# Patient Record
Sex: Female | Born: 1935 | Race: Black or African American | Hispanic: No | Marital: Single | Smoking: Never smoker
Health system: Southern US, Community
[De-identification: ages and names within clinical notes are randomized; demographics above are authoritative.]

## PROBLEM LIST (undated history)

## (undated) DIAGNOSIS — M810 Age-related osteoporosis without current pathological fracture: Secondary | ICD-10-CM

## (undated) DIAGNOSIS — C801 Malignant (primary) neoplasm, unspecified: Secondary | ICD-10-CM

## (undated) DIAGNOSIS — I10 Essential (primary) hypertension: Secondary | ICD-10-CM

## (undated) DIAGNOSIS — M1990 Unspecified osteoarthritis, unspecified site: Secondary | ICD-10-CM

## (undated) DIAGNOSIS — M25569 Pain in unspecified knee: Secondary | ICD-10-CM

## (undated) DIAGNOSIS — E785 Hyperlipidemia, unspecified: Secondary | ICD-10-CM

## (undated) DIAGNOSIS — D509 Iron deficiency anemia, unspecified: Secondary | ICD-10-CM

## (undated) DIAGNOSIS — K219 Gastro-esophageal reflux disease without esophagitis: Secondary | ICD-10-CM

## (undated) DIAGNOSIS — D1721 Benign lipomatous neoplasm of skin and subcutaneous tissue of right arm: Secondary | ICD-10-CM

## (undated) DIAGNOSIS — C189 Malignant neoplasm of colon, unspecified: Principal | ICD-10-CM

## (undated) HISTORY — PX: COLON SURGERY: SHX602

## (undated) HISTORY — DX: Pain in unspecified knee: M25.569

## (undated) HISTORY — DX: Benign lipomatous neoplasm of skin and subcutaneous tissue of right arm: D17.21

## (undated) HISTORY — DX: Iron deficiency anemia, unspecified: D50.9

## (undated) HISTORY — PX: CATARACT EXTRACTION: SUR2

## (undated) HISTORY — PX: OTHER SURGICAL HISTORY: SHX169

## (undated) HISTORY — PX: APPENDECTOMY: SHX54

## (undated) HISTORY — DX: Malignant neoplasm of colon, unspecified: C18.9

## (undated) HISTORY — PX: ABDOMINAL HYSTERECTOMY: SHX81

---

## 2000-04-13 ENCOUNTER — Other Ambulatory Visit: Admission: RE | Admit: 2000-04-13 | Discharge: 2000-04-13 | Payer: Self-pay | Admitting: Family Medicine

## 2000-04-19 ENCOUNTER — Encounter: Payer: Self-pay | Admitting: Family Medicine

## 2000-04-19 ENCOUNTER — Ambulatory Visit (HOSPITAL_COMMUNITY): Admission: RE | Admit: 2000-04-19 | Discharge: 2000-04-19 | Payer: Self-pay | Admitting: Family Medicine

## 2001-03-28 ENCOUNTER — Ambulatory Visit (HOSPITAL_COMMUNITY): Admission: RE | Admit: 2001-03-28 | Discharge: 2001-03-28 | Payer: Self-pay | Admitting: Internal Medicine

## 2001-08-03 ENCOUNTER — Encounter: Payer: Self-pay | Admitting: Family Medicine

## 2001-08-03 ENCOUNTER — Ambulatory Visit (HOSPITAL_COMMUNITY): Admission: RE | Admit: 2001-08-03 | Discharge: 2001-08-03 | Payer: Self-pay | Admitting: Family Medicine

## 2001-12-16 ENCOUNTER — Emergency Department (HOSPITAL_COMMUNITY): Admission: EM | Admit: 2001-12-16 | Discharge: 2001-12-16 | Payer: Self-pay | Admitting: Internal Medicine

## 2001-12-16 ENCOUNTER — Encounter: Payer: Self-pay | Admitting: Internal Medicine

## 2002-01-16 ENCOUNTER — Ambulatory Visit (HOSPITAL_COMMUNITY): Admission: RE | Admit: 2002-01-16 | Discharge: 2002-01-16 | Payer: Self-pay | Admitting: Ophthalmology

## 2002-01-26 ENCOUNTER — Ambulatory Visit (HOSPITAL_COMMUNITY): Admission: RE | Admit: 2002-01-26 | Discharge: 2002-01-26 | Payer: Self-pay | Admitting: General Surgery

## 2002-02-13 ENCOUNTER — Ambulatory Visit (HOSPITAL_COMMUNITY): Admission: RE | Admit: 2002-02-13 | Discharge: 2002-02-13 | Payer: Self-pay | Admitting: Ophthalmology

## 2004-04-23 ENCOUNTER — Emergency Department (HOSPITAL_COMMUNITY): Admission: EM | Admit: 2004-04-23 | Discharge: 2004-04-23 | Payer: Self-pay | Admitting: Emergency Medicine

## 2004-06-08 ENCOUNTER — Emergency Department (HOSPITAL_COMMUNITY): Admission: EM | Admit: 2004-06-08 | Discharge: 2004-06-08 | Payer: Self-pay | Admitting: Emergency Medicine

## 2004-12-20 ENCOUNTER — Emergency Department (HOSPITAL_COMMUNITY): Admission: EM | Admit: 2004-12-20 | Discharge: 2004-12-20 | Payer: Self-pay | Admitting: Emergency Medicine

## 2006-03-27 ENCOUNTER — Emergency Department (HOSPITAL_COMMUNITY): Admission: EM | Admit: 2006-03-27 | Discharge: 2006-03-27 | Payer: Self-pay | Admitting: Emergency Medicine

## 2006-04-04 ENCOUNTER — Ambulatory Visit (HOSPITAL_COMMUNITY): Admission: RE | Admit: 2006-04-04 | Discharge: 2006-04-04 | Payer: Self-pay | Admitting: Orthopaedic Surgery

## 2006-10-07 ENCOUNTER — Ambulatory Visit: Payer: Self-pay | Admitting: Family Medicine

## 2006-10-07 DIAGNOSIS — E785 Hyperlipidemia, unspecified: Secondary | ICD-10-CM

## 2006-10-07 DIAGNOSIS — M129 Arthropathy, unspecified: Secondary | ICD-10-CM | POA: Insufficient documentation

## 2006-10-07 DIAGNOSIS — N318 Other neuromuscular dysfunction of bladder: Secondary | ICD-10-CM | POA: Insufficient documentation

## 2006-10-07 HISTORY — DX: Arthropathy, unspecified: M12.9

## 2006-10-11 ENCOUNTER — Ambulatory Visit (HOSPITAL_COMMUNITY): Admission: RE | Admit: 2006-10-11 | Discharge: 2006-10-11 | Payer: Self-pay | Admitting: Family Medicine

## 2006-10-12 ENCOUNTER — Encounter (INDEPENDENT_AMBULATORY_CARE_PROVIDER_SITE_OTHER): Payer: Self-pay | Admitting: Family Medicine

## 2006-10-13 ENCOUNTER — Telehealth (INDEPENDENT_AMBULATORY_CARE_PROVIDER_SITE_OTHER): Payer: Self-pay | Admitting: *Deleted

## 2006-10-13 LAB — CONVERTED CEMR LAB
ALT: 15 units/L (ref 0–35)
Albumin: 4.2 g/dL (ref 3.5–5.2)
Alkaline Phosphatase: 74 units/L (ref 39–117)
Calcium: 9.4 mg/dL (ref 8.4–10.5)
Chloride: 104 meq/L (ref 96–112)
Cholesterol: 305 mg/dL — ABNORMAL HIGH (ref 0–200)
Eosinophils Absolute: 0.2 10*3/uL (ref 0.0–0.7)
Eosinophils Relative: 4 % (ref 0–5)
Lymphocytes Relative: 40 % (ref 12–46)
MCHC: 31.1 g/dL (ref 30.0–36.0)
Monocytes Relative: 9 % (ref 3–11)
Neutrophils Relative %: 46 % (ref 43–77)
Potassium: 4.5 meq/L (ref 3.5–5.3)
RBC: 4.72 M/uL (ref 3.87–5.11)
RDW: 14.3 % — ABNORMAL HIGH (ref 11.5–14.0)
Total CHOL/HDL Ratio: 4.1
Triglycerides: 116 mg/dL (ref <150)
VLDL: 23 mg/dL (ref 0–40)
WBC: 4.3 10*3/uL (ref 4.0–10.5)

## 2006-10-31 ENCOUNTER — Ambulatory Visit: Payer: Self-pay | Admitting: Family Medicine

## 2006-10-31 LAB — CONVERTED CEMR LAB: HDL goal, serum: 40 mg/dL

## 2006-11-03 ENCOUNTER — Ambulatory Visit (HOSPITAL_COMMUNITY): Admission: RE | Admit: 2006-11-03 | Discharge: 2006-11-03 | Payer: Self-pay | Admitting: Family Medicine

## 2006-11-03 ENCOUNTER — Encounter (INDEPENDENT_AMBULATORY_CARE_PROVIDER_SITE_OTHER): Payer: Self-pay | Admitting: Family Medicine

## 2006-11-03 LAB — CONVERTED CEMR LAB

## 2006-11-04 ENCOUNTER — Telehealth (INDEPENDENT_AMBULATORY_CARE_PROVIDER_SITE_OTHER): Payer: Self-pay | Admitting: *Deleted

## 2006-11-07 ENCOUNTER — Telehealth (INDEPENDENT_AMBULATORY_CARE_PROVIDER_SITE_OTHER): Payer: Self-pay | Admitting: *Deleted

## 2006-12-12 ENCOUNTER — Ambulatory Visit: Payer: Self-pay | Admitting: Family Medicine

## 2006-12-12 DIAGNOSIS — M810 Age-related osteoporosis without current pathological fracture: Secondary | ICD-10-CM

## 2006-12-13 ENCOUNTER — Telehealth (INDEPENDENT_AMBULATORY_CARE_PROVIDER_SITE_OTHER): Payer: Self-pay | Admitting: *Deleted

## 2006-12-13 LAB — CONVERTED CEMR LAB
AST: 20 units/L (ref 0–37)
Albumin: 4.1 g/dL (ref 3.5–5.2)
Alkaline Phosphatase: 76 units/L (ref 39–117)
CO2: 24 meq/L (ref 19–32)
Calcium: 9.2 mg/dL (ref 8.4–10.5)
Creatinine, Ser: 0.68 mg/dL (ref 0.40–1.20)
Total Bilirubin: 0.4 mg/dL (ref 0.3–1.2)
Total Protein: 7.2 g/dL (ref 6.0–8.3)

## 2007-02-02 ENCOUNTER — Ambulatory Visit: Payer: Self-pay | Admitting: Family Medicine

## 2007-03-02 ENCOUNTER — Encounter (INDEPENDENT_AMBULATORY_CARE_PROVIDER_SITE_OTHER): Payer: Self-pay | Admitting: Family Medicine

## 2007-03-03 LAB — CONVERTED CEMR LAB
ALT: 16 units/L (ref 0–35)
Albumin: 4.1 g/dL (ref 3.5–5.2)
Bilirubin, Direct: 0.1 mg/dL (ref 0.0–0.3)
Cholesterol: 215 mg/dL — ABNORMAL HIGH (ref 0–200)
HDL: 77 mg/dL (ref >39)
Indirect Bilirubin: 0.2 mg/dL (ref 0.0–0.9)
VLDL: 19 mg/dL (ref 0–40)

## 2007-04-27 ENCOUNTER — Ambulatory Visit: Payer: Self-pay | Admitting: Family Medicine

## 2007-05-25 ENCOUNTER — Ambulatory Visit: Payer: Self-pay | Admitting: Family Medicine

## 2007-08-01 ENCOUNTER — Telehealth (INDEPENDENT_AMBULATORY_CARE_PROVIDER_SITE_OTHER): Payer: Self-pay | Admitting: *Deleted

## 2007-10-18 ENCOUNTER — Ambulatory Visit: Payer: Self-pay | Admitting: Internal Medicine

## 2007-11-23 ENCOUNTER — Ambulatory Visit: Payer: Self-pay | Admitting: Family Medicine

## 2007-12-04 ENCOUNTER — Ambulatory Visit (HOSPITAL_COMMUNITY): Admission: RE | Admit: 2007-12-04 | Discharge: 2007-12-04 | Payer: Self-pay | Admitting: Family Medicine

## 2007-12-07 ENCOUNTER — Encounter (HOSPITAL_COMMUNITY): Admission: RE | Admit: 2007-12-07 | Discharge: 2008-01-04 | Payer: Self-pay | Admitting: Family Medicine

## 2007-12-08 ENCOUNTER — Ambulatory Visit: Payer: Self-pay | Admitting: Family Medicine

## 2007-12-12 ENCOUNTER — Ambulatory Visit: Payer: Self-pay | Admitting: Family Medicine

## 2007-12-12 ENCOUNTER — Ambulatory Visit (HOSPITAL_COMMUNITY): Admission: RE | Admit: 2007-12-12 | Discharge: 2007-12-12 | Payer: Self-pay | Admitting: Family Medicine

## 2007-12-12 DIAGNOSIS — M25519 Pain in unspecified shoulder: Secondary | ICD-10-CM

## 2008-01-09 ENCOUNTER — Encounter (HOSPITAL_COMMUNITY): Admission: RE | Admit: 2008-01-09 | Discharge: 2008-01-17 | Payer: Self-pay | Admitting: Family Medicine

## 2008-01-19 ENCOUNTER — Ambulatory Visit: Payer: Self-pay | Admitting: Family Medicine

## 2008-01-19 DIAGNOSIS — I10 Essential (primary) hypertension: Secondary | ICD-10-CM | POA: Insufficient documentation

## 2008-01-19 LAB — CONVERTED CEMR LAB
Bilirubin Urine: NEGATIVE
pH: 5

## 2008-01-22 ENCOUNTER — Encounter (INDEPENDENT_AMBULATORY_CARE_PROVIDER_SITE_OTHER): Payer: Self-pay | Admitting: Family Medicine

## 2008-01-22 LAB — CONVERTED CEMR LAB
AST: 21 units/L (ref 0–37)
Albumin: 4 g/dL (ref 3.5–5.2)
Alkaline Phosphatase: 50 units/L (ref 39–117)
BUN: 20 mg/dL (ref 6–23)
Basophils Relative: 1 % (ref 0–1)
CO2: 22 meq/L (ref 19–32)
Chloride: 108 meq/L (ref 96–112)
Glucose, Bld: 82 mg/dL (ref 70–99)
HCT: 40.1 % (ref 36.0–46.0)
Hemoglobin: 12.6 g/dL (ref 12.0–15.0)
Lymphs Abs: 2.4 10*3/uL (ref 0.7–4.0)
Monocytes Absolute: 0.4 10*3/uL (ref 0.1–1.0)
Monocytes Relative: 7 % (ref 3–12)
Neutro Abs: 3.1 10*3/uL (ref 1.7–7.7)
Neutrophils Relative %: 51 % (ref 43–77)
Platelets: 318 10*3/uL (ref 150–400)
RDW: 14.3 % (ref 11.5–15.5)
Total Bilirubin: 0.3 mg/dL (ref 0.3–1.2)
Total CHOL/HDL Ratio: 3.6

## 2008-01-23 ENCOUNTER — Encounter (HOSPITAL_COMMUNITY): Admission: RE | Admit: 2008-01-23 | Discharge: 2008-02-22 | Payer: Self-pay | Admitting: Family Medicine

## 2008-03-01 ENCOUNTER — Ambulatory Visit: Payer: Self-pay | Admitting: Family Medicine

## 2008-03-01 DIAGNOSIS — M48061 Spinal stenosis, lumbar region without neurogenic claudication: Secondary | ICD-10-CM

## 2008-03-01 DIAGNOSIS — K5900 Constipation, unspecified: Secondary | ICD-10-CM | POA: Insufficient documentation

## 2008-03-01 LAB — CONVERTED CEMR LAB
Nitrite: NEGATIVE
Protein, U semiquant: 30
Urobilinogen, UA: 0.2
pH: 5.5

## 2008-03-05 ENCOUNTER — Encounter (INDEPENDENT_AMBULATORY_CARE_PROVIDER_SITE_OTHER): Payer: Self-pay | Admitting: Family Medicine

## 2008-03-14 ENCOUNTER — Encounter (INDEPENDENT_AMBULATORY_CARE_PROVIDER_SITE_OTHER): Payer: Self-pay | Admitting: Family Medicine

## 2008-04-03 ENCOUNTER — Ambulatory Visit: Payer: Self-pay | Admitting: Family Medicine

## 2008-05-15 ENCOUNTER — Ambulatory Visit: Payer: Self-pay | Admitting: Family Medicine

## 2008-05-16 ENCOUNTER — Encounter (INDEPENDENT_AMBULATORY_CARE_PROVIDER_SITE_OTHER): Payer: Self-pay | Admitting: *Deleted

## 2008-05-17 ENCOUNTER — Ambulatory Visit (HOSPITAL_COMMUNITY): Admission: RE | Admit: 2008-05-17 | Discharge: 2008-05-17 | Payer: Self-pay | Admitting: Family Medicine

## 2008-06-26 ENCOUNTER — Ambulatory Visit: Payer: Self-pay | Admitting: Family Medicine

## 2008-06-27 ENCOUNTER — Encounter (INDEPENDENT_AMBULATORY_CARE_PROVIDER_SITE_OTHER): Payer: Self-pay | Admitting: Family Medicine

## 2008-06-27 LAB — CONVERTED CEMR LAB
ALT: 19 units/L (ref 0–35)
Albumin: 3.9 g/dL (ref 3.5–5.2)
Total Bilirubin: 0.3 mg/dL (ref 0.3–1.2)

## 2008-08-08 ENCOUNTER — Emergency Department (HOSPITAL_COMMUNITY): Admission: EM | Admit: 2008-08-08 | Discharge: 2008-08-08 | Payer: Self-pay | Admitting: Emergency Medicine

## 2008-08-12 ENCOUNTER — Ambulatory Visit: Payer: Self-pay | Admitting: Family Medicine

## 2008-08-12 DIAGNOSIS — M25569 Pain in unspecified knee: Secondary | ICD-10-CM | POA: Insufficient documentation

## 2008-08-14 ENCOUNTER — Encounter (INDEPENDENT_AMBULATORY_CARE_PROVIDER_SITE_OTHER): Payer: Self-pay | Admitting: Family Medicine

## 2008-08-15 ENCOUNTER — Ambulatory Visit: Payer: Self-pay | Admitting: Family Medicine

## 2008-08-15 DIAGNOSIS — R413 Other amnesia: Secondary | ICD-10-CM

## 2008-08-15 LAB — CONVERTED CEMR LAB
Alkaline Phosphatase: 46 units/L (ref 39–117)
CO2: 22 meq/L (ref 19–32)
Calcium: 9 mg/dL (ref 8.4–10.5)
Chloride: 112 meq/L (ref 96–112)
Cholesterol: 239 mg/dL — ABNORMAL HIGH (ref 0–200)
Glucose, Bld: 88 mg/dL (ref 70–99)
Potassium: 3.8 meq/L (ref 3.5–5.3)
Total CHOL/HDL Ratio: 3.2
Total Protein: 6.9 g/dL (ref 6.0–8.3)

## 2008-08-28 ENCOUNTER — Encounter (INDEPENDENT_AMBULATORY_CARE_PROVIDER_SITE_OTHER): Payer: Self-pay | Admitting: Family Medicine

## 2008-09-03 ENCOUNTER — Encounter (INDEPENDENT_AMBULATORY_CARE_PROVIDER_SITE_OTHER): Payer: Self-pay | Admitting: Family Medicine

## 2008-09-04 ENCOUNTER — Ambulatory Visit: Payer: Self-pay | Admitting: Family Medicine

## 2008-09-18 ENCOUNTER — Encounter (HOSPITAL_COMMUNITY): Admission: RE | Admit: 2008-09-18 | Discharge: 2008-10-03 | Payer: Self-pay | Admitting: Family Medicine

## 2008-10-07 ENCOUNTER — Encounter (INDEPENDENT_AMBULATORY_CARE_PROVIDER_SITE_OTHER): Payer: Self-pay | Admitting: Family Medicine

## 2008-10-07 ENCOUNTER — Encounter (HOSPITAL_COMMUNITY): Admission: RE | Admit: 2008-10-07 | Discharge: 2008-11-06 | Payer: Self-pay | Admitting: Family Medicine

## 2009-03-18 ENCOUNTER — Ambulatory Visit (HOSPITAL_COMMUNITY): Admission: RE | Admit: 2009-03-18 | Discharge: 2009-03-18 | Payer: Self-pay | Admitting: Internal Medicine

## 2009-03-20 ENCOUNTER — Emergency Department (HOSPITAL_COMMUNITY): Admission: EM | Admit: 2009-03-20 | Discharge: 2009-03-20 | Payer: Self-pay | Admitting: Emergency Medicine

## 2009-03-25 ENCOUNTER — Ambulatory Visit (HOSPITAL_COMMUNITY): Admission: RE | Admit: 2009-03-25 | Discharge: 2009-03-25 | Payer: Self-pay | Admitting: Internal Medicine

## 2009-04-12 ENCOUNTER — Emergency Department (HOSPITAL_COMMUNITY): Admission: EM | Admit: 2009-04-12 | Discharge: 2009-04-12 | Payer: Self-pay | Admitting: Emergency Medicine

## 2010-01-25 ENCOUNTER — Encounter: Payer: Self-pay | Admitting: Emergency Medicine

## 2010-02-01 LAB — CONVERTED CEMR LAB
Bilirubin Urine: NEGATIVE
Blood in Urine, dipstick: NEGATIVE
Ketones, urine, test strip: NEGATIVE
Nitrite: NEGATIVE
Specific Gravity, Urine: >=1.03
pH: 5.5

## 2010-03-25 LAB — URINALYSIS, ROUTINE W REFLEX MICROSCOPIC
Bilirubin Urine: NEGATIVE
Glucose, UA: NEGATIVE mg/dL
Hgb urine dipstick: NEGATIVE
Nitrite: NEGATIVE
Specific Gravity, Urine: 1.025 (ref 1.005–1.030)

## 2010-03-25 LAB — URINE CULTURE: Colony Count: 15000

## 2010-03-27 LAB — DIFFERENTIAL
Basophils Absolute: 0 10*3/uL (ref 0.0–0.1)
Basophils Relative: 1 % (ref 0–1)
Eosinophils Absolute: 0.1 10*3/uL (ref 0.0–0.7)
Eosinophils Relative: 2 % (ref 0–5)
Monocytes Absolute: 0.4 10*3/uL (ref 0.1–1.0)
Neutro Abs: 4.3 10*3/uL (ref 1.7–7.7)

## 2010-03-27 LAB — URINALYSIS, ROUTINE W REFLEX MICROSCOPIC
Glucose, UA: NEGATIVE mg/dL
Hgb urine dipstick: NEGATIVE
Ketones, ur: NEGATIVE mg/dL
Nitrite: NEGATIVE
Protein, ur: 30 mg/dL — AB
Urobilinogen, UA: 0.2 mg/dL (ref 0.0–1.0)
pH: 7 (ref 5.0–8.0)

## 2010-03-27 LAB — CBC
Hemoglobin: 12.1 g/dL (ref 12.0–15.0)
MCHC: 33.4 g/dL (ref 30.0–36.0)
RBC: 4.21 MIL/uL (ref 3.87–5.11)

## 2010-03-27 LAB — URINE MICROSCOPIC-ADD ON

## 2010-03-27 LAB — COMPREHENSIVE METABOLIC PANEL
ALT: 19 U/L (ref 0–35)
Albumin: 3.8 g/dL (ref 3.5–5.2)
Alkaline Phosphatase: 65 U/L (ref 39–117)
BUN: 10 mg/dL (ref 6–23)
Chloride: 103 mEq/L (ref 96–112)
Potassium: 4 mEq/L (ref 3.5–5.1)
Total Bilirubin: 0.5 mg/dL (ref 0.3–1.2)

## 2010-04-12 ENCOUNTER — Emergency Department (HOSPITAL_COMMUNITY)
Admission: EM | Admit: 2010-04-12 | Discharge: 2010-04-12 | Disposition: A | Discharge status: Home / Self Care | Payer: Medicare Other | Attending: Emergency Medicine | Admitting: Emergency Medicine

## 2010-04-12 DIAGNOSIS — K029 Dental caries, unspecified: Secondary | ICD-10-CM | POA: Insufficient documentation

## 2010-04-12 DIAGNOSIS — I10 Essential (primary) hypertension: Secondary | ICD-10-CM | POA: Insufficient documentation

## 2010-04-12 DIAGNOSIS — M1710 Unilateral primary osteoarthritis, unspecified knee: Secondary | ICD-10-CM | POA: Insufficient documentation

## 2010-04-12 DIAGNOSIS — K089 Disorder of teeth and supporting structures, unspecified: Secondary | ICD-10-CM | POA: Insufficient documentation

## 2010-04-12 DIAGNOSIS — Z7982 Long term (current) use of aspirin: Secondary | ICD-10-CM | POA: Insufficient documentation

## 2010-05-22 NOTE — Op Note (Signed)
   [name], Sandra Williams                      ACCOUNT NO.:  [id number]   MEDICAL RECORD NO.:  [id number]                   PATIENT TYPE:  AMB   LOCATION:  DAY                                  FACILITY:  [facility]   PHYSICIAN:  Jerolyn Shin C. Katrinka Blazing, M.D.                DATE OF BIRTH:  [birth year]   DATE OF PROCEDURE:  DATE OF DISCHARGE:                                 OPERATIVE REPORT   PREOPERATIVE DIAGNOSIS:   POSTOPERATIVE DIAGNOSIS:   PROCEDURE:   SURGEON:  Leroy C. Katrinka Blazing, M.D.   DESCRIPTION OF PROCEDURE:  The patient was taken to the minor procedure  room.  Her left breast and chest were prepped and draped in a sterile field.  Local infiltration around the mass in the breast was carried out with 1%  Xylocaine.  The mass overlying the skin was excised down to normal appearing  tissue.  The tissues were closed with interrupted 3-0 Biosyn.  Skin was  closed with interrupted 3-0 Prolene.  A dressing was placed.  The patient  tolerated the procedure well.  She was transferred to the day surgery area  for discharge home.  She is advised to keep her dressing on for 3 days and  then cover up the wound with a large band-aid dressing.  She was given  Lortab 5 mg 4 times daily as needed for pain. I will see her in the office  in 10 days.                                               Dirk Dress. Katrinka Blazing, M.D.    LCS/MEDQ  D:  01/26/2002  T:  01/26/2002  Job:  [id number]

## 2010-05-22 NOTE — H&P (Signed)
   NAMEKARY, SUGRUE                      ACCOUNT NO.:  [id number]   MEDICAL RECORD NO.:  [id number]                   PATIENT TYPE:   LOCATION:                                       FACILITY:  [facility]   PHYSICIAN:  Dirk Dress. Katrinka Blazing, M.D.                DATE OF BIRTH:  September 22, 1925   DATE OF ADMISSION:  01/26/2002  DATE OF DISCHARGE:                                HISTORY & PHYSICAL   HISTORY OF PRESENT ILLNESS:  [age]-year-old female with history of  growing mass of the medial aspect of the left breast.  The mass has  gradually increased in size.  She has not had any drainage or erythema.  The  patient is scheduled to have the mass excised under local anesthesia.   PAST HISTORY:  She has hypertension and hyperlipidemia.   MEDICATIONS:  1. Hydrochlorothiazide 25 mg daily.  2. Kay Ciel 20 mEq daily.  3. Lipitor 20 mg q.h.s.  4. Aspirin 81 mg daily.   REVIEW OF SYSTEMS:  Review of systems is positive for headache, joint pain.   ALLERGIES:  She has no known drug allergies.   PHYSICAL EXAMINATION:  VITAL SIGNS:  On examination, blood pressure 160/80,  pulse 80, respirations 18.  Weight 149 pounds.  HEENT:  Unremarkable.  NECK:  Neck supple.  No JVD or bruit.  CHEST:  Chest clear to auscultation.  No rales, rubs, rhonchi or wheezes.  HEART:  Regular rate and rhythm without murmur, gallop or rub.  ABDOMEN:  Abdomen soft, nontender.  No masses.  BREASTS:  Nonfixed nodule, left breast, in the left inner-lower quadrant.  Axillae normal.  EXTREMITIES:  No cyanosis, clubbing or edema.  NEUROLOGIC:  No focal motor, sensory or cerebellar deficit.   IMPRESSION:  1. Left breast mass.  2. Hypertension.  3. Hyperlipidemia.   PLAN:  Excision of mass in day surgery under local anesthesia.                                              Dirk Dress. Katrinka Blazing, M.D.   LCS/MEDQ  D:  01/25/2002  T:  01/26/2002  Job:  [id number]

## 2011-03-03 DIAGNOSIS — J40 Bronchitis, not specified as acute or chronic: Secondary | ICD-10-CM | POA: Diagnosis not present

## 2011-06-15 DIAGNOSIS — I10 Essential (primary) hypertension: Secondary | ICD-10-CM | POA: Diagnosis not present

## 2011-06-15 DIAGNOSIS — E7800 Pure hypercholesterolemia, unspecified: Secondary | ICD-10-CM | POA: Diagnosis not present

## 2011-09-14 DIAGNOSIS — I10 Essential (primary) hypertension: Secondary | ICD-10-CM | POA: Diagnosis not present

## 2011-09-14 DIAGNOSIS — E7800 Pure hypercholesterolemia, unspecified: Secondary | ICD-10-CM | POA: Diagnosis not present

## 2011-09-14 DIAGNOSIS — R5381 Other malaise: Secondary | ICD-10-CM | POA: Diagnosis not present

## 2011-09-14 DIAGNOSIS — D649 Anemia, unspecified: Secondary | ICD-10-CM | POA: Diagnosis not present

## 2011-09-21 DIAGNOSIS — R5381 Other malaise: Secondary | ICD-10-CM | POA: Diagnosis not present

## 2011-09-21 DIAGNOSIS — Z23 Encounter for immunization: Secondary | ICD-10-CM | POA: Diagnosis not present

## 2011-09-21 DIAGNOSIS — D649 Anemia, unspecified: Secondary | ICD-10-CM | POA: Diagnosis not present

## 2011-09-21 DIAGNOSIS — E7800 Pure hypercholesterolemia, unspecified: Secondary | ICD-10-CM | POA: Diagnosis not present

## 2011-09-21 DIAGNOSIS — I10 Essential (primary) hypertension: Secondary | ICD-10-CM | POA: Diagnosis not present

## 2011-09-23 ENCOUNTER — Inpatient Hospital Stay (HOSPITAL_COMMUNITY)
Admission: EM | Admit: 2011-09-23 | Discharge: 2011-09-25 | DRG: 379 | Disposition: A | Discharge status: Home / Self Care | Payer: Medicare Other | Attending: Internal Medicine | Admitting: Internal Medicine

## 2011-09-23 ENCOUNTER — Encounter (HOSPITAL_COMMUNITY): Payer: Self-pay | Admitting: *Deleted

## 2011-09-23 DIAGNOSIS — Z90710 Acquired absence of both cervix and uterus: Secondary | ICD-10-CM

## 2011-09-23 DIAGNOSIS — Z7982 Long term (current) use of aspirin: Secondary | ICD-10-CM | POA: Diagnosis not present

## 2011-09-23 DIAGNOSIS — K922 Gastrointestinal hemorrhage, unspecified: Secondary | ICD-10-CM | POA: Diagnosis not present

## 2011-09-23 DIAGNOSIS — K639 Disease of intestine, unspecified: Secondary | ICD-10-CM | POA: Diagnosis present

## 2011-09-23 DIAGNOSIS — M810 Age-related osteoporosis without current pathological fracture: Secondary | ICD-10-CM | POA: Diagnosis present

## 2011-09-23 DIAGNOSIS — K5730 Diverticulosis of large intestine without perforation or abscess without bleeding: Secondary | ICD-10-CM | POA: Diagnosis present

## 2011-09-23 DIAGNOSIS — Z79899 Other long term (current) drug therapy: Secondary | ICD-10-CM | POA: Diagnosis not present

## 2011-09-23 DIAGNOSIS — D371 Neoplasm of uncertain behavior of stomach: Secondary | ICD-10-CM | POA: Diagnosis present

## 2011-09-23 DIAGNOSIS — D500 Iron deficiency anemia secondary to blood loss (chronic): Secondary | ICD-10-CM | POA: Diagnosis present

## 2011-09-23 DIAGNOSIS — M129 Arthropathy, unspecified: Secondary | ICD-10-CM | POA: Diagnosis present

## 2011-09-23 DIAGNOSIS — E785 Hyperlipidemia, unspecified: Secondary | ICD-10-CM | POA: Diagnosis present

## 2011-09-23 DIAGNOSIS — Z9089 Acquired absence of other organs: Secondary | ICD-10-CM | POA: Diagnosis not present

## 2011-09-23 DIAGNOSIS — D509 Iron deficiency anemia, unspecified: Secondary | ICD-10-CM | POA: Diagnosis not present

## 2011-09-23 DIAGNOSIS — D649 Anemia, unspecified: Secondary | ICD-10-CM | POA: Diagnosis not present

## 2011-09-23 DIAGNOSIS — R195 Other fecal abnormalities: Secondary | ICD-10-CM | POA: Diagnosis not present

## 2011-09-23 DIAGNOSIS — I10 Essential (primary) hypertension: Secondary | ICD-10-CM | POA: Diagnosis present

## 2011-09-23 DIAGNOSIS — R1900 Intra-abdominal and pelvic swelling, mass and lump, unspecified site: Secondary | ICD-10-CM | POA: Diagnosis not present

## 2011-09-23 DIAGNOSIS — K648 Other hemorrhoids: Secondary | ICD-10-CM | POA: Diagnosis present

## 2011-09-23 DIAGNOSIS — C189 Malignant neoplasm of colon, unspecified: Secondary | ICD-10-CM | POA: Diagnosis not present

## 2011-09-23 DIAGNOSIS — C180 Malignant neoplasm of cecum: Secondary | ICD-10-CM | POA: Diagnosis not present

## 2011-09-23 DIAGNOSIS — K5909 Other constipation: Secondary | ICD-10-CM | POA: Diagnosis present

## 2011-09-23 DIAGNOSIS — D126 Benign neoplasm of colon, unspecified: Secondary | ICD-10-CM | POA: Diagnosis not present

## 2011-09-23 HISTORY — DX: Hyperlipidemia, unspecified: E78.5

## 2011-09-23 HISTORY — DX: Essential (primary) hypertension: I10

## 2011-09-23 HISTORY — DX: Age-related osteoporosis without current pathological fracture: M81.0

## 2011-09-23 HISTORY — DX: Unspecified osteoarthritis, unspecified site: M19.90

## 2011-09-23 LAB — CBC WITH DIFFERENTIAL/PLATELET
Basophils Absolute: 0 10*3/uL (ref 0.0–0.1)
Basophils Relative: 1 % (ref 0–1)
Eosinophils Absolute: 0.2 10*3/uL (ref 0.0–0.7)
Hemoglobin: 6.9 g/dL — CL (ref 12.0–15.0)
MCHC: 29 g/dL — ABNORMAL LOW (ref 30.0–36.0)
Neutro Abs: 2.8 10*3/uL (ref 1.7–7.7)
Neutrophils Relative %: 53 % (ref 43–77)
Platelets: 381 10*3/uL (ref 150–400)
RDW: 17.3 % — ABNORMAL HIGH (ref 11.5–15.5)

## 2011-09-23 LAB — COMPREHENSIVE METABOLIC PANEL
AST: 25 U/L (ref 0–37)
Albumin: 3.3 g/dL — ABNORMAL LOW (ref 3.5–5.2)
Alkaline Phosphatase: 59 U/L (ref 39–117)
BUN: 15 mg/dL (ref 6–23)
CO2: 22 mEq/L (ref 19–32)
Chloride: 109 mEq/L (ref 96–112)
Creatinine, Ser: 0.67 mg/dL (ref 0.50–1.10)
GFR calc non Af Amer: 79 mL/min — ABNORMAL LOW (ref >90)
Potassium: 4 mEq/L (ref 3.5–5.1)
Total Bilirubin: 0.2 mg/dL — ABNORMAL LOW (ref 0.3–1.2)

## 2011-09-23 LAB — OCCULT BLOOD, POC DEVICE: Fecal Occult Bld: POSITIVE

## 2011-09-23 LAB — PREPARE RBC (CROSSMATCH)

## 2011-09-23 MED ORDER — SODIUM CHLORIDE 0.9 % IJ SOLN
INTRAMUSCULAR | Status: AC
  Administered 2011-09-23: 3 mL
  Filled 2011-09-23: qty 3

## 2011-09-23 MED ORDER — PEG 3350-KCL-NABCB-NACL-NASULF 236 G PO SOLR
4000.0000 mL | Freq: Once | ORAL | Status: AC
  Administered 2011-09-23: 4000 mL via ORAL
  Filled 2011-09-23: qty 4000

## 2011-09-23 MED ORDER — LOSARTAN POTASSIUM 50 MG PO TABS
100.0000 mg | ORAL_TABLET | Freq: Every day | ORAL | Status: DC
  Administered 2011-09-25: 100 mg via ORAL
  Filled 2011-09-23: qty 2

## 2011-09-23 MED ORDER — BISACODYL 5 MG PO TBEC
10.0000 mg | DELAYED_RELEASE_TABLET | Freq: Once | ORAL | Status: AC
  Administered 2011-09-23: 10 mg via ORAL
  Filled 2011-09-23: qty 2

## 2011-09-23 MED ORDER — SIMVASTATIN 20 MG PO TABS
20.0000 mg | ORAL_TABLET | Freq: Every day | ORAL | Status: DC
  Administered 2011-09-23: 20 mg via ORAL
  Filled 2011-09-23: qty 1

## 2011-09-23 MED ORDER — PANTOPRAZOLE SODIUM 40 MG PO TBEC
40.0000 mg | DELAYED_RELEASE_TABLET | Freq: Every day | ORAL | Status: DC
  Administered 2011-09-23 – 2011-09-25 (×3): 40 mg via ORAL
  Filled 2011-09-23 (×3): qty 1

## 2011-09-23 NOTE — ED Provider Notes (Signed)
History   This chart was scribed for Charles B. Bernette Mayers, MD by Sofie Rower. The patient was seen in room [facility] and the patient's care was started at 1:36PM.   CSN: [id number]  Arrival date & time 09/23/11  1316   First MD Initiated Contact with Patient 09/23/11 1336      Chief Complaint  Patient presents with  . Anemia    (Consider location/radiation/quality/duration/timing/severity/associated sxs/prior Treatment)  Patient is a 78 y.o. female presenting with anemia. The history is provided by the patient. No language interpreter was used.  Anemia  Sandra Williams is a 78 y.o. female  who presents to the Emergency Department for evaluation of anemia found on blood work done at PCP office yesterday. The pt reports she was referred to [facility] today by Dr. Felecia Shelling, due to her evaluation of blood work taken yesterday (09/22/11), while she was visiting for her regularly scheduled appointment. She denies any CP, SOB, weakness, nausea or vomiting. No rectal bleeding but did report a dark stool two days ago. The pt reports she takes one baby aspirin per day but denies any other NSAID use. The pt has a hx of hypertension and high cholesterol. The pt denies CP, SOB, dizziness, and weakness.   The pt does not smoke or drink alcohol.   PCP is Dr. Felecia Shelling.    Past Medical History  Diagnosis Date  . Osteoporosis     Past Surgical History  Procedure Date  . Abdominal hysterectomy   . Appendectomy   . Cataract extraction   . Cesarean section     History reviewed. No pertinent family history.  History  Substance Use Topics  . Smoking status: Never Smoker   . Smokeless tobacco: Not on file  . Alcohol Use: No    OB History    Grav Para Term Preterm Abortions TAB SAB Ect Mult Living                  Review of Systems  All other systems reviewed and are negative.    Allergies  Aspirin  Home Medications  No current outpatient prescriptions on file.  BP 147/63  Pulse 92  Temp  98.3 F (36.8 C) (Oral)  Resp 20  Ht 5\' 2"  (1.575 m)  Wt 130 lb (58.968 kg)  BMI 23.78 kg/m2  SpO2 99%  Physical Exam  Nursing note and vitals reviewed. Constitutional: She is oriented to person, place, and time. She appears well-developed and well-nourished.  HENT:  Head: Normocephalic and atraumatic.  Eyes: EOM are normal. Pupils are equal, round, and reactive to light.  Neck: Normal range of motion. Neck supple.  Cardiovascular: Normal rate, normal heart sounds and intact distal pulses.   Pulmonary/Chest: Effort normal and breath sounds normal.  Abdominal: Bowel sounds are normal. She exhibits no distension. There is no tenderness.  Genitourinary: Guaiac positive stool.       Rectal exam performed. Chaperone present. Red blood detected.  Musculoskeletal: Normal range of motion. She exhibits no edema and no tenderness.  Neurological: She is alert and oriented to person, place, and time. She has normal strength. No cranial nerve deficit or sensory deficit.  Skin: Skin is warm and dry. No rash noted.  Psychiatric: She has a normal mood and affect.    ED Course  Procedures (including critical care time)  DIAGNOSTIC STUDIES: Oxygen Saturation is 99% on room air, normal by my interpretation.    COORDINATION OF CARE:    1:47PM- Blood transfusion, stool sample, and treatment  plan discussed with patient. Pt agrees with treatment.   1:48PM- Rectal exam conducted. Chaperone present.  1:50PM- Transfusion discussed with patient. Pt agrees to treatment.     Labs Reviewed  OCCULT BLOOD, POC DEVICE  CBC WITH DIFFERENTIAL  TYPE AND SCREEN  PROTIME-INR  APTT  COMPREHENSIVE METABOLIC PANEL   No results found.   1. GI bleed   2. Anemia       MDM  Pt with anemia, Hgb 7.6 from Dr. Letitia Neri office. Discussed with Dr. Felecia Shelling who will admit, and Dr. Darrick Penna who will evaluate for possible endoscopy.      I personally performed the services described in the documentation, which  were scribed in my presence. The recorded information has been reviewed and considered.    Charles B. Bernette Mayers, MD 09/23/11 1423

## 2011-09-23 NOTE — Consult Note (Signed)
REVIEWED.  

## 2011-09-23 NOTE — ED Notes (Signed)
CRITICAL VALUE ALERT  Critical value received:  Hgb-6.9, Hct 23.8  Date of notification:  09/23/11  Time of notification:  1445  Critical value read back:yes  Nurse who received alert:  t abbott rn  MD notified (1st page):  sheldon  Time of first page:  1445  MD notified (2nd page):  Time of second page:  Responding MD:    Time MD responded:

## 2011-09-23 NOTE — ED Notes (Signed)
Told to come to ER due to blood "low".  Went to MD office yesterday for routine appt., Pt was called today and told her blood 'was low".  Alert, ambulatory into ER

## 2011-09-23 NOTE — Consult Note (Signed)
Referring Provider: Avon Gully, MD Primary Care Physician:  Avon Gully, MD Primary Gastroenterologist:  Jonette Eva, MD   Reason for Consultation:  Profound anemia, heme positive stool  HPI: Sandra Williams is a 78 y.o. female presented to ED for low hemoglobin detected on routine labs at Dr. Letitia Neri office yesterday. Her hemoglobin on arrival was 6.9. MCV 75.3. Denies prior colonoscopy or EGD. Takes ASA 81mg  daily but no other NSAIDs. Denies heartburn, dysphagia, anorexia, vomiting, weight loss, abdominal pain, melena, diarrhea. H/O chronic constipation. Occasional brbpr on toilet tissue. Only complaint is that of fatigue. No chest pain or DOE.   Prior to Admission medications   Medication Sig Start Date End Date Taking? Authorizing Provider  aspirin EC 81 MG tablet Take 81 mg by mouth daily.   Yes Historical Provider, MD  losartan (COZAAR) 100 MG tablet Take 100 mg by mouth daily.   Yes Historical Provider, MD  pravastatin (PRAVACHOL) 40 MG tablet Take 40 mg by mouth daily.   Yes Historical Provider, MD       Allergies as of 09/23/2011 - Review Complete 09/23/2011  Allergen Reaction Noted  . Aspirin  10/07/2006    Past Medical History  Diagnosis Date  . Osteoporosis   . Arthritis   . HTN (hypertension)   . Hyperlipidemia     Past Surgical History  Procedure Date  . Abdominal hysterectomy     partial  . Appendectomy   . Cataract extraction   . Cesarean section     Family History  Problem Relation Age of Onset  . Breast cancer Daughter   . Colon cancer Neg Hx   . Liver disease Neg Hx     History   Social History  . Marital Status: Single    Spouse Name: N/A    Number of Children: N/A  . Years of Education: N/A   Occupational History  . Not on file.   Social History Main Topics  . Smoking status: Never Smoker   . Smokeless tobacco: Not on file  . Alcohol Use: No  . Drug Use: No  . Sexually Active: No   Other Topics Concern  . Not on file    Social History Narrative  . No narrative on file     ROS:  General: Negative for anorexia, weight loss, fever, chills, weakness. See hpi. Eyes: Negative for vision changes.  ENT: Negative for hoarseness, difficulty swallowing , nasal congestion. CV: Negative for chest pain, angina, palpitations, dyspnea on exertion, peripheral edema.  Respiratory: Negative for dyspnea at rest, dyspnea on exertion, cough, sputum, wheezing.  GI: See history of present illness. GU:  Negative for dysuria, hematuria, urinary incontinence, urinary frequency, nocturnal urination.  MS: Negative for low back pain. H/o knee pain.  Derm: Negative for rash or itching.  Neuro: Negative for weakness, abnormal sensation, seizure, frequent headaches, memory loss, confusion.  Psych: Negative for anxiety, depression, suicidal ideation, hallucinations.  Endo: Negative for unusual weight change.  Heme: Negative for bruising or bleeding. Allergy: Negative for rash or hives.       Physical Examination: Vital signs in last 24 hours: Temp:  [98.3 F (36.8 C)-98.7 F (37.1 C)] 98.7 F (37.1 C) (09/19 1537) Pulse Rate:  [71-92] 71  (09/19 1522) Resp:  [18-20] 18  (09/19 1522) BP: (119-147)/(45-63) 119/51 mmHg (09/19 1537) SpO2:  [99 %] 99 % (09/19 1322) Weight:  [130 lb (58.968 kg)] 130 lb (58.968 kg) (09/19 1322)    General: Well-nourished, well-developed in no acute distress. Sister,  Niece, and daughter at bedside.  Head: Normocephalic, atraumatic.   Eyes: Conjunctiva pink, no icterus. Mouth: Oropharyngeal mucosa moist and pink , no lesions erythema or exudate. Neck: Supple without thyromegaly, masses, or lymphadenopathy.  Lungs: Clear to auscultation bilaterally.  Heart: Regular rate and rhythm, no murmurs rubs or gallops.  Abdomen: Bowel sounds are normal, nontender, nondistended, no hepatosplenomegaly or masses, no abdominal bruits or    hernia , no rebound or guarding.   Rectal: heme positive with red  blood in ed. Extremities: No lower extremity edema, clubbing, deformity.  Neuro: Alert and oriented x 4 , grossly normal neurologically.  Skin: Warm and dry, no rash or jaundice.   Psych: Alert and cooperative, normal mood and affect.        Intake/Output from previous day:   Intake/Output this shift: Total I/O In: 2362.5 [I.V.:2000; Blood:362.5] Out: -   Lab Results: CBC  Basename 09/23/11 1410  WBC 5.2  HGB 6.9*  HCT 23.8*  MCV 75.3*  PLT 381   H/H 12.1/36.3 03/2009  BMET  Basename 09/23/11 1410  NA 139  K 4.0  CL 109  CO2 22  GLUCOSE 108*  BUN 15  CREATININE 0.67  CALCIUM 9.0   LFT  Basename 09/23/11 1410  BILITOT 0.2*  BILIDIR --  IBILI --  ALKPHOS 59  AST 25  ALT 16  PROT 6.8  ALBUMIN 3.3*     PT/INR  Basename 09/23/11 1410  LABPROT 13.5  INR 1.04    Impression: 78 y/o female admitted with profound microcytic anemia, heme positive stool, intermittent brbpr in setting of chronic constipation. No prior colonoscopy or EGD. Takes daily ASA. No GI complaints otherwise. Normal H/H in 2011. Suspect occult GI bleeding to account for microcytic anemia (likely IDA). Ddx includes bleeding from AVMS, ulcer, malignancy.  Plan: 1. Colonoscopy +/- EGD tomorrow with Dr. Darrick Penna.  2. Transfuse as needed. 3. PPI.  I would like to thank Dr. Felecia Shelling for allowing [place] to take part in the care of this nice patient.    LOS: 0 days   Tana Coast  09/23/2011, 4:35 PM

## 2011-09-23 NOTE — H&P (Signed)
Sandra Williams MRN: [id number] DOB/AGE: May 27, 1925 78 y.o. Primary Care Physician:Azriel Dancy, MD Admit date: 09/23/2011 Chief Complaint:  Abnormal lab HPI: This is an 78 years old who seen in the office on 09/21/11 for routine follow up. Patient complained of generalized weakness and loss of energy. Routine labs were ordered including which showed very low H/H. Patient was sent to Er to day and she was further evaluated and was found to have positive stool occult blood. Blood transfusion was arranged and patient was admitted. GI consult also called. No headache, cough, chest pain, shortness of breath, nausea, vomiting, abdominal pain, hematemesis, malena, dysuria, urgency or frequency or urination.  Past Medical History  Diagnosis Date  . Osteoporosis   . Arthritis   . HTN (hypertension)   . Hyperlipidemia    Past Surgical History  Procedure Date  . Abdominal hysterectomy     partial  . Appendectomy   . Cataract extraction   . Cesarean section         Family History  Problem Relation Age of Onset  . Breast cancer Daughter   . Colon cancer Neg Hx   . Liver disease Neg Hx     Social History:  reports that she has never smoked. She does not have any smokeless tobacco history on file. She reports that she does not drink alcohol or use illicit drugs.   Allergies:  Allergies  Allergen Reactions  . Aspirin     REACTION: Upset stomach if takes regular and uncoated    Medications Prior to Admission  Medication Sig Dispense Refill  . aspirin EC 81 MG tablet Take 81 mg by mouth daily.      Marland Kitchen losartan (COZAAR) 100 MG tablet Take 100 mg by mouth daily.      . pravastatin (PRAVACHOL) 40 MG tablet Take 40 mg by mouth daily.           WUJ:WJXBJ from the symptoms mentioned above,there are no other symptoms referable to all systems reviewed.  Physical Exam: Blood pressure 145/73, pulse 69, temperature 97.9 F (36.6 C), temperature source Oral, resp. rate 20, height 5\' 2"   (1.575 m), weight 58 kg (127 lb 13.9 oz), SpO2 100.00%. HE ENT- pupils equal and reactive, neck supple Respiratory - good air entry, clear lung field CVS-S1 and S2 heard, regular Abdomen- soft and lax, bowel sound is positive EXT- no leg edema    Basename 09/23/11 1410  WBC 5.2  NEUTROABS 2.8  HGB 6.9*  HCT 23.8*  MCV 75.3*  PLT 381    Basename 09/23/11 1410  NA 139  K 4.0  CL 109  CO2 22  GLUCOSE 108*  BUN 15  CREATININE 0.67  CALCIUM 9.0  MG --  lablast2(ast:2,ALT:2,alkphos:2,bilitot:2,prot:2,albumin:2)@    No results found for this or any previous visit (from the past 240 hour(s)).   No results found. Impression: 1. GI bleed 2. Anaemia secondary to the above 3.Hypertension 4. Hyperlipedemia Active Problems:  * No active hospital problems. *      Plan: Type and crossmatch and transfuse 2 units of pack RBC Will monitor CBC GI consult appreciated.      Shantel Helwig   09/23/2011, 7:05 PM

## 2011-09-24 ENCOUNTER — Encounter (HOSPITAL_COMMUNITY)
Admission: EM | Disposition: A | Discharge status: Home / Self Care | Payer: Self-pay | Source: Home / Self Care | Attending: Internal Medicine

## 2011-09-24 ENCOUNTER — Inpatient Hospital Stay (HOSPITAL_COMMUNITY): Payer: Medicare Other

## 2011-09-24 ENCOUNTER — Encounter (HOSPITAL_COMMUNITY): Payer: Self-pay

## 2011-09-24 DIAGNOSIS — D649 Anemia, unspecified: Secondary | ICD-10-CM | POA: Diagnosis not present

## 2011-09-24 DIAGNOSIS — K922 Gastrointestinal hemorrhage, unspecified: Secondary | ICD-10-CM | POA: Diagnosis not present

## 2011-09-24 DIAGNOSIS — C189 Malignant neoplasm of colon, unspecified: Secondary | ICD-10-CM | POA: Diagnosis not present

## 2011-09-24 DIAGNOSIS — R1900 Intra-abdominal and pelvic swelling, mass and lump, unspecified site: Secondary | ICD-10-CM | POA: Diagnosis not present

## 2011-09-24 DIAGNOSIS — C180 Malignant neoplasm of cecum: Secondary | ICD-10-CM | POA: Diagnosis not present

## 2011-09-24 LAB — TYPE AND SCREEN
ABO/RH(D): O POS
Antibody Screen: NEGATIVE

## 2011-09-24 LAB — CBC
HCT: 30.6 % — ABNORMAL LOW (ref 36.0–46.0)
Platelets: 345 10*3/uL (ref 150–400)
RBC: 4 MIL/uL (ref 3.87–5.11)
RDW: 16.9 % — ABNORMAL HIGH (ref 11.5–15.5)
WBC: 6.1 10*3/uL (ref 4.0–10.5)

## 2011-09-24 SURGERY — COLONOSCOPY WITH ESOPHAGOGASTRODUODENOSCOPY (EGD)
Anesthesia: Moderate Sedation

## 2011-09-24 MED ORDER — SODIUM CHLORIDE 0.9 % IV SOLN
INTRAVENOUS | Status: DC
  Administered 2011-09-24: 14:00:00 via INTRAVENOUS

## 2011-09-24 MED ORDER — STERILE WATER FOR IRRIGATION IR SOLN
Status: DC | PRN
  Administered 2011-09-24: 11:00:00

## 2011-09-24 MED ORDER — MIDAZOLAM HCL 5 MG/5ML IJ SOLN
INTRAMUSCULAR | Status: AC
  Administered 2011-09-24: 11:00:00
  Filled 2011-09-24: qty 10

## 2011-09-24 MED ORDER — SODIUM CHLORIDE 0.45 % IV SOLN
INTRAVENOUS | Status: DC
  Administered 2011-09-24: 10:00:00 via INTRAVENOUS

## 2011-09-24 MED ORDER — MEPERIDINE HCL 100 MG/ML IJ SOLN
INTRAMUSCULAR | Status: DC | PRN
  Administered 2011-09-24 (×2): 25 mg via INTRAVENOUS

## 2011-09-24 MED ORDER — MIDAZOLAM HCL 5 MG/5ML IJ SOLN
INTRAMUSCULAR | Status: DC | PRN
  Administered 2011-09-24: 2 mg via INTRAVENOUS
  Administered 2011-09-24 (×2): 1 mg via INTRAVENOUS

## 2011-09-24 MED ORDER — IOHEXOL 300 MG/ML  SOLN
100.0000 mL | Freq: Once | INTRAMUSCULAR | Status: AC | PRN
  Administered 2011-09-24: 100 mL via INTRAVENOUS

## 2011-09-24 MED ORDER — MEPERIDINE HCL 100 MG/ML IJ SOLN
INTRAMUSCULAR | Status: AC
  Filled 2011-09-24: qty 2

## 2011-09-24 MED ORDER — SODIUM CHLORIDE 0.9 % IV SOLN: INTRAVENOUS | Status: DC

## 2011-09-24 NOTE — H&P (Signed)
  Primary Care Physician:  Avon Gully, MD Primary Gastroenterologist:  Dr. Darrick Penna  Pre-Procedure History & Physical: HPI:  Sandra Williams is a 78 y.o. female here for Anemia/heme pos stools.  Past Medical History  Diagnosis Date  . Osteoporosis   . Arthritis   . HTN (hypertension)   . Hyperlipidemia     Past Surgical History  Procedure Date  . Abdominal hysterectomy     partial  . Appendectomy   . Cataract extraction   . Cesarean section     Prior to Admission medications   Medication Sig Start Date End Date Taking? Authorizing Provider  aspirin EC 81 MG tablet Take 81 mg by mouth daily.   Yes Historical Provider, MD  losartan (COZAAR) 100 MG tablet Take 100 mg by mouth daily.   Yes Historical Provider, MD  pravastatin (PRAVACHOL) 40 MG tablet Take 40 mg by mouth daily.   Yes Historical Provider, MD    Allergies as of 09/23/2011 - Review Complete 09/23/2011  Allergen Reaction Noted  . Aspirin  10/07/2006    Family History  Problem Relation Age of Onset  . Breast cancer Daughter   . Colon cancer Neg Hx   . Liver disease Neg Hx     History   Social History  . Marital Status: Single    Spouse Name: N/A    Number of Children: N/A  . Years of Education: N/A   Occupational History  . Not on file.   Social History Main Topics  . Smoking status: Never Smoker   . Smokeless tobacco: Not on file  . Alcohol Use: No  . Drug Use: No  . Sexually Active: No   Other Topics Concern  . Not on file   Social History Narrative  . No narrative on file    Review of Systems: See HPI, otherwise negative ROS   Physical Exam: BP 145/56  Pulse 67  Temp 98.5 F (36.9 C) (Oral)  Resp 13  Ht 5\' 2"  (1.575 m)  Wt 127 lb 13.9 oz (58 kg)  BMI 23.39 kg/m2  SpO2 98% General:   Alert,  pleasant and cooperative in NAD Head:  Normocephalic and atraumatic. Neck:  Supple; Lungs:  Clear throughout to auscultation.    Heart:  Regular rate and rhythm. Abdomen:  Soft,  nontender and nondistended. Normal bowel sounds, without guarding, and without rebound.   Neurologic:  Alert and  oriented x4;  grossly normal neurologically.  Impression/Plan:     HEME POS STOOLS/ANEMIA  PLAN:  1.TCS?EGD

## 2011-09-24 NOTE — Progress Notes (Signed)
UR Chart Review Completed  

## 2011-09-24 NOTE — Progress Notes (Signed)
Subjective: Patient feels better. She was transfused 2 units of PRBC. She is scheduled for colonoscopy today. No nausea, vomiting , abdominal pain or malena  Objective: Vital signs in last 24 hours: Temp:  [97.7 F (36.5 C)-98.7 F (37.1 C)] 98.2 F (36.8 C) (09/20 1610) Pulse Rate:  [63-92] 63  (09/20 0611) Resp:  [18-24] 18  (09/20 0611) BP: (119-152)/(45-83) 130/66 mmHg (09/20 0611) SpO2:  [96 %-100 %] 96 % (09/20 0611) Weight:  [58 kg (127 lb 13.9 oz)-58.968 kg (130 lb)] 58 kg (127 lb 13.9 oz) (09/19 1652) Weight change:  Last BM Date: 09/24/11  Intake/Output from previous day: 09/19 0701 - 09/20 0700 In: 3315 [P.O.:240; I.V.:2000; Blood:1075] Out: -   PHYSICAL EXAM General appearance: alert and no distress Resp: clear to auscultation bilaterally Cardio: S1, S2 normal GI: soft, non-tender; bowel sounds normal; no masses,  no organomegaly Extremities: extremities normal, atraumatic, no cyanosis or edema and edema   Lab Results:    @labtest @ [facility] No results found for this basename: PHART,PCO2,PO2ART,TCO2,HCO3 in the last 72 hours CULTURES No results found for this or any previous visit (from the past 240 hour(s)). Studies/Results: No results found.  Medications: I have reviewed the patient's current medications.  Assesment: GI bleed  2. Anaemia secondary to the above  3.Hypertension  4. Hyperlipedemia  Active Problems:  * No active hospital problems. *     Plan: Colonoscopy as planned Continue PPI We will monitor CBC.    LOS: 1 day   Malikah Lakey 09/24/2011, 8:23 AM

## 2011-09-24 NOTE — Op Note (Addendum)
[facility] [address] [place], [address]   COLONOSCOPY PROCEDURE REPORT  PATIENT: Sandra Williams, Sandra Williams  MR#: [id number] BIRTHDATE: 10-29-1925 , 86  yrs. old GENDER: Female ENDOSCOPIST: Jonette Eva, MD REFERRED XB:[id number] Fanta, M.D. PROCEDURE DATE:  09/24/2011 PROCEDURE:   Colonoscopy with biopsy and Colonoscopy with snare polypectomy INDICATIONS:iron deficiency anemia and heme-positive stool. MEDICATIONS: Demerol 50 mg IV and Versed 5 mg IV  DESCRIPTION OF PROCEDURE:    Physical exam was performed.  Informed consent was obtained from the patient after explaining the benefits, risks, and alternatives to procedure.  The patient was connected to monitor and placed in left lateral position. Continuous oxygen was provided by nasal cannula and IV medicine administered through an indwelling cannula.  After administration of sedation and rectal exam, the patients rectum was intubated and the EC-[id number] ([id number])  colonoscope was advanced under direct visualization to the cecum.  The scope was removed slowly by carefully examining the color, texture, anatomy, and integrity mucosa on the way out.  The patient was recovered in endoscopy and discharged home in satisfactory condition.       COLON FINDINGS: A near circumferential mass was found at the cecum. Multiple biopsies were performed using cold forceps.  , A sessile polyp measuring 6 mm in size was found in the transverse colon.  A polypectomy was performed using snare cautery.  , Moderate diverticulosis was noted throughout the entire examined colon.  , and Large internal hemorrhoids were found.  PREP QUALITY: good. CECAL W/D TIME: 15.5 minutes  COMPLICATIONS: None  ENDOSCOPIC IMPRESSION: 1.   Near circumferential mass were found at the cecum; multiple biopsies were performed using cold forceps 2.   Sessile polyp measuring 6 mm in size was found in the transverse colon; polypectomy was performed using  snare cautery 3.   Moderate diverticulosis was noted throughout the entire examined colon 4.   Large internal hemorrhoids   RECOMMENDATIONS: 1.  await biopsy results 2.  SOFT MECHANICAL DIET OK TO D/C TO HOME 9/20 AWAIT BIOPSIES CEA/CT ABD/PELVIS/CXR PA/LAT  discussed with daughter [phone](WUXLKGM Marena Chancy)  3.  SOFT MECHANICAL DIET OK TO D/C TO HOME 9/20 AWAIT BIOPSIES CEA      _______________________________ eSignedJonette Eva, MD 09/24/2011 1:50 PM Revised: 09/24/2011 1:50 PM    PATIENT NAME:  Sandra Williams, Sandra Williams MR#: [id number]

## 2011-09-25 DIAGNOSIS — K922 Gastrointestinal hemorrhage, unspecified: Secondary | ICD-10-CM | POA: Diagnosis not present

## 2011-09-25 DIAGNOSIS — R1900 Intra-abdominal and pelvic swelling, mass and lump, unspecified site: Secondary | ICD-10-CM | POA: Diagnosis not present

## 2011-09-25 DIAGNOSIS — D649 Anemia, unspecified: Secondary | ICD-10-CM | POA: Diagnosis not present

## 2011-09-25 LAB — CEA: CEA: 0.9 ng/mL (ref 0.0–5.0)

## 2011-09-25 MED ORDER — PANTOPRAZOLE SODIUM 40 MG PO TBEC: 40.0000 mg | DELAYED_RELEASE_TABLET | Freq: Every day | ORAL | Status: DC

## 2011-09-25 NOTE — Discharge Summary (Signed)
Physician Discharge Summary  Patient ID: Sandra Williams MRN: [id number] DOB/AGE: 07-01-1925 78 y.o. Primary Care Physician:Willa Brocks, MD Admit date: 09/23/2011 Discharge date: 09/25/2011    Discharge Diagnoses:  Colon mass GI bleed   Anaemia secondary to the above  .Hypertension  Hyperlipedemia  Active Problems:  * No active hospital problems. *      Medication List     As of 09/25/2011  5:56 PM    TAKE these medications         aspirin EC 81 MG tablet   Take 81 mg by mouth daily.      losartan 100 MG tablet   Commonly known as: COZAAR   Take 100 mg by mouth daily.      pantoprazole 40 MG tablet   Commonly known as: PROTONIX   Take 1 tablet (40 mg total) by mouth daily at 12 noon.      pravastatin 40 MG tablet   Commonly known as: PRAVACHOL   Take 40 mg by mouth daily.        Discharged Condition: stable   Consults: GI Significant Diagnostic Studies: Dg Chest 2 View  09/24/2011  *RADIOLOGY REPORT*  Clinical Data: Cecal carcinoma.  CHEST - 2 VIEW  Comparison: 03/20/2009.  Findings: Mildly enlarged cardiac silhouette with a mild interval increase in size.  Interval linear density in the lingula.  The hemidiaphragms remain mildly flattened on the lateral view with the exception of small anterior eventrations.  Mild diffuse peribronchial thickening and accentuation of the interstitial markings is unchanged.  Thoracic spine degenerative changes.  IMPRESSION:  1.  Interval mild cardiomegaly. 2.  Stable mild changes of COPD and chronic bronchitis.   Original Report Authenticated By: Darrol Angel, M.D.    Ct Abdomen Pelvis W Contrast  09/24/2011  *RADIOLOGY REPORT*  Clinical Data: Newly diagnosed colon carcinoma.  CT ABDOMEN AND PELVIS WITH CONTRAST  Technique:  Multidetector CT imaging of the abdomen and pelvis was performed following the standard protocol during bolus administration of intravenous contrast.  Contrast: OMNIPAQUE IOHEXOL 300 MG/ML  SOLN   Comparison: 03/25/2009  Findings: Mild hepatic steatosis again noted.  No liver masses are identified.  Gallbladder is unremarkable.  No evidence of biliary or pancreatic ductal dilatation.  The pancreas is normal appearance.  Small bilateral renal cysts are again noted.  No evidence of renal mass or hydronephrosis.  The adrenal glands and spleen are normal in appearance.  Colonic diverticulosis is noted, however there is no evidence of diverticulitis. Focal wall thickening is seen involving the posterior medial wall of the cecum, consistent with known cecal carcinoma.  No evidence of bowel obstruction.  No evidence of lymphadenopathy.  No other soft tissue masses are identified.  No evidence of inflammatory process or abnormal fluid collections within the abdomen or pelvis.  Prior hysterectomy noted.  No suspicious bone lesions are identified.  IMPRESSION:  1.  Focal wall thickening in the cecum, consistent with recently diagnosed cecal carcinoma. 2.  No evidence of metastatic disease. 3.   Diverticulosis.  No radiographic evidence of diverticulitis. 4.  Mild hepatic steatosis.   Original Report Authenticated By: Danae Orleans, M.D.     Lab Results: Basic Metabolic Panel:  Basename 09/23/11 1410  NA 139  K 4.0  CL 109  CO2 22  GLUCOSE 108*  BUN 15  CREATININE 0.67  CALCIUM 9.0  MG --  PHOS --   Liver Function Tests:  Basename 09/23/11 1410  AST 25  ALT 16  ALKPHOS 59  BILITOT 0.2*  PROT 6.8  ALBUMIN 3.3*     CBC:  Basename 09/24/11 0440 09/23/11 1410  WBC 6.1 5.2  NEUTROABS -- 2.8  HGB 9.6* 6.9*  HCT 30.6* 23.8*  MCV 76.5* 75.3*  PLT 345 381    No results found for this or any previous visit (from the past 240 hour(s)).   Hospital Course:  This is an 78 years old female patient with history of hypertension and hyperlipidemia was admitted due to anemia secondary to GI bleeding. Patient was transfused and GI consult was. Patient colonoscopy which showed large cecal mass.  Biopsy was done patient is being discharged home in stable condition to be followed in out patient and will be referred to surgery for possible surgery. Discharge Exam: Blood pressure 117/57, pulse 68, temperature 97.9 F (36.6 C), temperature source Tympanic, resp. rate 18, height 5\' 2"  (1.575 m), weight 58 kg (127 lb 13.9 oz), SpO2 96.00%. * Disposition: *home     Signed: Jaydence Arnesen   09/25/2011, 5:56 PM

## 2011-09-25 NOTE — Progress Notes (Signed)
Patient received discharge instructions along with follow up appointments and prescriptions. Patient verbalized understanding of  All instructions. Patient was escorted by staff via wheelchair to vehicle. Patient discharged to home in stable condition.

## 2011-09-26 DIAGNOSIS — R1900 Intra-abdominal and pelvic swelling, mass and lump, unspecified site: Secondary | ICD-10-CM | POA: Diagnosis not present

## 2011-09-26 DIAGNOSIS — D649 Anemia, unspecified: Secondary | ICD-10-CM | POA: Diagnosis not present

## 2011-09-26 DIAGNOSIS — K922 Gastrointestinal hemorrhage, unspecified: Secondary | ICD-10-CM | POA: Diagnosis not present

## 2011-09-28 ENCOUNTER — Telehealth: Payer: Self-pay | Admitting: Gastroenterology

## 2011-09-28 NOTE — Telephone Encounter (Signed)
Path faxed to PCP, recalls made 

## 2011-09-28 NOTE — Telephone Encounter (Signed)
Patient is scheduled to see Dr. Malvin Johns on Tuesday Oct 1st at 2:00 and the patient and her daughter is aware

## 2011-09-28 NOTE — Telephone Encounter (Signed)
SPOKE WITH PT'S DAUGHTER. STOP ASPIRIN. DAUGHTER REQUESTED DR. BRADFORD. WOULD LIKE SURGERY AROUND OCT 21. ALL FIRST DEGREE RELATIVES NEED TCS AT AGE 78. OPV IN ONE YEAR WITH [facility]. REPEAT TCS IN OCT 2013.

## 2011-09-29 NOTE — Telephone Encounter (Signed)
REVIEWED.  

## 2011-10-04 ENCOUNTER — Telehealth: Payer: Self-pay

## 2011-10-04 DIAGNOSIS — D649 Anemia, unspecified: Secondary | ICD-10-CM | POA: Diagnosis not present

## 2011-10-04 NOTE — Telephone Encounter (Signed)
PLEASE CALL PT'S DAUGHTER. SHE SHUDL CALL TO CANCEL APPT WITH DR. Malvin Johns. WE WILL MAKE A NEW REFERRAL TO DR. Lovell Sheehan only-per pt request.

## 2011-10-04 NOTE — Telephone Encounter (Signed)
T/C from daughter, Maggie Font. [phone]). She said Dr. Darrick Penna scheduled her mom an appt with Dr. Malvin Johns for tomorrow and she would like Dr. Darrick Penna to cancel that appt and send a referral to Dr. Lovell Sheehan instead. She can be reached at the above phone number . Please advise!

## 2011-10-04 NOTE — Telephone Encounter (Signed)
Referral has been made to Dr. Lovell Sheehan on October 3rd at 9:00 am and patients family is aware

## 2011-10-04 NOTE — Telephone Encounter (Signed)
Per Soledad Gerlach, She called and told them to cancel the appt with Dr. Malvin Johns.

## 2011-10-07 DIAGNOSIS — C182 Malignant neoplasm of ascending colon: Secondary | ICD-10-CM | POA: Diagnosis not present

## 2011-10-07 NOTE — H&P (Signed)
  NTS SOAP Note  Vital Signs:  Vitals as of: 10/07/2011: Systolic 152: Diastolic 72: Heart Rate 83: Temp 98.71F: Height 53ft 2in: Weight 141Lbs 0 Ounces: BMI 26  BMI : 25.79 kg/m2  Subjective: This 16 Years 63 Months old Female presents for colon cancer.  Found on routine TCS by Dr. Jena Gauss.  Denies any gi complaints except for heartburn.   Review of Symptoms:  Constitutional:  fatigue,chills Head:unremarkable    Eyes:unremarkable   Nose/Mouth/Throat:unremarkable Cardiovascular:  unremarkable   Respiratory:unremarkable   Gastrointestinal:  unremarkable   Genitourinary:unremarkable       arthritis Skin:unremarkable Hematolgic/Lymphatic:unremarkable     Allergic/Immunologic:unremarkable     Past Medical History:    Reviewed   Past Medical History  Surgical History: appendectomy, TAH Medical Problems:  High Blood pressure, High cholesterol Allergies: asa Medications: pravastatin, losartin, prevacid   Social History:Reviewed  Social History  Preferred Language: English ([place]) Ethnicity: Not Hispanic / Latino Age: 34 Years 10 Months Alcohol:  No Recreational drug(s):  No   Smoking Status: Never smoker reviewed on 10/07/2011  Family History:  Reviewed   Family History  Is there a family history AV:[id number]    Objective Information: General:  Well appearing, well nourished in no distress. Head:Atraumatic; no masses; no abnormalities Neck:  Supple without lymphadenopathy.  Heart:  RRR, no murmur or gallop.  Normal S1, S2.  No S3, S4.  Lungs:    CTA bilaterally, no wheezes, rhonchi, rales.  Breathing unlabored. Abdomen:Soft, NT/ND, normal bowel sounds, no HSM, no masses.  No peritoneal signs.  Assessment:Cecal colon carcinoma  Diagnosis &amp; Procedure: DiagnosisCode: 153.6, ProcedureCode: 95621,    Plan:Scheduled for partial colectomy on 10/22/11.   Patient  Education:Alternative treatments to surgery were discussed with patient (and family).  Risks and benefits  of procedure were fully explained to the patient (and family) who gave informed consent. Patient/family questions were addressed.  Follow-up:Pending Surgery

## 2011-10-11 ENCOUNTER — Encounter (HOSPITAL_COMMUNITY): Payer: Self-pay

## 2011-10-18 ENCOUNTER — Encounter (HOSPITAL_COMMUNITY)
Admission: RE | Admit: 2011-10-18 | Discharge: 2011-10-18 | Disposition: A | Discharge status: Home / Self Care | Payer: Medicare Other | Source: Ambulatory Visit | Attending: General Surgery | Admitting: General Surgery

## 2011-10-18 ENCOUNTER — Encounter (HOSPITAL_COMMUNITY): Payer: Self-pay

## 2011-10-18 ENCOUNTER — Ambulatory Visit (HOSPITAL_COMMUNITY)
Admission: RE | Admit: 2011-10-18 | Discharge: 2011-10-18 | Disposition: A | Discharge status: Home / Self Care | Payer: Medicare Other | Source: Ambulatory Visit | Attending: General Surgery | Admitting: General Surgery

## 2011-10-18 DIAGNOSIS — I10 Essential (primary) hypertension: Secondary | ICD-10-CM | POA: Diagnosis not present

## 2011-10-18 DIAGNOSIS — C180 Malignant neoplasm of cecum: Secondary | ICD-10-CM | POA: Diagnosis not present

## 2011-10-18 DIAGNOSIS — K219 Gastro-esophageal reflux disease without esophagitis: Secondary | ICD-10-CM | POA: Diagnosis not present

## 2011-10-18 DIAGNOSIS — C189 Malignant neoplasm of colon, unspecified: Secondary | ICD-10-CM | POA: Diagnosis not present

## 2011-10-18 DIAGNOSIS — Z01818 Encounter for other preprocedural examination: Secondary | ICD-10-CM | POA: Diagnosis not present

## 2011-10-18 DIAGNOSIS — D62 Acute posthemorrhagic anemia: Secondary | ICD-10-CM | POA: Diagnosis not present

## 2011-10-18 HISTORY — DX: Malignant (primary) neoplasm, unspecified: C80.1

## 2011-10-18 HISTORY — DX: Gastro-esophageal reflux disease without esophagitis: K21.9

## 2011-10-18 LAB — CBC WITH DIFFERENTIAL/PLATELET
Basophils Absolute: 0.1 10*3/uL (ref 0.0–0.1)
Basophils Relative: 1 % (ref 0–1)
Eosinophils Absolute: 0.3 10*3/uL (ref 0.0–0.7)
HCT: 34.3 % — ABNORMAL LOW (ref 36.0–46.0)
Hemoglobin: 10.3 g/dL — ABNORMAL LOW (ref 12.0–15.0)
MCH: 22.8 pg — ABNORMAL LOW (ref 26.0–34.0)
MCHC: 30 g/dL (ref 30.0–36.0)
Monocytes Absolute: 0.5 10*3/uL (ref 0.1–1.0)
Monocytes Relative: 8 % (ref 3–12)
RDW: 18.2 % — ABNORMAL HIGH (ref 11.5–15.5)

## 2011-10-18 LAB — COMPREHENSIVE METABOLIC PANEL
BUN: 17 mg/dL (ref 6–23)
Calcium: 9.6 mg/dL (ref 8.4–10.5)
Creatinine, Ser: 0.73 mg/dL (ref 0.50–1.10)
GFR calc Af Amer: 88 mL/min — ABNORMAL LOW (ref >90)
Glucose, Bld: 104 mg/dL — ABNORMAL HIGH (ref 70–99)
Total Protein: 7.5 g/dL (ref 6.0–8.3)

## 2011-10-18 LAB — SURGICAL PCR SCREEN: MRSA, PCR: NEGATIVE

## 2011-10-18 NOTE — Patient Instructions (Signed)
22 Sandra Williams  10/18/2011   Your procedure is scheduled on:  Friday, 10/22/11  [facility] to [facility] [place] [facility].  Call this number if you have problems the morning of surgery: [phone]   Remember:   Do not eat food:After Midnight.  May have clear liquids:until Midnight .  Clear liquids include soda, tea, black coffee, apple or grape juice, broth.  Take these medicines the morning of surgery with A SIP OF WATER: protonix and losartan   Do not wear jewelry, make-up or nail polish.  Do not wear lotions, powders, or perfumes. You may wear deodorant.  Do not shave 48 hours prior to surgery. Men may shave face and neck.  Do not bring valuables to the hospital.  Contacts, dentures or bridgework may not be worn into surgery.  Leave suitcase in the car. After surgery it may be brought to your room.  For patients admitted to the hospital, checkout time is 11:00 AM the day of discharge.   Patients discharged the day of surgery will not be allowed to drive home.  Name and phone number of your driver: [email or web address]  Special Instructions: Shower using CHG 2 nights before surgery and the night before surgery.  If you shower the day of surgery use CHG.  Use special wash - you have one bottle of CHG for all showers.  You should use approximately 1/3 of the bottle for each shower.   Please read over the following fact sheets that you were given: Pain Booklet, Coughing and Deep Breathing, Blood Transfusion Information, Lab Information, MRSA Information, Surgical Site Infection Prevention, Anesthesia Post-op Instructions and Care and Recovery After Surgery   Removal of Colon (Colectomy) Care Before and After Surgery A partial or total colectomy is the removal of part or all of your colon. This is most often done under a general anesthetic so that you are sleeping during the procedure. Following the procedure, you will be taken to a recovery room. Once you have recovered from the anesthetic you will be  returned to your room.  LET YOUR CAREGIVERS KNOW ABOUT:  Allergies  Medications taken including herbs, eye drops, over the counter medications, and creams.  Use of steroids (by mouth or creams).  Previous problems with anesthetics or Novocaine.  Possibility of pregnancy, if this applies.  History of blood clots (thrombophlebitis).  History of bleeding or blood problems.  Previous surgery.  Other health problems. AFTER THE PROCEDURE After surgery, you will be taken to the recovery area where a nurse will watch you and check your progress. After the recovery area you will go to your hospital room. Your surgeon will determine when it is alright for you to take fluids and foods. You will be given pain medicine to keep you comfortable. HOME CARE INSTRUCTIONS  Once home, an ice pack applied to the operative site may help with discomfort and keep swelling down.  Change dressings as directed.  Only take over-the-counter or prescription medicines for pain, discomfort, or fever as directed by your caregiver.  You may continue normal diet and activities as directed.  There should be no heavy lifting (more than 10 pounds), strenuous activities or contact sports for three weeks, or as directed.  Keep the wound dry and clean. The wound may be washed gently with soap and water. Gently blot or dab dry following cleansing without rubbing. Do not take baths, use swimming pools or use hot tubs for ten days, or as instructed by your caregivers.  If you have  a colostomy, care for it as you have been shown. SEEK MEDICAL CARE IF:   There is redness, swelling, or increasing pain in the wound area.  Pus is coming from the wound.  An unexplained oral temperature above 101 F (38.3 C) develops.  You notice a foul smell coming from the wound or dressing.  There is a breaking open of a wound (edges not staying together) after the sutures have been removed.  There is increasing abdominal  pain. SEEK IMMEDIATE MEDICAL CARE IF:   A rash develops.  There is difficulty breathing, or development of a reaction or side effects to medications given. Document Released: 09/25/2003 Document Revised: 03/15/2011 Document Reviewed: 01/24/2007 [facility] Patient Information 2013 [place], [place].

## 2011-10-22 ENCOUNTER — Ambulatory Visit (HOSPITAL_COMMUNITY): Payer: Medicare Other | Admitting: Anesthesiology

## 2011-10-22 ENCOUNTER — Encounter (HOSPITAL_COMMUNITY): Payer: Self-pay | Admitting: Anesthesiology

## 2011-10-22 ENCOUNTER — Encounter (HOSPITAL_COMMUNITY)
Admission: AD | Disposition: A | Discharge status: Home / Self Care | Payer: Self-pay | Source: Ambulatory Visit | Attending: General Surgery

## 2011-10-22 ENCOUNTER — Encounter (HOSPITAL_COMMUNITY): Payer: Self-pay | Admitting: *Deleted

## 2011-10-22 ENCOUNTER — Inpatient Hospital Stay (HOSPITAL_COMMUNITY)
Admission: AD | Admit: 2011-10-22 | Discharge: 2011-10-26 | DRG: 330 | Disposition: A | Discharge status: Home / Self Care | Payer: Medicare Other | Source: Ambulatory Visit | Attending: General Surgery | Admitting: General Surgery

## 2011-10-22 DIAGNOSIS — C189 Malignant neoplasm of colon, unspecified: Secondary | ICD-10-CM | POA: Diagnosis not present

## 2011-10-22 DIAGNOSIS — D010 Carcinoma in situ of colon: Secondary | ICD-10-CM | POA: Diagnosis not present

## 2011-10-22 DIAGNOSIS — M1990 Unspecified osteoarthritis, unspecified site: Secondary | ICD-10-CM | POA: Diagnosis present

## 2011-10-22 DIAGNOSIS — Z9089 Acquired absence of other organs: Secondary | ICD-10-CM | POA: Diagnosis not present

## 2011-10-22 DIAGNOSIS — E7800 Pure hypercholesterolemia, unspecified: Secondary | ICD-10-CM | POA: Diagnosis present

## 2011-10-22 DIAGNOSIS — I10 Essential (primary) hypertension: Secondary | ICD-10-CM | POA: Diagnosis present

## 2011-10-22 DIAGNOSIS — R197 Diarrhea, unspecified: Secondary | ICD-10-CM | POA: Diagnosis not present

## 2011-10-22 DIAGNOSIS — D62 Acute posthemorrhagic anemia: Secondary | ICD-10-CM | POA: Diagnosis not present

## 2011-10-22 DIAGNOSIS — Z886 Allergy status to analgesic agent status: Secondary | ICD-10-CM | POA: Diagnosis not present

## 2011-10-22 DIAGNOSIS — Z79899 Other long term (current) drug therapy: Secondary | ICD-10-CM

## 2011-10-22 DIAGNOSIS — Z90710 Acquired absence of both cervix and uterus: Secondary | ICD-10-CM | POA: Diagnosis not present

## 2011-10-22 DIAGNOSIS — C19 Malignant neoplasm of rectosigmoid junction: Secondary | ICD-10-CM | POA: Diagnosis not present

## 2011-10-22 DIAGNOSIS — C180 Malignant neoplasm of cecum: Principal | ICD-10-CM | POA: Diagnosis present

## 2011-10-22 DIAGNOSIS — C182 Malignant neoplasm of ascending colon: Secondary | ICD-10-CM | POA: Diagnosis not present

## 2011-10-22 DIAGNOSIS — K219 Gastro-esophageal reflux disease without esophagitis: Secondary | ICD-10-CM | POA: Diagnosis not present

## 2011-10-22 HISTORY — PX: PARTIAL COLECTOMY: SHX5273

## 2011-10-22 SURGERY — COLECTOMY, PARTIAL
Anesthesia: General | Site: Abdomen | Wound class: Clean Contaminated

## 2011-10-22 MED ORDER — LACTATED RINGERS IV SOLN: INTRAVENOUS | Status: DC

## 2011-10-22 MED ORDER — POVIDONE-IODINE 10 % EX OINT
TOPICAL_OINTMENT | CUTANEOUS | Status: DC | PRN
  Administered 2011-10-22: 2 via TOPICAL

## 2011-10-22 MED ORDER — ALVIMOPAN 12 MG PO CAPS
12.0000 mg | ORAL_CAPSULE | Freq: Once | ORAL | Status: AC
  Administered 2011-10-22: 12 mg via ORAL

## 2011-10-22 MED ORDER — LACTATED RINGERS IV SOLN
INTRAVENOUS | Status: DC
  Administered 2011-10-22 – 2011-10-25 (×4): via INTRAVENOUS

## 2011-10-22 MED ORDER — PANTOPRAZOLE SODIUM 40 MG PO TBEC
40.0000 mg | DELAYED_RELEASE_TABLET | Freq: Every day | ORAL | Status: DC
  Administered 2011-10-22 – 2011-10-25 (×4): 40 mg via ORAL
  Filled 2011-10-22 (×5): qty 1

## 2011-10-22 MED ORDER — LIDOCAINE HCL (PF) 1 % IJ SOLN
INTRAMUSCULAR | Status: AC
  Filled 2011-10-22: qty 2

## 2011-10-22 MED ORDER — MIDAZOLAM HCL 2 MG/2ML IJ SOLN
1.0000 mg | INTRAMUSCULAR | Status: DC | PRN
  Administered 2011-10-22: 2 mg via INTRAVENOUS

## 2011-10-22 MED ORDER — SUCCINYLCHOLINE CHLORIDE 20 MG/ML IJ SOLN
INTRAMUSCULAR | Status: AC
  Filled 2011-10-22: qty 1

## 2011-10-22 MED ORDER — LACTATED RINGERS IV SOLN
INTRAVENOUS | Status: DC | PRN
  Administered 2011-10-22 (×2): via INTRAVENOUS

## 2011-10-22 MED ORDER — ONDANSETRON HCL 4 MG/2ML IJ SOLN: 4.0000 mg | Freq: Once | INTRAMUSCULAR | Status: DC | PRN

## 2011-10-22 MED ORDER — ACETAMINOPHEN 10 MG/ML IV SOLN
INTRAVENOUS | Status: AC
  Filled 2011-10-22: qty 100

## 2011-10-22 MED ORDER — FENTANYL CITRATE 0.05 MG/ML IJ SOLN: INTRAMUSCULAR | Status: DC | PRN

## 2011-10-22 MED ORDER — HEMOSTATIC AGENTS (NO CHARGE) OPTIME
TOPICAL | Status: DC | PRN
  Administered 2011-10-22 (×2): 1 via TOPICAL

## 2011-10-22 MED ORDER — NEOSTIGMINE METHYLSULFATE 1 MG/ML IJ SOLN
INTRAMUSCULAR | Status: AC
  Filled 2011-10-22: qty 10

## 2011-10-22 MED ORDER — ONDANSETRON HCL 4 MG/2ML IJ SOLN
INTRAMUSCULAR | Status: AC
  Filled 2011-10-22: qty 2

## 2011-10-22 MED ORDER — POVIDONE-IODINE 10 % EX OINT
TOPICAL_OINTMENT | CUTANEOUS | Status: AC
  Filled 2011-10-22: qty 2

## 2011-10-22 MED ORDER — MORPHINE SULFATE 2 MG/ML IJ SOLN
2.0000 mg | INTRAMUSCULAR | Status: DC | PRN
  Administered 2011-10-22 – 2011-10-25 (×8): 2 mg via INTRAVENOUS
  Filled 2011-10-22 (×8): qty 1

## 2011-10-22 MED ORDER — ONDANSETRON HCL 4 MG PO TABS: 4.0000 mg | ORAL_TABLET | Freq: Four times a day (QID) | ORAL | Status: DC | PRN

## 2011-10-22 MED ORDER — ENOXAPARIN SODIUM 40 MG/0.4ML ~~LOC~~ SOLN
SUBCUTANEOUS | Status: AC
  Filled 2011-10-22: qty 0.4

## 2011-10-22 MED ORDER — FENTANYL CITRATE 0.05 MG/ML IJ SOLN
25.0000 ug | INTRAMUSCULAR | Status: DC | PRN
  Administered 2011-10-22 (×2): 50 ug via INTRAVENOUS

## 2011-10-22 MED ORDER — ONDANSETRON HCL 4 MG/2ML IJ SOLN: 4.0000 mg | Freq: Four times a day (QID) | INTRAMUSCULAR | Status: DC | PRN

## 2011-10-22 MED ORDER — SODIUM CHLORIDE 0.9 % IR SOLN
Status: DC | PRN
  Administered 2011-10-22: 2000 mL

## 2011-10-22 MED ORDER — ERTAPENEM SODIUM 1 G IJ SOLR
INTRAMUSCULAR | Status: AC
  Filled 2011-10-22: qty 1

## 2011-10-22 MED ORDER — ALVIMOPAN 12 MG PO CAPS
ORAL_CAPSULE | ORAL | Status: AC
  Filled 2011-10-22: qty 1

## 2011-10-22 MED ORDER — ENOXAPARIN SODIUM 40 MG/0.4ML ~~LOC~~ SOLN
40.0000 mg | Freq: Once | SUBCUTANEOUS | Status: AC
  Administered 2011-10-22: 40 mg via SUBCUTANEOUS

## 2011-10-22 MED ORDER — ALVIMOPAN 12 MG PO CAPS
12.0000 mg | ORAL_CAPSULE | Freq: Two times a day (BID) | ORAL | Status: DC
  Administered 2011-10-23 – 2011-10-24 (×3): 12 mg via ORAL
  Filled 2011-10-22 (×4): qty 1

## 2011-10-22 MED ORDER — LOSARTAN POTASSIUM 50 MG PO TABS
100.0000 mg | ORAL_TABLET | Freq: Every day | ORAL | Status: DC
  Administered 2011-10-22 – 2011-10-26 (×5): 100 mg via ORAL
  Filled 2011-10-22: qty 2
  Filled 2011-10-22 (×2): qty 1
  Filled 2011-10-22 (×2): qty 2
  Filled 2011-10-22: qty 1
  Filled 2011-10-22: qty 2

## 2011-10-22 MED ORDER — GLYCOPYRROLATE 0.2 MG/ML IJ SOLN
INTRAMUSCULAR | Status: DC | PRN
  Administered 2011-10-22: .4 mg via INTRAVENOUS
  Administered 2011-10-22: 0.2 mg via INTRAVENOUS

## 2011-10-22 MED ORDER — SUCCINYLCHOLINE CHLORIDE 20 MG/ML IJ SOLN
INTRAMUSCULAR | Status: DC | PRN
  Administered 2011-10-22: 100 mg via INTRAVENOUS

## 2011-10-22 MED ORDER — ONDANSETRON HCL 4 MG/2ML IJ SOLN
4.0000 mg | Freq: Once | INTRAMUSCULAR | Status: AC
  Administered 2011-10-22: 4 mg via INTRAVENOUS

## 2011-10-22 MED ORDER — ACETAMINOPHEN 10 MG/ML IV SOLN
1000.0000 mg | Freq: Four times a day (QID) | INTRAVENOUS | Status: AC
  Administered 2011-10-22 – 2011-10-23 (×3): 1000 mg via INTRAVENOUS
  Filled 2011-10-22 (×3): qty 100

## 2011-10-22 MED ORDER — ROCURONIUM BROMIDE 100 MG/10ML IV SOLN
INTRAVENOUS | Status: DC | PRN
  Administered 2011-10-22: 20 mg via INTRAVENOUS

## 2011-10-22 MED ORDER — ETOMIDATE 2 MG/ML IV SOLN
INTRAVENOUS | Status: AC
  Filled 2011-10-22: qty 10

## 2011-10-22 MED ORDER — SODIUM CHLORIDE 0.9 % IJ SOLN
INTRAMUSCULAR | Status: AC
  Administered 2011-10-23: 16:00:00
  Filled 2011-10-22: qty 3

## 2011-10-22 MED ORDER — BUPIVACAINE HCL (PF) 0.5 % IJ SOLN
INTRAMUSCULAR | Status: AC
  Filled 2011-10-22: qty 30

## 2011-10-22 MED ORDER — MIDAZOLAM HCL 2 MG/2ML IJ SOLN
INTRAMUSCULAR | Status: AC
  Filled 2011-10-22: qty 2

## 2011-10-22 MED ORDER — CHLORHEXIDINE GLUCONATE 4 % EX LIQD: 1.0000 "application " | Freq: Once | CUTANEOUS | Status: DC

## 2011-10-22 MED ORDER — FENTANYL CITRATE 0.05 MG/ML IJ SOLN
INTRAMUSCULAR | Status: AC
  Filled 2011-10-22: qty 5

## 2011-10-22 MED ORDER — SODIUM CHLORIDE 0.9 % IV SOLN
1.0000 g | INTRAVENOUS | Status: AC
  Administered 2011-10-22: 1 g via INTRAVENOUS

## 2011-10-22 MED ORDER — GLYCOPYRROLATE 0.2 MG/ML IJ SOLN
INTRAMUSCULAR | Status: AC
  Filled 2011-10-22: qty 3

## 2011-10-22 MED ORDER — FENTANYL CITRATE 0.05 MG/ML IJ SOLN
INTRAMUSCULAR | Status: DC | PRN
  Administered 2011-10-22: 100 ug via INTRAVENOUS
  Administered 2011-10-22 (×3): 50 ug via INTRAVENOUS

## 2011-10-22 MED ORDER — LIDOCAINE HCL (PF) 1 % IJ SOLN
INTRAMUSCULAR | Status: AC
  Filled 2011-10-22: qty 5

## 2011-10-22 MED ORDER — MORPHINE SULFATE 2 MG/ML IJ SOLN: 1.0000 mg | INTRAMUSCULAR | Status: DC | PRN

## 2011-10-22 MED ORDER — ENOXAPARIN SODIUM 40 MG/0.4ML ~~LOC~~ SOLN
40.0000 mg | SUBCUTANEOUS | Status: DC
  Administered 2011-10-23 – 2011-10-26 (×4): 40 mg via SUBCUTANEOUS
  Filled 2011-10-22 (×4): qty 0.4

## 2011-10-22 MED ORDER — NEOSTIGMINE METHYLSULFATE 1 MG/ML IJ SOLN
INTRAMUSCULAR | Status: DC | PRN
  Administered 2011-10-22: 3 mg via INTRAVENOUS

## 2011-10-22 MED ORDER — ROCURONIUM BROMIDE 50 MG/5ML IV SOLN
INTRAVENOUS | Status: AC
  Filled 2011-10-22: qty 1

## 2011-10-22 MED ORDER — FENTANYL CITRATE 0.05 MG/ML IJ SOLN
INTRAMUSCULAR | Status: AC
Start: 2011-10-22 — End: 2011-10-22
  Filled 2011-10-22: qty 2

## 2011-10-22 MED ORDER — SODIUM CHLORIDE 0.9 % IJ SOLN
INTRAMUSCULAR | Status: AC
  Administered 2011-10-22: 3 mL
  Filled 2011-10-22: qty 3

## 2011-10-22 MED ORDER — ETOMIDATE 2 MG/ML IV SOLN
INTRAVENOUS | Status: DC | PRN
  Administered 2011-10-22: 10 mg via INTRAVENOUS

## 2011-10-22 MED ORDER — ARTIFICIAL TEARS OP OINT
TOPICAL_OINTMENT | OPHTHALMIC | Status: DC | PRN
  Administered 2011-10-22: 1 via OPHTHALMIC

## 2011-10-22 SURGICAL SUPPLY — 56 items
APPLIER CLIP 11 MED OPEN (CLIP)
APPLIER CLIP 13 LRG OPEN (CLIP)
BAG HAMPER (MISCELLANEOUS) ×2 IMPLANT
BARRIER SKIN 2 3/4 (OSTOMY) IMPLANT
CELLS DAT CNTRL 66122 CELL SVR (MISCELLANEOUS) ×1 IMPLANT
CLAMP POUCH DRAINAGE QUIET (OSTOMY) IMPLANT
CLIP APPLIE 11 MED OPEN (CLIP) IMPLANT
CLIP APPLIE 13 LRG OPEN (CLIP) IMPLANT
CLOTH BEACON ORANGE TIMEOUT ST (SAFETY) ×2 IMPLANT
COVER LIGHT HANDLE STERIS (MISCELLANEOUS) ×4 IMPLANT
DRAPE WARM FLUID 44X44 (DRAPE) ×2 IMPLANT
DURAPREP 26ML APPLICATOR (WOUND CARE) ×2 IMPLANT
ELECT BLADE 6 FLAT ULTRCLN (ELECTRODE) ×2 IMPLANT
ELECT REM PT RETURN 9FT ADLT (ELECTROSURGICAL) ×2
ELECTRODE REM PT RTRN 9FT ADLT (ELECTROSURGICAL) ×1 IMPLANT
FORMALIN 10 PREFIL 480ML (MISCELLANEOUS) IMPLANT
GLOVE BIO SURGEON STRL SZ7.5 (GLOVE) ×4 IMPLANT
GOWN STRL REIN XL XLG (GOWN DISPOSABLE) ×6 IMPLANT
HEMOSTAT SURGICEL 4X8 (HEMOSTASIS) ×2 IMPLANT
INST SET MAJOR GENERAL (KITS) ×2 IMPLANT
KIT ROOM TURNOVER APOR (KITS) ×2 IMPLANT
LIGASURE IMPACT 36 18CM CVD LR (INSTRUMENTS) ×2 IMPLANT
MANIFOLD NEPTUNE II (INSTRUMENTS) ×2 IMPLANT
NS IRRIG 1000ML POUR BTL (IV SOLUTION) ×4 IMPLANT
PACK ABDOMINAL MAJOR (CUSTOM PROCEDURE TRAY) ×2 IMPLANT
PAD ARMBOARD 7.5X6 YLW CONV (MISCELLANEOUS) ×2 IMPLANT
POUCH OSTOMY 2 3/4  H 3804 (WOUND CARE)
POUCH OSTOMY 2 PC DRNBL 2.25 (WOUND CARE) IMPLANT
POUCH OSTOMY 2 PC DRNBL 2.75 (WOUND CARE) IMPLANT
POUCH OSTOMY DRNBL 2 1/4 (WOUND CARE)
RELOAD LINEAR CUT PROX 55 BLUE (ENDOMECHANICALS) IMPLANT
RELOAD PROXIMATE 75MM BLUE (ENDOMECHANICALS) ×6 IMPLANT
RETRACTOR WND ALEXIS 25 LRG (MISCELLANEOUS) IMPLANT
RTRCTR WOUND ALEXIS 18CM MED (MISCELLANEOUS) ×2
RTRCTR WOUND ALEXIS 25CM LRG (MISCELLANEOUS)
SET BASIN LINEN APH (SET/KITS/TRAYS/PACK) ×2 IMPLANT
SPONGE GAUZE 4X4 12PLY (GAUZE/BANDAGES/DRESSINGS) ×2 IMPLANT
SPONGE LAP 18X18 X RAY DECT (DISPOSABLE) IMPLANT
SPONGE SURGIFOAM ABS GEL 100 (HEMOSTASIS) ×2 IMPLANT
STAPLER GUN LINEAR PROX 60 (STAPLE) ×2 IMPLANT
STAPLER PROXIMATE 55 BLUE (STAPLE) IMPLANT
STAPLER PROXIMATE 75MM BLUE (STAPLE) ×2 IMPLANT
STAPLER VISISTAT (STAPLE) IMPLANT
SUCTION POOLE TIP (SUCTIONS) ×2 IMPLANT
SUT CHROMIC 0 SH (SUTURE) ×2 IMPLANT
SUT CHROMIC 3 0 SH 27 (SUTURE) ×2 IMPLANT
SUT NOVA NAB GS-26 0 60 (SUTURE) IMPLANT
SUT PDS AB 0 CTX 60 (SUTURE) IMPLANT
SUT SILK 2 0 (SUTURE)
SUT SILK 2 0 REEL (SUTURE) IMPLANT
SUT SILK 2-0 18XBRD TIE 12 (SUTURE) IMPLANT
SUT SILK 3 0 SH CR/8 (SUTURE) ×4 IMPLANT
TAPE CLOTH SURG 4X10 WHT LF (GAUZE/BANDAGES/DRESSINGS) ×2 IMPLANT
TOWEL BLUE STERILE X RAY DET (MISCELLANEOUS) ×2 IMPLANT
TOWEL OR 17X26 4PK STRL BLUE (TOWEL DISPOSABLE) ×2 IMPLANT
TRAY FOLEY CATH 14FR (SET/KITS/TRAYS/PACK) ×2 IMPLANT

## 2011-10-22 NOTE — Preoperative (Signed)
Beta Blockers   Reason not to administer Beta Blockers:Not Applicable 

## 2011-10-22 NOTE — Interval H&P Note (Signed)
History and Physical Interval Note:  10/22/2011 7:29 AM  Sandra Williams  has presented today for surgery, with the diagnosis of colon cancer  The various methods of treatment have been discussed with the patient and family. After consideration of risks, benefits and other options for treatment, the patient has consented to  Procedure(s) (LRB) with comments: PARTIAL COLECTOMY (N/A) as a surgical intervention .  The patient's history has been reviewed, patient examined, no change in status, stable for surgery.  I have reviewed the patient's chart and labs.  Questions were answered to the patient's satisfaction.     Franky Macho A

## 2011-10-22 NOTE — Anesthesia Postprocedure Evaluation (Signed)
  Anesthesia Post-op Note  Patient: Sandra Williams  Procedure(s) Performed: Procedure(s) (LRB) with comments: PARTIAL COLECTOMY (N/A)  Patient Location: PACU  Anesthesia Type: General  Level of Consciousness: awake, alert  and oriented  Airway and Oxygen Therapy: Patient Spontanous Breathing and Patient connected to face mask oxygen  Post-op Pain: none  Post-op Assessment: Post-op Vital signs reviewed, Patient's Cardiovascular Status Stable, Respiratory Function Stable, Patent Airway, No signs of Nausea or vomiting and Pain level controlled  Post-op Vital Signs: Reviewed and stable  Complications: No apparent anesthesia complications

## 2011-10-22 NOTE — Op Note (Signed)
Patient:  Sandra Williams  DOB:  October 06, 1925  MRN:  [id number]   Preop Diagnosis:  Right colon carcinoma  Postop Diagnosis:  Same  Procedure:  Right hemicolectomy  Surgeon:  Franky Macho, M.D.  Anes:  General endotracheal  Indications:  Patient is an 78 year old black female who was found at colonoscopy to have a cecal mass. This is biopsy-proven to be malignant. The patient now comes to the operating room for a right hemicolectomy. The risks and benefits of the procedure including bleeding, infection, cardiopulmonary difficulties, and a possibly of a blood transfusion were fully explained to the patient, gave informed consent.  Procedure note:  The patient was placed in the supine position. After induction of general endotracheal anesthesia, the abdomen was prepped and draped using usual sterile technique with DuraPrep. Surgical site confirmation was performed.  A right paramedian incision was made down to the peritoneal cavity. The peritoneal cavity was entered into without difficulty. There was some adhesions of omentum to the abdominal wall. These were lysed using Bovie electrocautery. The liver was inspected and noted to be within normal limits. The right colon was mobilized along its peritoneal reflection. The dissection was taken around the hepatic flexure. A small capsular tear in the tip of the liver was found and controlled using Bovie electrocautery. A GIA stapler was placed across the mid  transverse colon and fired. This was likewise done across the terminal ileum. The mesentery was then divided using the LigaSure. The specimen was sent to pathology for further examination. A side to side ileocolic anastomosis was then performed using a GIA 75 stapler. The enterotomy was closed using a TA 60 stapler. The stapler was posterior using 3-0 silk sutures. Surrounding omentum was then placed over the anastomosis and secured using 3-0 silk sutures. The mesenteric defect was closed using a  2-0 chromic gut running suture. The abdominal cavity was then copiously irrigated normal saline. All operating room personnel changed their gloves.  Surgicel and Gelfoam were then packed into the right upper quadrant. The bowel was returned into the abdominal cavity and orderly fashion. The fascia was reapproximated using a looped 0 Novafil running suture. The subcutaneous layer was irrigated normal saline and skin was closed using staples. Betadine ointment dorsal dressings were applied.  All tape and needle counts were correct at the end of the procedure. The patient was extubated in the operating room and went back to recovery room awake in stable condition.  Complications:  None  EBL:  50 cc  Specimen:  Right colon with terminal ileum

## 2011-10-22 NOTE — Anesthesia Preprocedure Evaluation (Signed)
Anesthesia Evaluation  Patient identified by MRN, date of birth, ID band Patient awake    Reviewed: Allergy & Precautions, H&P , NPO status , Patient's Chart, lab work & pertinent test results  History of Anesthesia Complications Negative for: history of anesthetic complications  Airway Mallampati: II TM Distance: >3 FB     Dental  (+) Teeth Intact and Poor Dentition   Pulmonary neg pulmonary ROS,  breath sounds clear to auscultation        Cardiovascular hypertension, Pt. on medications Rhythm:Regular Rate:Normal     Neuro/Psych    GI/Hepatic GERD-  Medicated and Controlled,  Endo/Other    Renal/GU      Musculoskeletal  (+) Arthritis -, Osteoarthritis,    Abdominal   Peds  Hematology   Anesthesia Other Findings   Reproductive/Obstetrics                           Anesthesia Physical Anesthesia Plan  ASA: III  Anesthesia Plan: General   Post-op Pain Management:    Induction: Intravenous, Rapid sequence and Cricoid pressure planned  Airway Management Planned: Oral ETT  Additional Equipment:   Intra-op Plan:   Post-operative Plan: Extubation in OR  Informed Consent: I have reviewed the patients History and Physical, chart, labs and discussed the procedure including the risks, benefits and alternatives for the proposed anesthesia with the patient or authorized representative who has indicated his/her understanding and acceptance.     Plan Discussed with:   Anesthesia Plan Comments: (Amidate for induction )        Anesthesia Quick Evaluation

## 2011-10-22 NOTE — Transfer of Care (Signed)
Immediate Anesthesia Transfer of Care Note  Patient: Sandra Williams  Procedure(s) Performed: Procedure(s) (LRB) with comments: PARTIAL COLECTOMY (N/A)  Patient Location: PACU  Anesthesia Type: General  Level of Consciousness: awake and alert   Airway & Oxygen Therapy: Patient Spontanous Breathing and Patient connected to face mask oxygen  Post-op Assessment: Report given to PACU RN  Post vital signs: Reviewed and stable  Complications: No apparent anesthesia complications

## 2011-10-22 NOTE — Plan of Care (Signed)
Problem: Diagnosis - Type of Surgery Goal: General Surgical Patient Education (See Patient Education module for education specifics) Outcome: Progressing Partial colectomy  Problem: Phase I Progression Outcomes Goal: Incision/dressings dry and intact Outcome: Progressing Stain marked on drsg Goal: Voiding-avoid urinary catheter unless indicated Outcome: Not Met (add Reason) Foley

## 2011-10-23 DIAGNOSIS — D010 Carcinoma in situ of colon: Secondary | ICD-10-CM | POA: Diagnosis not present

## 2011-10-23 DIAGNOSIS — I10 Essential (primary) hypertension: Secondary | ICD-10-CM | POA: Diagnosis not present

## 2011-10-23 LAB — BASIC METABOLIC PANEL
CO2: 25 mEq/L (ref 19–32)
Glucose, Bld: 117 mg/dL — ABNORMAL HIGH (ref 70–99)
Potassium: 4 mEq/L (ref 3.5–5.1)
Sodium: 138 mEq/L (ref 135–145)

## 2011-10-23 LAB — CBC
Hemoglobin: 8.5 g/dL — ABNORMAL LOW (ref 12.0–15.0)
RBC: 3.77 MIL/uL — ABNORMAL LOW (ref 3.87–5.11)

## 2011-10-23 LAB — PHOSPHORUS: Phosphorus: 3.1 mg/dL (ref 2.3–4.6)

## 2011-10-23 MED ORDER — METHYLPREDNISOLONE SODIUM SUCC 40 MG IJ SOLR
INTRAMUSCULAR | Status: AC
  Administered 2011-10-23: 40 mg
  Filled 2011-10-23: qty 1

## 2011-10-23 MED ORDER — SODIUM CHLORIDE 0.9 % IJ SOLN
INTRAMUSCULAR | Status: AC
  Administered 2011-10-23: 13:00:00
  Filled 2011-10-23: qty 3

## 2011-10-23 MED ORDER — SODIUM CHLORIDE 0.9 % IJ SOLN
INTRAMUSCULAR | Status: AC
  Administered 2011-10-23: 10 mL
  Filled 2011-10-23: qty 3

## 2011-10-23 MED ORDER — LIDOCAINE HCL (PF) 1 % IJ SOLN
INTRAMUSCULAR | Status: AC
  Filled 2011-10-23: qty 10

## 2011-10-23 NOTE — Progress Notes (Signed)
1 Day Post-Op  Subjective: Comfortable. Pain is controlled pain medications. No chest pain. No shortness of breath. No nausea. Tolerating clear liquids. No flatus.  Objective: Vital signs in last 24 hours: Temp:  [97.6 F (36.4 C)-100.2 F (37.9 C)] 100.2 F (37.9 C) (10/19 0556) Pulse Rate:  [55-93] 93  (10/19 0556) Resp:  [12-16] 16  (10/19 0556) BP: (119-148)/(61-69) 119/68 mmHg (10/19 0556) SpO2:  [95 %-100 %] 95 % (10/19 0556) Weight:  [70.534 kg (155 lb 8 oz)] 70.534 kg (155 lb 8 oz) (10/19 0556) Last BM Date: 10/22/11  Intake/Output from previous day: 10/18 0701 - 10/19 0700 In: 1300 [I.V.:1300] Out: 1730 [Urine:1680; Blood:50] Intake/Output this shift:    General appearance: alert and no distress Resp: clear to auscultation bilaterally Cardio: regular rate and rhythm GI: Quiet, soft, moderate expected postoperative tenderness. Dressing is in place. There is some shadowing along the lower aspect but no evidence of any surrounding erythema. No peritoneal signs.  Lab Results:   [facility] 10/23/11 0640  WBC 10.7*  HGB 8.5*  HCT 27.9*  PLT 337   BMET  Basename 10/23/11 0640  NA 138  K 4.0  CL 105  CO2 25  GLUCOSE 117*  BUN 7  CREATININE 0.69  CALCIUM 8.5   PT/INR No results found for this basename: LABPROT:2,INR:2 in the last 72 hours ABG No results found for this basename: PHART:2,PCO2:2,PO2:2,HCO3:2 in the last 72 hours  Studies/Results: No results found.  Anti-infectives: Anti-infectives     Start     Dose/Rate Route Frequency Ordered Stop   10/22/11 0631   ertapenem (INVANZ) 1 g in sodium chloride 0.9 % 50 mL IVPB        1 g 100 mL/hr over 30 Minutes Intravenous On call to O.R. 10/22/11 0631 10/22/11 0755          Assessment/Plan: s/p Procedure(s) (LRB) with comments: PARTIAL COLECTOMY (N/A) Overall patient is doing well. Continue to await for increased bowel function. I did discuss with the patient her postoperative anemia he'll continue  to monitor this. Should her hemoglobin again dropped tomorrow we will plan to proceed with transfusion. Continued increase activity as tolerated.  LOS: 1 day    Sandra Williams C 10/23/2011

## 2011-10-23 NOTE — Progress Notes (Signed)
Subjective: Patient had hemicolectomy for ca of the colon. She is resting and doing better. No nausea or vomiting  Objective: Vital signs in last 24 hours: Temp:  [97.6 F (36.4 C)-100.2 F (37.9 C)] 100.2 F (37.9 C) (10/19 0556) Pulse Rate:  [50-93] 93  (10/19 0556) Resp:  [10-18] 16  (10/19 0556) BP: (119-154)/(42-70) 119/68 mmHg (10/19 0556) SpO2:  [95 %-100 %] 95 % (10/19 0556) Weight:  [66.7 kg (147 lb 0.8 oz)-70.534 kg (155 lb 8 oz)] 70.534 kg (155 lb 8 oz) (10/19 0556) Weight change:     Intake/Output from previous day: 10/18 0701 - 10/19 0700 In: 1300 [I.V.:1300] Out: 1730 [Urine:1680; Blood:50]  PHYSICAL EXAM General appearance: alert and no distress Resp: clear to auscultation bilaterally Cardio: S1, S2 normal GI: abnormal findings:  surgical site is dressed, mild tenderness on palpation, bowel sound is positive Extremities: extremities normal, atraumatic, no cyanosis or edema  Lab Results:    @labtest @ ABGS No results found for this basename: PHART,PCO2,PO2ART,TCO2,HCO3 in the last 72 hours CULTURES Recent Results (from the past 240 hour(s))  SURGICAL PCR SCREEN     Status: Normal   Collection Time   10/18/11 10:10 AM      Component Value Range Status Comment   MRSA, PCR NEGATIVE  NEGATIVE Final    Staphylococcus aureus NEGATIVE  NEGATIVE Final    Studies/Results: No results found.  Medications: I have reviewed the patient's current medications.  Assesment: 1. S/P Rt hemicolectomy 2. Ca of the colon 3. Hypertension Active Problems:  * No active hospital problems. *     Plan: As per surgery plan Continue regular treatment Cbc/bmp     LOS: 1 day   Sandra Williams 10/23/2011, 7:19 AM

## 2011-10-24 DIAGNOSIS — I10 Essential (primary) hypertension: Secondary | ICD-10-CM | POA: Diagnosis not present

## 2011-10-24 DIAGNOSIS — D010 Carcinoma in situ of colon: Secondary | ICD-10-CM | POA: Diagnosis not present

## 2011-10-24 LAB — CBC
HCT: 32.3 % — ABNORMAL LOW (ref 36.0–46.0)
Hemoglobin: 10 g/dL — ABNORMAL LOW (ref 12.0–15.0)
RDW: 18.3 % — ABNORMAL HIGH (ref 11.5–15.5)
WBC: 13.7 10*3/uL — ABNORMAL HIGH (ref 4.0–10.5)

## 2011-10-24 LAB — BASIC METABOLIC PANEL
BUN: 9 mg/dL (ref 6–23)
Chloride: 106 mEq/L (ref 96–112)
GFR calc Af Amer: >90 mL/min (ref >90)
Potassium: 3.7 mEq/L (ref 3.5–5.1)

## 2011-10-24 NOTE — Progress Notes (Signed)
Patient tolerated a soft diet for supper with no new symptoms. Patient just reports being sore at incision site when moving.

## 2011-10-24 NOTE — Progress Notes (Signed)
Pharmacy Brief Note - Alvimopan (Entereg)  The standing order set for alvimopan (Entereg) now includes an automatic order to discontinue the drug after the patient has had a bowel movement.  The change was approved by the Pharmacy & Therapeutics Committee and the Medical Executive Committee.    This patient has had a bowel movement documented by nursing.  Therefore, alvimopan has been discontinued.  If there are questions, please contact the pharmacy at [phone].  Thank you- Elson Clan, [facility] 10/24/2011 12:20 PM

## 2011-10-24 NOTE — Progress Notes (Signed)
Notified Dr. Leticia Penna of patients Sandra Williams (3 watery, dark red like old blood stools and one small brown watery stool) during AM rounds. No new orders. Will continue to monitor.

## 2011-10-24 NOTE — Progress Notes (Signed)
Subjective: Patient is tolerating liquid. Complains loose stool. No nausea or vomiting.   Objective: Vital signs in last 24 hours: Temp:  [98.3 F (36.8 C)-100.4 F (38 C)] 98.3 F (36.8 C) (10/20 0537) Pulse Rate:  [72-116] 103  (10/20 0537) Resp:  [18-20] 19  (10/20 0537) BP: (135-155)/(69-82) 151/82 mmHg (10/20 0537) SpO2:  [93 %-97 %] 97 % (10/20 0537) Weight change:  Last BM Date: 10/24/11  Intake/Output from previous day: 10/19 0701 - 10/20 0700 In: -  Out: 2550 [Urine:2550]  PHYSICAL EXAM General appearance: alert and no distress Resp: clear to auscultation bilaterally Cardio: S1, S2 normal GI: abnormal findings:  surgical site is dressed, mild tenderness on palpation, bowel sound is positive Extremities: extremities normal, atraumatic, no cyanosis or edema  Lab Results:    @labtest @ [facility] No results found for this basename: PHART,PCO2,PO2ART,TCO2,HCO3 in the last 72 hours CULTURES Recent Results (from the past 240 hour(s))  SURGICAL PCR SCREEN     Status: Normal   Collection Time   10/18/11 10:10 AM      Component Value Range Status Comment   MRSA, PCR NEGATIVE  NEGATIVE Final    Staphylococcus aureus NEGATIVE  NEGATIVE Final    Studies/Results: No results found.  Medications: I have reviewed the patient's current medications.  Assesment: 1. S/P Rt hemicolectomy 2. Ca of the colon 3. Hypertension Active Problems:  * No active hospital problems. *     Plan: As per surgery plan Continue regular treatment Cbc/bmp     LOS: 2 days   Sandra Williams 10/24/2011, 2:34 PM

## 2011-10-24 NOTE — Progress Notes (Signed)
2 Days Post-Op  Subjective: No nausea. Some colicky abdominal pain. Some loose stool. Positive flatus. Pain control. No fevers or chills.  Objective: Vital signs in last 24 hours: Temp:  [98.3 F (36.8 Williams)-100.4 F (38 Williams)] 98.3 F (36.8 Williams) (10/20 0537) Pulse Rate:  [72-116] 103  (10/20 0537) Resp:  [18-20] 19  (10/20 0537) BP: (135-155)/(69-82) 151/82 mmHg (10/20 0537) SpO2:  [93 %-97 %] 97 % (10/20 0537) Last BM Date: 10/24/11  Intake/Output from previous day: 10/19 0701 - 10/20 0700 In: -  Out: 2550 [Urine:2550] Intake/Output this shift: Total I/O In: 200 [P.O.:200] Out: -   General appearance: alert and no distress Resp: clear to auscultation bilaterally Cardio: regular rate and rhythm GI: Hyperactive bowel sounds, soft, flat, moderate expected postoperative tenderness. Incision is clean dry and intact. Staples are present. No peritoneal signs.  Lab Results:   Basename 10/24/11 0741 10/23/11 0640  WBC 13.7* 10.7*  HGB 10.0* 8.5*  HCT 32.3* 27.9*  PLT 417* 337   BMET  Basename 10/24/11 0741 10/23/11 0640  NA 141 138  K 3.7 4.0  CL 106 105  CO2 27 25  GLUCOSE 156* 117*  BUN 9 7  CREATININE 0.63 0.69  CALCIUM 9.1 8.5   PT/INR No results found for this basename: LABPROT:2,INR:2 in the last 72 hours [facility] No results found for this basename: PHART:2,PCO2:2,PO2:2,HCO3:2 in the last 72 hours  Studies/Results: No results found.  Anti-infectives: Anti-infectives     Start     Dose/Rate Route Frequency Ordered Stop   10/22/11 0631   ertapenem (INVANZ) 1 g in sodium chloride 0.9 % 50 mL IVPB        1 g 100 mL/hr over 30 Minutes Intravenous On call to O.R. 10/22/11 0631 10/22/11 0755          Assessment/Plan: s/p Procedure(s) (LRB) with comments: PARTIAL COLECTOMY (N/A) Improving bowel function. Her loose watery stools is likely related to some intraluminal blood. This appears to be resolving per patient's story and her nurses report. If she continues to  have loose stool I would recommend at that time checking for Williams. difficile this time my suspicion of a Williams. difficile infection is extremely low. Continue to advance diet. Continue increasing activity. Hopeful discharge in the next 40-72 hours depending on patient's continued progression  LOS: 2 days    Sandra Williams 10/24/2011

## 2011-10-24 NOTE — Progress Notes (Signed)
#  14 FR foley catheter removed without difficulty, patient voided on bed pan 100 ml with medium size soft ( dark brown) stool

## 2011-10-25 ENCOUNTER — Encounter (HOSPITAL_COMMUNITY): Payer: Self-pay | Admitting: General Surgery

## 2011-10-25 DIAGNOSIS — D010 Carcinoma in situ of colon: Secondary | ICD-10-CM | POA: Diagnosis not present

## 2011-10-25 DIAGNOSIS — I10 Essential (primary) hypertension: Secondary | ICD-10-CM | POA: Diagnosis not present

## 2011-10-25 LAB — BASIC METABOLIC PANEL
BUN: 12 mg/dL (ref 6–23)
CO2: 26 mEq/L (ref 19–32)
Chloride: 105 mEq/L (ref 96–112)
GFR calc non Af Amer: 77 mL/min — ABNORMAL LOW (ref >90)
Glucose, Bld: 152 mg/dL — ABNORMAL HIGH (ref 70–99)
Potassium: 4.1 mEq/L (ref 3.5–5.1)

## 2011-10-25 LAB — TYPE AND SCREEN
ABO/RH(D): O POS
Antibody Screen: NEGATIVE
Unit division: 0

## 2011-10-25 LAB — CBC
HCT: 31.4 % — ABNORMAL LOW (ref 36.0–46.0)
Hemoglobin: 9.7 g/dL — ABNORMAL LOW (ref 12.0–15.0)
MCHC: 30.9 g/dL (ref 30.0–36.0)
RBC: 4.23 MIL/uL (ref 3.87–5.11)

## 2011-10-25 MED ORDER — HYDROCODONE-ACETAMINOPHEN 5-325 MG PO TABS
1.0000 | ORAL_TABLET | ORAL | Status: DC | PRN
  Administered 2011-10-25 – 2011-10-26 (×2): 1 via ORAL
  Filled 2011-10-25 (×3): qty 1

## 2011-10-25 NOTE — Plan of Care (Signed)
Problem: Phase II Progression Outcomes Goal: Discharge plan established Outcome: Completed/Met Date Met:  10/25/11 Plans to return home at discharge.

## 2011-10-25 NOTE — Plan of Care (Signed)
Problem: Phase I Progression Outcomes Goal: Incision/dressings dry and intact Outcome: Completed/Met Date Met:  10/25/11 Dressing removed.  Open to air.  Staples intact.

## 2011-10-25 NOTE — Progress Notes (Signed)
3 Days Post-Op  Subjective: Tolerating regular diet well. No bowel movement yet.  Objective: Vital signs in last 24 hours: Temp:  [98.3 F (36.8 C)-98.5 F (36.9 C)] 98.3 F (36.8 C) (10/21 0411) Pulse Rate:  [88-115] 88  (10/21 0411) Resp:  [20] 20  (10/21 0411) BP: (132-136)/(66-73) 132/73 mmHg (10/21 0411) SpO2:  [93 %] 93 % (10/21 0411) Last BM Date: 10/24/11  Intake/Output from previous day: 10/20 0701 - 10/21 0700 In: 5883.3 [P.O.:640; I.V.:5243.3] Out: 200 [Urine:200] Intake/Output this shift: Total I/O In: -  Out: 350 [Urine:350]  General appearance: alert, cooperative and appears stated age Resp: clear to auscultation bilaterally Cardio: regular rate and rhythm, S1, S2 normal, no murmur, click, rub or gallop GI: Soft. Incision healing well. Occasional bowel sounds appreciated.  Lab Results:   Basename 10/24/11 0741 10/23/11 0640  WBC 13.7* 10.7*  HGB 10.0* 8.5*  HCT 32.3* 27.9*  PLT 417* 337   BMET  Basename 10/24/11 0741 10/23/11 0640  NA 141 138  K 3.7 4.0  CL 106 105  CO2 27 25  GLUCOSE 156* 117*  BUN 9 7  CREATININE 0.63 0.69  CALCIUM 9.1 8.5   PT/INR No results found for this basename: LABPROT:2,INR:2 in the last 72 hours  Studies/Results: No results found.  Anti-infectives: Anti-infectives     Start     Dose/Rate Route Frequency Ordered Stop   10/22/11 0631   ertapenem (INVANZ) 1 g in sodium chloride 0.9 % 50 mL IVPB        1 g 100 mL/hr over 30 Minutes Intravenous On call to O.R. 10/22/11 0631 10/22/11 0755          Assessment/Plan: s/p Procedure(s): PARTIAL COLECTOMY Impression: Stable, awaiting return of bowel function. Final pathology pending. Will ambulate today with assistance.  LOS: 3 days    Yariana Hoaglund A 10/25/2011

## 2011-10-25 NOTE — Plan of Care (Signed)
Problem: Phase I Progression Outcomes Goal: Voiding-avoid urinary catheter unless indicated Outcome: Completed/Met Date Met:  10/25/11 removed

## 2011-10-25 NOTE — Plan of Care (Signed)
Problem: Phase I Progression Outcomes Goal: OOB as tolerated unless otherwise ordered Outcome: Progressing Pt ambulating in room and to bathroom.

## 2011-10-25 NOTE — Progress Notes (Signed)
Pt states that she had a bowel movement this morning after breakfast.

## 2011-10-25 NOTE — Progress Notes (Signed)
Subjective: Patient is tolerating regular food. She is doing well. No complaint.  Objective: Vital signs in last 24 hours: Temp:  [98.3 F (36.8 C)-98.5 F (36.9 C)] 98.3 F (36.8 C) (10/21 0411) Pulse Rate:  [88-115] 88  (10/21 0411) Resp:  [20] 20  (10/21 0411) BP: (132-136)/(66-73) 132/73 mmHg (10/21 0411) SpO2:  [93 %] 93 % (10/21 0411) Weight change:  Last BM Date: 10/24/11  Intake/Output from previous day: 10/20 0701 - 10/21 0700 In: 5883.3 [P.O.:640; I.V.:5243.3] Out: 200 [Urine:200]  PHYSICAL EXAM General appearance: alert and no distress Resp: clear to auscultation bilaterally Cardio: S1, S2 normal GI: abnormal findings:  surgical site is dressed, mild tenderness on palpation, bowel sound is positive Extremities: extremities normal, atraumatic, no cyanosis or edema  Lab Results:    @labtest @ [facility] No results found for this basename: PHART,PCO2,PO2ART,TCO2,HCO3 in the last 72 hours CULTURES Recent Results (from the past 240 hour(s))  SURGICAL PCR SCREEN     Status: Normal   Collection Time   10/18/11 10:10 AM      Component Value Range Status Comment   MRSA, PCR NEGATIVE  NEGATIVE Final    Staphylococcus aureus NEGATIVE  NEGATIVE Final    Studies/Results: No results found.  Medications: I have reviewed the patient's current medications.  Assesment: 1. S/P Rt hemicolectomy 2. Ca of the colon 3. Hypertension Active Problems:  * No active hospital problems. *     Plan: As per surgery plan Continue regular treatment Supportive care.     LOS: 3 days   Sandra Williams 10/25/2011, 8:08 AM

## 2011-10-25 NOTE — Progress Notes (Signed)
Pt c/o moderate abdominal pain at incision site.  Pt only has Morphine available for severe pain.  Dr. Lovell Sheehan paged.  Dr. Lovell Sheehan returned page and gave order for Norco 5-325 mg 1 po every 4 hours as needed for pain.  Orders followed.

## 2011-10-26 DIAGNOSIS — I10 Essential (primary) hypertension: Secondary | ICD-10-CM | POA: Diagnosis not present

## 2011-10-26 DIAGNOSIS — D010 Carcinoma in situ of colon: Secondary | ICD-10-CM | POA: Diagnosis not present

## 2011-10-26 MED ORDER — HYDROCODONE-ACETAMINOPHEN 5-325 MG PO TABS: 1.0000 | ORAL_TABLET | Freq: Four times a day (QID) | ORAL | Status: DC | PRN

## 2011-10-26 NOTE — Progress Notes (Signed)
Pt's IV removed.  Pt tolerated well.  Pt and pt's daughter provided with d/c instructions and prescription.  Both verbalized understanding of d/c instructions with teach back.  Pt stable at time of d/c.  Pt transported via w/c by NT to main entrance for discharge.

## 2011-10-26 NOTE — Discharge Summary (Signed)
Physician Discharge Summary  Patient ID: Sandra Williams MRN: [id number] DOB/AGE: Sep 01, 1925 78 y.o.  Admit date: 10/22/2011 Discharge date: 10/26/2011  Admission Diagnoses: Colon carcinoma  Discharge Diagnoses: Colon carcinoma, T2, N0, M0   Active Problems:  * No active hospital problems. 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 She presented to the operating room on 10/22/2011 and underwent a right hemicolectomy. She tolerated the procedure well. Her postoperative course was notable for anemia secondary to acute surgical blood loss. She did not require blood transfusion. Her diet was advanced without difficulty once her bowel function returned. Final pathology revealed a T2, N0, M0 adenocarcinoma. The patient is being discharged home on 10/26/2011 and improving condition.  Treatments: surgery: Right hemicolectomy on 10/22/2011  Discharge Exam: Blood pressure 145/71, pulse 111, temperature 98 F (36.7 C), temperature source Oral, resp. rate 18, height 5\' 1"  (1.549 m), weight 70.534 kg (155 lb 8 oz), SpO2 94.00%. General appearance: alert, cooperative, appears stated age and no distress Resp: clear to auscultation bilaterally Cardio: regular rate and rhythm, S1, S2 normal, no murmur, click, rub or gallop GI: Soft, flat. Incision healing well.  Disposition: 01-Home or Self Care     Medication List     As of 10/26/2011 10:04 AM    TAKE these medications         CALCIUM 600 + D PO   Take 1 tablet by mouth daily.      HYDROcodone-acetaminophen 5-325 MG per tablet   Commonly known as: NORCO/VICODIN   Take 1 tablet by mouth every 6 (six) hours as needed for pain.      losartan 100 MG tablet   Commonly known as: COZAAR   Take 100 mg by mouth daily.      multivitamin with minerals Tabs   Take 1 tablet by mouth daily.      pantoprazole 40 MG tablet   Commonly known  as: PROTONIX   Take 1 tablet (40 mg total) by mouth daily at 12 noon.      pravastatin 40 MG tablet   Commonly known as: PRAVACHOL   Take 40 mg by mouth daily.           Follow-up Information    Follow up with Dalia Heading, MD. Schedule an appointment as soon as possible for a visit on 11/02/2011.   Contact information:   [address] [place] [address] [phone]          Signed: Franky Macho A 10/26/2011, 10:04 AM

## 2011-10-26 NOTE — Progress Notes (Signed)
Subjective: Patient feels much better. She has tolerated regular diet No new complaint. Objective: Vital signs in last 24 hours: Temp:  [98 F (36.7 C)] 98 F (36.7 C) (10/22 0519) Pulse Rate:  [84-102] 84  (10/22 0519) Resp:  [18] 18  (10/22 0519) BP: (125-127)/(70-71) 127/70 mmHg (10/22 0519) SpO2:  [93 %-94 %] 93 % (10/22 0519) Weight change:  Last BM Date: 10/25/11  Intake/Output from previous day: 10/21 0701 - 10/22 0700 In: 1590 [P.O.:440; I.V.:1150] Out: 1100 [Urine:1100]  PHYSICAL EXAM General appearance: alert and no distress Resp: clear to auscultation bilaterally Cardio: S1, S2 normal GI: abnormal findings:  surgical site is dressed, mild tenderness on palpation, bowel sound is positive Extremities: extremities normal, atraumatic, no cyanosis or edema  Lab Results:    @labtest @ [facility] No results found for this basename: PHART,PCO2,PO2ART,TCO2,HCO3 in the last 72 hours CULTURES Recent Results (from the past 240 hour(s))  SURGICAL PCR SCREEN     Status: Normal   Collection Time   10/18/11 10:10 AM      Component Value Range Status Comment   MRSA, PCR NEGATIVE  NEGATIVE Final    Staphylococcus aureus NEGATIVE  NEGATIVE Final    Studies/Results: No results found.  Medications: I have reviewed the patient's current medications.  Assesment: 1. S/P Rt hemicolectomy 2. Ca of the colon 3. Hypertension Active Problems:  * No active hospital problems. *     Plan: As per surgery plan Continue regular treatment Supportive care.     LOS: 4 days   Sandra Williams 10/26/2011, 8:10 AM

## 2011-12-21 DIAGNOSIS — I10 Essential (primary) hypertension: Secondary | ICD-10-CM | POA: Diagnosis not present

## 2011-12-21 DIAGNOSIS — D010 Carcinoma in situ of colon: Secondary | ICD-10-CM | POA: Diagnosis not present

## 2012-01-05 DIAGNOSIS — M25569 Pain in unspecified knee: Secondary | ICD-10-CM

## 2012-01-05 HISTORY — DX: Pain in unspecified knee: M25.569

## 2012-01-05 NOTE — Progress Notes (Signed)
UR Chart Review Completed  

## 2012-01-11 ENCOUNTER — Encounter (HOSPITAL_COMMUNITY): Payer: Medicare Other | Attending: Oncology | Admitting: Oncology

## 2012-01-11 ENCOUNTER — Encounter (HOSPITAL_COMMUNITY): Payer: Self-pay | Admitting: Oncology

## 2012-01-11 VITALS — BP 153/56 | HR 69 | Temp 98.2°F | Resp 20 | Ht 59.25 in | Wt 136.0 lb

## 2012-01-11 DIAGNOSIS — C189 Malignant neoplasm of colon, unspecified: Secondary | ICD-10-CM

## 2012-01-11 DIAGNOSIS — E785 Hyperlipidemia, unspecified: Secondary | ICD-10-CM | POA: Insufficient documentation

## 2012-01-11 DIAGNOSIS — R413 Other amnesia: Secondary | ICD-10-CM

## 2012-01-11 DIAGNOSIS — D509 Iron deficiency anemia, unspecified: Secondary | ICD-10-CM | POA: Insufficient documentation

## 2012-01-11 DIAGNOSIS — I10 Essential (primary) hypertension: Secondary | ICD-10-CM | POA: Insufficient documentation

## 2012-01-11 DIAGNOSIS — Z85038 Personal history of other malignant neoplasm of large intestine: Secondary | ICD-10-CM | POA: Diagnosis not present

## 2012-01-11 DIAGNOSIS — Z09 Encounter for follow-up examination after completed treatment for conditions other than malignant neoplasm: Secondary | ICD-10-CM | POA: Insufficient documentation

## 2012-01-11 NOTE — Progress Notes (Signed)
Problem #1 stage I (T2, N0, M0) cancer the cecum status post resection by Dr. Lovell Sheehan with 14 lymph nodes found all of which were negative. She had no evidence for metastatic disease on CT scan of the abdomen and pelvis either. Her date of surgery was 10/22/2011 Problem #2 iron deficiency anemia at the time of presentation of number 1 Problem #3 memory loss over the last one to 3 years Problem #4 history of hysterectomy for benign disease Problem #5 history of appendectomy Problem #6 history of hypertension Problem #7 history of hyperlipidemia Problem #8 history of osteoporosis Problem #9 history of cataract extractions  Very pleasant 79 year old African American lady accompanied by her daughter from [place] who is here today for consultation about her stage 1 cancer of the cecum status post resection. She is recuperating from this very well. She still has some ice craving interestingly. At ice craving before her surgery when she was found to be significantly anemic. Her ferritin at that time was very low. She was transfused with 4 units of packed cells, 2 before and 2 during surgery. She lives here in [place] and resides close to her sister. Her sister has a history of lymphoma and has been my patient times many years. She herself remains free of disease. There is no family history of colon cancer however. She has 3 children alive in good health to the best of her knowledge. Her husband is deceased from complications of diabetes and renal failure. He died 6 years ago. She herself lives in [place] worked there for many years until returning to the [place] area. Oncology review of systems is negative. Memory loss review of systems reveals that she has not had a complete workup of her daughter requests 1. She is not a smoker nor drinker. Parents are both deceased. Her one sister still alive and in good health as mentioned above. Vital signs are recorded. BP 153/56  Pulse 69  Temp  98.2 F (36.8 C) (Oral)  Resp 20  Ht 4' 11.25" (1.505 m)  Wt 136 lb (61.689 kg)  BMI 27.24 kg/m2  Very pleasant woman in no acute distress. Lymph nodes are negative throughout. She has no thyromegaly. She is good repair of her teeth. Tongue is normal the midline. Pupils are hard to evaluate I believe because of prior surgery. They are small. EOMs appear intact. Facial symmetry is intact. Neck is supple. Lungs are clear. Heart shows a regular rhythm and rate without murmur rub or gallop. Abdomen shows scars which are well-healed. There is no organomegaly. Bowel sounds are normal to slightly decreased. Pulses are 1+ in her feet and symmetrical. She is right handed. Nails are unremarkable. She is alert and oriented but has difficulty with quite a number of memory questions. Her daughter answers many of her questions for her. She does not need interventional chemotherapy for this stage I cancer. She does need a workup for her memory loss. We will also do surveillance CEA levels and I suspect she will see Dr. Darrick Penna in followup for colonoscopy. We will see her in 6 months sooner if need be.

## 2012-01-11 NOTE — Patient Instructions (Addendum)
[facility] Discharge Instructions  RECOMMENDATIONS MADE BY THE CONSULTANT AND ANY TEST RESULTS WILL BE SENT TO YOUR REFERRING PHYSICIAN.  EXAM FINDINGS BY THE PHYSICIAN TODAY AND SIGNS OR SYMPTOMS TO REPORT TO CLINIC OR PRIMARY PHYSICIAN: exam and discussion by MD.  Bonita Quin have a low risk of recurrence.  Will check some blood work and if anything is abnormal we will call you.  MEDICATIONS PRESCRIBED:  none  INSTRUCTIONS GIVEN AND DISCUSSED: Report changes in bowel habits, blood in your stool, etc.  SPECIAL INSTRUCTIONS/FOLLOW-UP: Blood work tomorrow and MRI of your brain on 01/18/12.  Follow-up here to be arranged.  Thank you for choosing [facility] to provide your oncology and hematology care.  To afford each patient quality time with our providers, please arrive at least 15 minutes before your scheduled appointment time.  With your help, our goal is to use those 15 minutes to complete the necessary work-up to ensure our physicians have the information they need to help with your evaluation and healthcare recommendations.    Effective January 1st, 2014, we ask that you re-schedule your appointment with our physicians should you arrive 10 or more minutes late for your appointment.  We strive to give you quality time with our providers, and arriving late affects you and other patients whose appointments are after yours.    Again, thank you for choosing [facility].  Our hope is that these requests will decrease the amount of time that you wait before being seen by our physicians.       _____________________________________________________________  Should you have questions after your visit to [facility], please contact our office at [phone] between the hours of 8:30 a.m. and 5:00 p.m.  Voicemails left after 4:30 p.m. will not be returned until the following business day.  For prescription refill requests, have your pharmacy  contact our office with your prescription refill request.

## 2012-01-12 ENCOUNTER — Encounter (HOSPITAL_COMMUNITY): Payer: Medicare Other

## 2012-01-12 DIAGNOSIS — Z09 Encounter for follow-up examination after completed treatment for conditions other than malignant neoplasm: Secondary | ICD-10-CM | POA: Diagnosis not present

## 2012-01-12 DIAGNOSIS — D509 Iron deficiency anemia, unspecified: Secondary | ICD-10-CM | POA: Diagnosis not present

## 2012-01-12 DIAGNOSIS — C189 Malignant neoplasm of colon, unspecified: Secondary | ICD-10-CM

## 2012-01-12 DIAGNOSIS — E785 Hyperlipidemia, unspecified: Secondary | ICD-10-CM | POA: Diagnosis not present

## 2012-01-12 DIAGNOSIS — R413 Other amnesia: Secondary | ICD-10-CM

## 2012-01-12 DIAGNOSIS — Z85038 Personal history of other malignant neoplasm of large intestine: Secondary | ICD-10-CM | POA: Diagnosis not present

## 2012-01-12 DIAGNOSIS — I10 Essential (primary) hypertension: Secondary | ICD-10-CM | POA: Diagnosis not present

## 2012-01-12 LAB — CBC WITH DIFFERENTIAL/PLATELET
Basophils Relative: 1 % (ref 0–1)
Eosinophils Absolute: 0.2 10*3/uL (ref 0.0–0.7)
HCT: 33.1 % — ABNORMAL LOW (ref 36.0–46.0)
Hemoglobin: 10 g/dL — ABNORMAL LOW (ref 12.0–15.0)
Lymphs Abs: 1.6 10*3/uL (ref 0.7–4.0)
MCH: 22.6 pg — ABNORMAL LOW (ref 26.0–34.0)
MCHC: 30.2 g/dL (ref 30.0–36.0)
MCV: 74.9 fL — ABNORMAL LOW (ref 78.0–100.0)
Monocytes Absolute: 0.3 10*3/uL (ref 0.1–1.0)
Monocytes Relative: 7 % (ref 3–12)
RBC: 4.42 MIL/uL (ref 3.87–5.11)

## 2012-01-12 LAB — IRON AND TIBC
Iron: 21 ug/dL — ABNORMAL LOW (ref 42–135)
TIBC: 474 ug/dL — ABNORMAL HIGH (ref 250–470)
UIBC: 453 ug/dL — ABNORMAL HIGH (ref 125–400)

## 2012-01-12 LAB — FERRITIN: Ferritin: 11 ng/mL (ref 10–291)

## 2012-01-12 LAB — HOMOCYSTEINE: Homocysteine: 10.5 umol/L (ref 4.0–15.4)

## 2012-01-12 LAB — VITAMIN B12: Vitamin B-12: 694 pg/mL (ref 211–911)

## 2012-01-12 NOTE — Progress Notes (Signed)
Labs drawn today for tsh,homocysteine,methylmalonuc acid,folate,ferr,B12,Iron and IBC,cbc/diff,cea

## 2012-01-14 ENCOUNTER — Telehealth (HOSPITAL_COMMUNITY): Payer: Self-pay

## 2012-01-14 NOTE — Telephone Encounter (Signed)
Spoke with patient and daughter regarding iron deficiency.  At this time they prefer to take ferrous sulfate 325mg  daily and daughter will get the medication today for her to start.

## 2012-01-17 LAB — METHYLMALONIC ACID, SERUM: Methylmalonic Acid, Quantitative: 0.13 umol/L (ref <0.40)

## 2012-01-18 ENCOUNTER — Ambulatory Visit (HOSPITAL_COMMUNITY): Payer: Medicare Other

## 2012-01-21 ENCOUNTER — Ambulatory Visit (HOSPITAL_COMMUNITY)
Admission: RE | Admit: 2012-01-21 | Discharge: 2012-01-21 | Disposition: A | Discharge status: Home / Self Care | Payer: Medicare Other | Source: Ambulatory Visit | Attending: Oncology | Admitting: Oncology

## 2012-01-21 DIAGNOSIS — M5030 Other cervical disc degeneration, unspecified cervical region: Secondary | ICD-10-CM | POA: Insufficient documentation

## 2012-01-21 DIAGNOSIS — Z85038 Personal history of other malignant neoplasm of large intestine: Secondary | ICD-10-CM | POA: Diagnosis not present

## 2012-01-21 DIAGNOSIS — C189 Malignant neoplasm of colon, unspecified: Secondary | ICD-10-CM | POA: Diagnosis not present

## 2012-01-21 DIAGNOSIS — R413 Other amnesia: Secondary | ICD-10-CM | POA: Diagnosis not present

## 2012-02-01 ENCOUNTER — Encounter (HOSPITAL_BASED_OUTPATIENT_CLINIC_OR_DEPARTMENT_OTHER): Payer: Medicare Other | Admitting: Oncology

## 2012-02-01 ENCOUNTER — Encounter (HOSPITAL_COMMUNITY): Payer: Self-pay | Admitting: Oncology

## 2012-02-01 VITALS — BP 158/80 | HR 74 | Temp 97.6°F | Resp 18 | Wt 139.0 lb

## 2012-02-01 DIAGNOSIS — R413 Other amnesia: Secondary | ICD-10-CM

## 2012-02-01 DIAGNOSIS — D509 Iron deficiency anemia, unspecified: Secondary | ICD-10-CM | POA: Insufficient documentation

## 2012-02-01 DIAGNOSIS — C189 Malignant neoplasm of colon, unspecified: Secondary | ICD-10-CM

## 2012-02-01 DIAGNOSIS — Z85038 Personal history of other malignant neoplasm of large intestine: Secondary | ICD-10-CM

## 2012-02-01 HISTORY — DX: Malignant neoplasm of colon, unspecified: C18.9

## 2012-02-01 HISTORY — DX: Iron deficiency anemia, unspecified: D50.9

## 2012-02-01 NOTE — Progress Notes (Signed)
Sandra Gully, MD [address] [place] [address]  1. Colon cancer   2. Iron deficiency anemia     CURRENT THERAPY: Lab surveillance with CEAs  INTERVAL HISTORY: Sandra Williams 79 y.o. female returns for  regular  visit for followup of  Stage I (T2, N0, M0) cancer the cecum status post resection by Dr. Lovell Sheehan with 14 lymph nodes found all of which were negative. She had no evidence for metastatic disease on CT scan of the abdomen and pelvis either. Her date of surgery was 10/22/2011.  Not in need of chemotherapy for her stage I cancer.  AND  Iron deficiency anemia at the time of presentation.  I personally reviewed and went over laboratory results with the patient.  Labs were reviewed and she is noted to be iron deficient.  She was started on ferous sulfate 325 mg daily.   She is tolerating this very well.  She does admit to some darkening of her stools, but otherwise, she denies any nausea, vomiting, constipation, and abdominal pain.  She reports a cough that is non-productive and clear nasal discharge.  She denies any green/yellow sputum.  She denies nay headaches.  She denies any fevers or chills.  She will contact her PCP if she has sputum production, fevers, chills, etc.  I personally reviewed and went over radiographic studies with the patient.  MRI of brain is negative for any acute abnormalities that may explain the patient's recent memory loss.  Therefore, we will refer her to neurology for a consultation.   She denies any complaints.  Complete ROS questioning is otherwise negative.   Past Medical History  Diagnosis Date  . Osteoporosis   . Arthritis   . HTN (hypertension)   . Hyperlipidemia   . Cancer     cecal carcinoma  . GERD (gastroesophageal reflux disease)   . Colon cancer 02/01/2012    Stage I (T2, N0, M0) cancer the cecum status post resection by Dr. Lovell Sheehan with 14 lymph nodes found all of which were negative. She had no evidence for metastatic  disease on CT scan of the abdomen and pelvis either. Her date of surgery was 10/22/2011.  Not in need of chemotherapy for her stage I cancer.   . Iron deficiency anemia 02/01/2012    At time of colon cancer presentation    has HYPERLIPIDEMIA; HYPERTENSION, BENIGN ESSENTIAL; CONSTIPATION; OVERACTIVE BLADDER; ARTHRITIS; SHOULDER PAIN, RIGHT; KNEE PAIN, RIGHT; BACK PAIN; POSTMENOPAUSAL OSTEOPOROSIS; MEMORY LOSS; Colon cancer; and Iron deficiency anemia on her problem list.     is allergic to aspirin.  Sandra Williams does not currently have medications on file.  Past Surgical History  Procedure Date  . Abdominal hysterectomy     partial  . Cataract extraction   . Cesarean section   . Dental implant   . Appendectomy     [facility]  . Partial colectomy 10/22/2011    Procedure: PARTIAL COLECTOMY;  Surgeon: Sandra Heading, MD;  Location: [facility];  Service: General;  Laterality: N/A;    Denies any headaches, dizziness, double vision, fevers, chills, night sweats, nausea, vomiting, diarrhea, constipation, chest pain, heart palpitations, shortness of breath, blood in stool, black tarry stool, urinary pain, urinary burning, urinary frequency, hematuria.   PHYSICAL EXAMINATION  ECOG PERFORMANCE STATUS: 1 - Symptomatic but completely ambulatory  Filed Vitals:   02/01/12 1100  BP: 158/80  Pulse: 74  Temp: 97.6 F (36.4 C)  Resp: 18    GENERAL:alert, no distress, well nourished, well developed,  comfortable, cooperative and smiling SKIN: skin color, texture, turgor are normal, no rashes or significant lesions HEAD: Normocephalic, No masses, lesions, tenderness or abnormalities EYES: normal, Conjunctiva are pink and non-injected EARS: External ears normal OROPHARYNX:mucous membranes are moist  NECK: supple, no adenopathy, thyroid normal size, non-tender, without nodularity, no stridor, non-tender, trachea midline LYMPH:  no palpable lymphadenopathy, no hepatosplenomegaly BREAST:not examined LUNGS:  clear to auscultation and percussion HEART: regular rate & rhythm, no murmurs, no gallops, S1 normal and S2 normal ABDOMEN:abdomen soft, non-tender, obese, normal bowel sounds, no masses or organomegaly and no hepatosplenomegaly.  Abdominal surgical scars are well healed with abdominal disfiguring. BACK: Back symmetric, no curvature., No CVA tenderness EXTREMITIES:less then 2 second capillary refill, no joint deformities, effusion, or inflammation, no edema, no skin discoloration, no clubbing, no cyanosis  NEURO: alert & oriented x 3 with fluent speech, gait normal   LABORATORY DATA: CBC    Component Value Date/Time   WBC 5.0 01/12/2012 1215   RBC 4.42 01/12/2012 1215   HGB 10.0* 01/12/2012 1215   HCT 33.1* 01/12/2012 1215   PLT 390 01/12/2012 1215   MCV 74.9* 01/12/2012 1215   MCH 22.6* 01/12/2012 1215   MCHC 30.2 01/12/2012 1215   RDW 20.7* 01/12/2012 1215   LYMPHSABS 1.6 01/12/2012 1215   MONOABS 0.3 01/12/2012 1215   EOSABS 0.2 01/12/2012 1215   BASOSABS 0.0 01/12/2012 1215    Lab Results  Component Value Date   IRON 21* 01/12/2012   TIBC 474* 01/12/2012   FERRITIN 11 01/12/2012   Lab Results  Component Value Date   CEA 1.8 01/12/2012      RADIOGRAPHIC STUDIES: 01/21/2012  *RADIOLOGY REPORT*  Clinical Data: 79 year old female with increasing memory loss.  History of colon cancer in 2010. No known metastatic disease.  MRI HEAD WITHOUT CONTRAST  Technique: Multiplanar, multiecho pulse sequences of the brain and  surrounding structures were obtained according to standard protocol  without intravenous contrast.  Comparison: None.  Findings: Cerebral volume is within normal limits for age. No  restricted diffusion to suggest acute infarction. No midline  shift, mass effect, evidence of mass lesion, ventriculomegaly,  extra-axial collection or acute intracranial hemorrhage.  Cervicomedullary junction and pituitary are within normal limits.  Major intracranial vascular flow voids are preserved.  There is a  degree of intracranial artery dolichoectasia. Evidence of cervical  ICA tortuosity.  Scattered cerebral white matter T2 and FLAIR hyperintensity, mostly  periventricular, and mild for age. Deep gray matter nuclei,  brainstem, and cerebellum are within normal limits for age.  Advanced degenerative changes in the cervical spine at C3-C4 with  associated spinal stenosis, up to moderate with spinal cord mass  effect based on these images.  Postoperative changes to the globes. Minimal left ethmoid sinus  fluid or mucosal thickening. Other Visualized paranasal sinuses  and mastoids are clear. Negative scalp soft tissues.  IMPRESSION:  1. No acute intracranial abnormality. Mild nonspecific white  matter signal changes, most commonly due to chronic small vessel  disease.  2. Advanced disc and endplate degeneration in the cervical spine  C3-C4 with up to moderate spinal stenosis. Dedicated cervical  spine MRI would characterize further.  Original Report Authenticated By: Erskine Speed, M.D.      ASSESSMENT:  1. 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 Lovell Sheehan with 14 lymph nodes found all of which were negative. She had no evidence for metastatic disease on CT scan of the abdomen and pelvis  either. Her date of surgery was 10/22/2011  2. Iron deficiency anemia at the time of presentation of number 1.  Noted to be iron deficient with labs on 01/12/2012. Patient started on Ferrous Sulfate 325 mg daily. 3. Memory loss over the last one to 3 years.  MRI performed and no acute abnormalities noted other than age-related changes.  4. History of hysterectomy for benign disease  5. History of appendectomy  6. History of hypertension  7. History of hyperlipidemia  8. History of osteoporosis  9. History of cataract extractions   PLAN:  1. I personally reviewed and went over laboratory results with the patient. 2. I personally reviewed and went over radiographic  studies with the patient. 3. Labs added to scheduled lab appointments: CBC diff, Iron/TIBC, Ferritin 4. CEA every 3 months 5. Referral to Neurology for memory loss 6. Labs as scheduled 7. Patient may take Detrol as directed and refills will need to be from her PCP. 8. No indication for antibiotics at this time. 9. Continue ferrous sulfate 325 mg daily 10. Return as schedule.     All questions were answered. The patient knows to call the clinic with any problems, questions or concerns. We can certainly see the patient much sooner if necessary.  The patient and plan discussed with Glenford Peers, MD and he is in agreement with the aforementioned.  KEFALAS,THOMAS

## 2012-02-01 NOTE — Patient Instructions (Addendum)
[facility] Discharge Instructions  RECOMMENDATIONS MADE BY THE CONSULTANT AND ANY TEST RESULTS WILL BE SENT TO YOUR REFERRING PHYSICIAN.  Return to clinic as scheduled in April and June for lab work. Return to clinic as scheduled in July to see MD. Report any issues/concerns to clinic as needed prior to appointments.  Thank you for choosing [facility] to provide your oncology and hematology care.  To afford each patient quality time with our providers, please arrive at least 15 minutes before your scheduled appointment time.  With your help, our goal is to use those 15 minutes to complete the necessary work-up to ensure our physicians have the information they need to help with your evaluation and healthcare recommendations.    Effective January 1st, 2014, we ask that you re-schedule your appointment with our physicians should you arrive 10 or more minutes late for your appointment.  We strive to give you quality time with our providers, and arriving late affects you and other patients whose appointments are after yours.    Again, thank you for choosing [facility].  Our hope is that these requests will decrease the amount of time that you wait before being seen by our physicians.       _____________________________________________________________  Should you have questions after your visit to [facility], please contact our office at [phone] between the hours of 8:30 a.m. and 5:00 p.m.  Voicemails left after 4:30 p.m. will not be returned until the following business day.  For prescription refill requests, have your pharmacy contact our office with your prescription refill request.

## 2012-02-04 ENCOUNTER — Telehealth (HOSPITAL_COMMUNITY): Payer: Self-pay | Admitting: *Deleted

## 2012-02-08 DIAGNOSIS — J410 Simple chronic bronchitis: Secondary | ICD-10-CM | POA: Diagnosis not present

## 2012-02-09 ENCOUNTER — Ambulatory Visit (HOSPITAL_COMMUNITY)
Admission: RE | Admit: 2012-02-09 | Discharge: 2012-02-09 | Disposition: A | Discharge status: Home / Self Care | Payer: Medicare Other | Source: Ambulatory Visit | Attending: Internal Medicine | Admitting: Internal Medicine

## 2012-02-09 ENCOUNTER — Other Ambulatory Visit (HOSPITAL_COMMUNITY): Payer: Self-pay | Admitting: Internal Medicine

## 2012-02-09 DIAGNOSIS — J40 Bronchitis, not specified as acute or chronic: Secondary | ICD-10-CM

## 2012-02-09 DIAGNOSIS — I10 Essential (primary) hypertension: Secondary | ICD-10-CM | POA: Insufficient documentation

## 2012-02-09 DIAGNOSIS — R05 Cough: Secondary | ICD-10-CM | POA: Insufficient documentation

## 2012-02-09 DIAGNOSIS — Z8611 Personal history of tuberculosis: Secondary | ICD-10-CM | POA: Insufficient documentation

## 2012-02-09 DIAGNOSIS — R059 Cough, unspecified: Secondary | ICD-10-CM | POA: Insufficient documentation

## 2012-02-09 DIAGNOSIS — Z85038 Personal history of other malignant neoplasm of large intestine: Secondary | ICD-10-CM | POA: Diagnosis not present

## 2012-03-03 DIAGNOSIS — I10 Essential (primary) hypertension: Secondary | ICD-10-CM | POA: Diagnosis not present

## 2012-03-12 NOTE — Progress Notes (Signed)
TCS SEP 2014 DX: CECAL ADENO CA STAGE 1  REVIEWED.

## 2012-04-05 ENCOUNTER — Encounter (HOSPITAL_COMMUNITY): Payer: Medicare Other | Attending: Internal Medicine

## 2012-05-29 ENCOUNTER — Emergency Department (HOSPITAL_COMMUNITY)
Admission: EM | Admit: 2012-05-29 | Discharge: 2012-05-29 | Disposition: A | Discharge status: Home / Self Care | Payer: Medicare Other | Attending: Emergency Medicine | Admitting: Emergency Medicine

## 2012-05-29 ENCOUNTER — Encounter (HOSPITAL_COMMUNITY): Payer: Self-pay | Admitting: *Deleted

## 2012-05-29 DIAGNOSIS — R350 Frequency of micturition: Secondary | ICD-10-CM | POA: Insufficient documentation

## 2012-05-29 DIAGNOSIS — I10 Essential (primary) hypertension: Secondary | ICD-10-CM | POA: Insufficient documentation

## 2012-05-29 DIAGNOSIS — Z859 Personal history of malignant neoplasm, unspecified: Secondary | ICD-10-CM | POA: Insufficient documentation

## 2012-05-29 DIAGNOSIS — Z79899 Other long term (current) drug therapy: Secondary | ICD-10-CM | POA: Diagnosis not present

## 2012-05-29 DIAGNOSIS — Z9049 Acquired absence of other specified parts of digestive tract: Secondary | ICD-10-CM | POA: Diagnosis not present

## 2012-05-29 DIAGNOSIS — Z91199 Patient's noncompliance with other medical treatment and regimen due to unspecified reason: Secondary | ICD-10-CM | POA: Insufficient documentation

## 2012-05-29 DIAGNOSIS — Z8739 Personal history of other diseases of the musculoskeletal system and connective tissue: Secondary | ICD-10-CM | POA: Diagnosis not present

## 2012-05-29 DIAGNOSIS — E876 Hypokalemia: Secondary | ICD-10-CM | POA: Diagnosis not present

## 2012-05-29 DIAGNOSIS — R42 Dizziness and giddiness: Secondary | ICD-10-CM | POA: Insufficient documentation

## 2012-05-29 DIAGNOSIS — D509 Iron deficiency anemia, unspecified: Secondary | ICD-10-CM | POA: Insufficient documentation

## 2012-05-29 DIAGNOSIS — Z85038 Personal history of other malignant neoplasm of large intestine: Secondary | ICD-10-CM | POA: Insufficient documentation

## 2012-05-29 DIAGNOSIS — Z8719 Personal history of other diseases of the digestive system: Secondary | ICD-10-CM | POA: Diagnosis not present

## 2012-05-29 DIAGNOSIS — M25569 Pain in unspecified knee: Secondary | ICD-10-CM | POA: Insufficient documentation

## 2012-05-29 DIAGNOSIS — E785 Hyperlipidemia, unspecified: Secondary | ICD-10-CM | POA: Diagnosis not present

## 2012-05-29 DIAGNOSIS — R112 Nausea with vomiting, unspecified: Secondary | ICD-10-CM | POA: Diagnosis not present

## 2012-05-29 DIAGNOSIS — Z9119 Patient's noncompliance with other medical treatment and regimen: Secondary | ICD-10-CM | POA: Diagnosis not present

## 2012-05-29 LAB — CBC WITH DIFFERENTIAL/PLATELET
Basophils Absolute: 0 10*3/uL (ref 0.0–0.1)
Basophils Relative: 1 % (ref 0–1)
Hemoglobin: 12.2 g/dL (ref 12.0–15.0)
MCHC: 32.9 g/dL (ref 30.0–36.0)
Neutro Abs: 2.4 10*3/uL (ref 1.7–7.7)
Neutrophils Relative %: 43 % (ref 43–77)
RDW: 15.5 % (ref 11.5–15.5)

## 2012-05-29 LAB — URINE MICROSCOPIC-ADD ON

## 2012-05-29 LAB — COMPREHENSIVE METABOLIC PANEL
AST: 22 U/L (ref 0–37)
Albumin: 3.4 g/dL — ABNORMAL LOW (ref 3.5–5.2)
Alkaline Phosphatase: 74 U/L (ref 39–117)
Chloride: 106 mEq/L (ref 96–112)
Potassium: 3.1 mEq/L — ABNORMAL LOW (ref 3.5–5.1)
Sodium: 143 mEq/L (ref 135–145)
Total Bilirubin: 0.3 mg/dL (ref 0.3–1.2)

## 2012-05-29 LAB — URINALYSIS, ROUTINE W REFLEX MICROSCOPIC
Bilirubin Urine: NEGATIVE
Glucose, UA: NEGATIVE mg/dL
Ketones, ur: NEGATIVE mg/dL
Leukocytes, UA: NEGATIVE
pH: 6 (ref 5.0–8.0)

## 2012-05-29 MED ORDER — POTASSIUM CHLORIDE CRYS ER 20 MEQ PO TBCR
40.0000 meq | EXTENDED_RELEASE_TABLET | Freq: Once | ORAL | Status: AC
  Administered 2012-05-29: 40 meq via ORAL
  Filled 2012-05-29: qty 2

## 2012-05-29 NOTE — ED Provider Notes (Signed)
History  This chart was scribed for Loren Racer, MD by Bennett Scrape, ED Scribe. This patient was seen in room [facility]18[facility] and the patient's care was started at 8:10 PM.  CSN: [id number]  Arrival date & time 05/29/12  2001   First MD Initiated Contact with Patient 05/29/12 2010      Chief Complaint  Patient presents with  . Dizziness     The history is provided by the patient. No language interpreter was used.    HPI Comments: Sandra Williams is a 79 y.o. female who presents to the Emergency Department complaining of 3 to 4 days of gradual onset, intermittent dizziness episodes described as the room spinning with associated lightheadedness and emesis. The most recent episode occurred more than 12 hours ago with one associated episode of emesis. She denies dizziness currently. She reports that turning her head, standing up quickly and bending over aggravate the symptoms. She denies any recent medication changes. She also c/o right knee pain that is worse with ambulation that she attributes to arthritis. She is non-complaint with her lasix, because she states that it made her urinate too frequently. She denies any fevers, CP and abdominal pain as associated symptoms. She has a h/o HTN, HLD and colon CA with a colectomy. Pt denies smoking and alcohol use.   Past Medical History  Diagnosis Date  . Osteoporosis   . Arthritis   . HTN (hypertension)   . Hyperlipidemia   . Cancer     cecal carcinoma  . GERD (gastroesophageal reflux disease)   . Colon cancer 02/01/2012    Stage I (T2, N0, M0) cancer the cecum status post resection by Dr. Lovell Sheehan with 14 lymph nodes found all of which were negative. She had no evidence for metastatic disease on CT scan of the abdomen and pelvis either. Her date of surgery was 10/22/2011.  Not in need of chemotherapy for her stage I cancer.   . Iron deficiency anemia 02/01/2012    At time of colon cancer presentation    Past Surgical History   Procedure Laterality Date  . Abdominal hysterectomy      partial  . Cataract extraction    . Cesarean section    . Dental implant    . Appendectomy      [facility]  . Partial colectomy  10/22/2011    Procedure: PARTIAL COLECTOMY;  Surgeon: Dalia Heading, MD;  Location: [facility];  Service: General;  Laterality: N/A;    Family History  Problem Relation Age of Onset  . Breast cancer Daughter   . Hypertension Daughter   . Colon cancer Neg Hx   . Liver disease Neg Hx   . Hypertension Mother   . Cancer Sister     History  Substance Use Topics  . Smoking status: Never Smoker   . Smokeless tobacco: Never Used  . Alcohol Use: No    No OB history provided.  Review of Systems  Constitutional: Negative for fever.  Gastrointestinal: Positive for nausea and vomiting. Negative for abdominal pain.  Genitourinary: Positive for frequency (chronic). Negative for dysuria.  Musculoskeletal: Positive for arthralgias. Negative for back pain.  Neurological: Positive for dizziness and light-headedness. Negative for headaches.  All other systems reviewed and are negative.    Allergies  Aspirin  Home Medications   Current Outpatient Rx  Name  Route  Sig  Dispense  Refill  . Calcium Carbonate-Vitamin D (CALCIUM 600 + D PO)   Oral   Take 1 tablet by  mouth daily.         Marland Kitchen erythromycin ophthalmic ointment   Left Eye   Place into the left eye 2 (two) times daily.         . ferrous sulfate 325 (65 FE) MG tablet   Oral   Take 325 mg by mouth daily with breakfast.         . HYDROcodone-acetaminophen (NORCO) 5-325 MG per tablet   Oral   Take 1 tablet by mouth every 6 (six) hours as needed for pain.   40 tablet   0   . losartan (COZAAR) 100 MG tablet   Oral   Take 100 mg by mouth daily.         . Multiple Vitamin (MULTIVITAMIN WITH MINERALS) TABS   Oral   Take 1 tablet by mouth daily.         Bertram Gala Glycol-Propyl Glycol (EQ LUBRICANT EYE DROPS OP)   Ophthalmic   Apply  2 drops to eye 2 (two) times daily. Both eyes         . pravastatin (PRAVACHOL) 40 MG tablet   Oral   Take 40 mg by mouth daily.           Triage Vitals: BP 153/58  Pulse 83  Temp(Src) 97.7 F (36.5 C) (Oral)  Resp 16  Ht 5\' 4"  (1.626 m)  Wt 135 lb (61.236 kg)  BMI 23.16 kg/m2  SpO2 98%  Physical Exam  Nursing note and vitals reviewed. Constitutional: She is oriented to person, place, and time. She appears well-developed and well-nourished. No distress.  HENT:  Head: Normocephalic and atraumatic.  Eyes: EOM are normal.  Neck: Neck supple. No tracheal deviation present.  Cardiovascular: Normal rate, regular rhythm and normal heart sounds.   Pulmonary/Chest: Effort normal and breath sounds normal. No respiratory distress.  Abdominal: Soft. There is no tenderness.  Musculoskeletal: Normal range of motion. She exhibits edema (bilateral lower extremity edema, right greater than the left).  Neurological: She is alert and oriented to person, place, and time. No cranial nerve deficit.  Sensation is intact, normal finger to nose, equal grip strengths  Skin: Skin is warm and dry.  Psychiatric: She has a normal mood and affect. Her behavior is normal.    ED Course  Procedures (including critical care time)  Medications  potassium chloride SA (K-DUR,KLOR-CON) CR tablet 40 mEq (not administered)    DIAGNOSTIC STUDIES: Oxygen Saturation is 98% on room air, normal by my interpretation.    COORDINATION OF CARE: 8:16 PM-Discussed treatment plan which includes EKG, CBC panel, CMP and UA with pt at bedside and pt agreed to plan.   10:04 PM- Pt rechecked and is sitting on the side of her bed with no dizziness. Performed potassium therapy in the ED. Discussed discharge plan which includes following up with PCP with pt and pt agreed to plan. Addressed symptoms to return for with pt.   Labs Reviewed  COMPREHENSIVE METABOLIC PANEL - Abnormal; Notable for the following:    Potassium  3.1 (*)    Glucose, Bld 107 (*)    Albumin 3.4 (*)    GFR calc non Af Amer 63 (*)    GFR calc Af Amer 74 (*)    All other components within normal limits  URINALYSIS, ROUTINE W REFLEX MICROSCOPIC - Abnormal; Notable for the following:    Hgb urine dipstick TRACE (*)    All other components within normal limits  CBC WITH DIFFERENTIAL  TROPONIN I  URINE MICROSCOPIC-ADD ON     1. Dizziness   2. Hypokalemia      Date: 05/29/2012  Rate: 71  Rhythm: normal sinus rhythm  QRS Axis: normal  Intervals: normal  ST/T Wave abnormalities: normal  Conduction Disutrbances:none  Narrative Interpretation:   Old EKG Reviewed: unchanged    MDM  I personally performed the services described in this documentation, which was scribed in my presence. The recorded information has been reviewed and is accurate.        Loren Racer, MD 05/29/12 [phone]

## 2012-05-29 NOTE — ED Notes (Signed)
Dizzy x 3 days, nauseated and vomited x 1

## 2012-06-13 ENCOUNTER — Other Ambulatory Visit (HOSPITAL_COMMUNITY): Payer: Self-pay | Admitting: Internal Medicine

## 2012-06-13 ENCOUNTER — Ambulatory Visit (HOSPITAL_COMMUNITY)
Admission: RE | Admit: 2012-06-13 | Discharge: 2012-06-13 | Disposition: A | Discharge status: Home / Self Care | Payer: Medicare Other | Source: Ambulatory Visit | Attending: Internal Medicine | Admitting: Internal Medicine

## 2012-06-13 DIAGNOSIS — M25569 Pain in unspecified knee: Secondary | ICD-10-CM | POA: Insufficient documentation

## 2012-06-13 DIAGNOSIS — M1710 Unilateral primary osteoarthritis, unspecified knee: Secondary | ICD-10-CM | POA: Diagnosis not present

## 2012-06-13 DIAGNOSIS — I10 Essential (primary) hypertension: Secondary | ICD-10-CM | POA: Diagnosis not present

## 2012-06-13 DIAGNOSIS — M898X9 Other specified disorders of bone, unspecified site: Secondary | ICD-10-CM | POA: Diagnosis not present

## 2012-06-13 DIAGNOSIS — M25561 Pain in right knee: Secondary | ICD-10-CM

## 2012-06-13 DIAGNOSIS — M1990 Unspecified osteoarthritis, unspecified site: Secondary | ICD-10-CM | POA: Diagnosis not present

## 2012-06-13 DIAGNOSIS — E876 Hypokalemia: Secondary | ICD-10-CM | POA: Diagnosis not present

## 2012-06-27 ENCOUNTER — Ambulatory Visit (INDEPENDENT_AMBULATORY_CARE_PROVIDER_SITE_OTHER): Payer: Medicare Other | Admitting: Orthopedic Surgery

## 2012-06-27 VITALS — BP 142/70 | Ht 66.0 in | Wt 140.0 lb

## 2012-06-27 DIAGNOSIS — M1710 Unilateral primary osteoarthritis, unspecified knee: Secondary | ICD-10-CM

## 2012-06-27 DIAGNOSIS — M179 Osteoarthritis of knee, unspecified: Secondary | ICD-10-CM

## 2012-06-27 DIAGNOSIS — IMO0002 Reserved for concepts with insufficient information to code with codable children: Secondary | ICD-10-CM

## 2012-06-27 HISTORY — DX: Unilateral primary osteoarthritis, unspecified knee: M17.10

## 2012-06-27 HISTORY — DX: Osteoarthritis of knee, unspecified: M17.9

## 2012-06-27 NOTE — Patient Instructions (Addendum)
Wear and Tear Disorders of the Knee (Arthritis, Osteoarthritis) Everyone will experience wear and tear injuries (arthritis, osteoarthritis) of the knee. These are the changes we all get as we age. They come from the joint stress of daily living. The amount of cartilage damage in your knee and your symptoms determine if you need surgery. Mild problems require approximately two months recovery time. More severe problems take several months to recover. With mild problems, your surgeon may find worn and rough cartilage surfaces. With severe changes, your surgeon may find cartilage that has completely worn away and exposed the bone. Loose bodies of bone and cartilage, bone spurs (excess bone growth), and injuries to the menisci (cushions between the large bones of your leg) are also common. All of these problems can cause pain. For a mild wear and tear problem, rough cartilage may simply need to be shaved and smoothed. For more severe problems with areas of exposed bone, your surgeon may use an instrument for roughing up the bone surfaces to stimulate new cartilage growth. Loose bodies are usually removed. Torn menisci may be trimmed or repaired. ABOUT THE ARTHROSCOPIC PROCEDURE Arthroscopy is a surgical technique. It allows your orthopedic surgeon to diagnose and treat your knee injury with accuracy. The surgeon looks into your knee through a small scope. The scope is like a small (pencil-sized) telescope. Arthroscopy is less invasive than open knee surgery. You can expect a more rapid recovery. After the procedure, you will be moved to a recovery area until most of the effects of the medication have worn off. Your caregiver will discuss the test results with you. RECOVERY The severity of the arthritis and the type of procedure performed will determine recovery time. Other important factors include age, physical condition, medical conditions, and the type of rehabilitation program. Strengthening your muscles after  arthroscopy helps guarantee a better recovery. Follow your caregiver's instructions. Use crutches, rest, elevate, ice, and do knee exercises as instructed. Your caregivers will help you and instruct you with exercises and other physical therapy required to regain your mobility, muscle strength, and functioning following surgery. Only take over-the-counter or prescription medicines for pain, discomfort, or fever as directed by your caregiver.  SEEK MEDICAL CARE IF:   There is increased bleeding (more than a small spot) from the wound.  You notice redness, swelling, or increasing pain in the wound.  Pus is coming from wound.  You develop an unexplained oral temperature above 102 F (38.9 C) , or as your caregiver suggests.  You notice a foul smell coming from the wound or dressing.  You have severe pain with motion of the knee. SEEK IMMEDIATE MEDICAL CARE IF:   You develop a rash.  You have difficulty breathing.  You have any allergic problems. MAKE SURE YOU:   Understand these instructions.  Will watch your condition.  Will get help right away if you are not doing well or get worse. Document Released: 12/19/1999 Document Revised: 03/15/2011 Document Reviewed: 05/17/2007 [facility] Patient Information 2014 [place], [place]. Total Knee Replacement Total knee replacement is a procedure to replace your knee joint with an artificial knee joint (prosthetic knee joint). The purpose of this surgery is to reduce pain and improve your knee function. LET YOUR CAREGIVER KNOW ABOUT:   Any allergies you have.  Any medicines you are taking, including vitamins, herbs, eyedrops, over-the-counter medicines, and creams.  Any problems you have had with the use of anesthetics.  Family history of problems with the use of anesthetics.  Any  blood disorders you have, including bleeding problems or clotting problems.  Previous surgeries you have had. RISKS AND COMPLICATIONS  Generally, total knee  replacement is a safe procedure. However, as with any surgical procedure, complications can occur. Possible complications associated with total knee replacement include:  Loss of range of motion of the knee or instability.  Loosening of the prosthesis.  Infection.  Persistent pain. BEFORE THE PROCEDURE   Your caregiver will instruct you when you need to stop eating and drinking.  Ask your caregiver if you need to change or stop any regular medicines. PROCEDURE  Just before the procedure you will receive medicine that will make you drowsy (sedative). This will be given through a tube that is inserted into one of your veins (intravenous [IV] tube). Then you will either receive medicine to block pain from the waist down through your legs (spinal block) or medicine to also receive medicine to make you fall asleep (general anesthetic). You may also receive medicine to block feeling in your leg (nerve block) to help ease pain after surgery. An incision will be made in your knee. Your surgeon will take out any damaged cartilage and bone by sawing off the damaged surfaces. Then the surgeon will put a new metal liner over the sawed off portion of your thigh bone (femur) and a plastic liner over the sawed off portion of one of the bones of your lower leg (tibia). This is to restore alignment and function to your knee. A plastic piece is often used to restore the surface of your knee cap. AFTER THE PROCEDURE  You will be taken to the recovery area. You may have drainage tubes to drain excess fluid from your knee. These tubes attach to a device that removes these fluids. Once you are awake, stable, and taking fluids well, you will be taken to your hospital room. You will receive physical therapy as prescribed by your caregiver. The length of your stay in the hospital after a knee replacement is 2 4 days. Your surgeon may recommend that you spend time (usually an additional 10 14 days) in an extended-care  facility to help you begin walking again and improve your range of motion before you go home. You may also be prescribed blood-thinning medicine to decrease your risk of developing blood clots in your leg. Document Released: 03/29/2000 Document Revised: 06/22/2011 Document Reviewed: 01/31/2011 [facility] Patient Information 2014 [place], [place].

## 2012-06-28 ENCOUNTER — Encounter (HOSPITAL_COMMUNITY): Payer: Medicare Other | Attending: Oncology

## 2012-06-28 ENCOUNTER — Encounter: Payer: Self-pay | Admitting: Orthopedic Surgery

## 2012-06-28 DIAGNOSIS — R413 Other amnesia: Secondary | ICD-10-CM | POA: Insufficient documentation

## 2012-06-28 DIAGNOSIS — C189 Malignant neoplasm of colon, unspecified: Secondary | ICD-10-CM | POA: Insufficient documentation

## 2012-06-28 DIAGNOSIS — D509 Iron deficiency anemia, unspecified: Secondary | ICD-10-CM | POA: Insufficient documentation

## 2012-06-28 LAB — IRON AND TIBC: TIBC: 379 ug/dL (ref 250–470)

## 2012-06-28 LAB — CBC WITH DIFFERENTIAL/PLATELET
Basophils Absolute: 0 10*3/uL (ref 0.0–0.1)
Eosinophils Relative: 0 % (ref 0–5)
HCT: 37.6 % (ref 36.0–46.0)
Hemoglobin: 12.7 g/dL (ref 12.0–15.0)
Lymphocytes Relative: 26 % (ref 12–46)
Lymphs Abs: 2.3 10*3/uL (ref 0.7–4.0)
MCV: 86.8 fL (ref 78.0–100.0)
Monocytes Absolute: 0.8 10*3/uL (ref 0.1–1.0)
Monocytes Relative: 9 % (ref 3–12)
RDW: 14.3 % (ref 11.5–15.5)
WBC: 8.9 10*3/uL (ref 4.0–10.5)

## 2012-06-28 NOTE — Progress Notes (Signed)
Patient ID: Sandra Williams, female   DOB: Apr 12, 1925, 79 y.o.   MRN: [id number] Chief Complaint  Patient presents with  . Knee Pain    Right knee pain, no injury.   History 79 year-old female  complains of throbbing 8/10 intermittent right knee cramping and painwhich is improved with walking worse when she is sitting down or getting up from a chair she is intermittent swelling in the right knee she has mild functional deficits but nothing severe.  Review of systems history of fever diarrhea swelling.dizziness but otherwise negative  No allergies  Medications aspirin losartan and pravastatin  History of colorectal cancer with resection. Family history of arthritis and cancer. She is obviously retired does not smoke drink use caffeine.  BP 142/70  Ht 5\' 6"  (1.676 m)  Wt 140 lb (63.504 kg)  BMI 22.61 kg/m2 General appearance is normal, the patient is alert and oriented x3 with normal mood and affect. Upper extremity exam  The right and left upper extremity:   Inspection and palpation revealed no abnormalities in the upper extremities.   Range of motion is full without contracture.  Motor exam is normal with grade 5 strength.  The joints are fully reduced without subluxation.  There is no atrophy or tremor and muscle tone is normal.  All joints are stable.   Left lower extremity no tenderness or swelling full range of motion normal stability tests normal strength normal skin normal pulses normal sensation bilateral reflexes normal no lymphadenopathy and balance is normal  Right lower extremity she has tenderness over the medial and lateral compartments of the knee with crepitance on range of motion all ligaments are stable strength is intact no skin lesions were noted pulse and temperature are normal without edema. Swelling none. Lymph nodes negative. Sensation normal. Reflexes nonpathologic. Coordination normal.  X-rays show severe arthritis of the right knee consistent with  degenerative arthrosis.  Based on the patient's age functional deficit and pain she is a candidate for knee replacement surgery if she is willing to take the risks. I've spoken with her and her daughter they will get back to me after they've talked over with the family

## 2012-06-28 NOTE — Progress Notes (Signed)
Labs drawn today for cbc/diff,ferr,Iron and IBC,cea

## 2012-07-11 ENCOUNTER — Encounter (HOSPITAL_COMMUNITY): Payer: Medicare Other | Attending: Oncology

## 2012-07-11 ENCOUNTER — Encounter (HOSPITAL_COMMUNITY): Payer: Self-pay

## 2012-07-11 VITALS — BP 134/74 | HR 76 | Temp 98.4°F | Resp 18 | Wt 141.2 lb

## 2012-07-11 DIAGNOSIS — C189 Malignant neoplasm of colon, unspecified: Secondary | ICD-10-CM | POA: Insufficient documentation

## 2012-07-11 NOTE — Progress Notes (Signed)
History of present illness:  Patient returns to clinic today for laboratory work in routine followup of her stage I adenocarcinoma of the colon. Her only complaint today is of chronic right knee pain. She otherwise feels well. She has no neurologic complaints. She denies any recent fevers or illnesses. She was seen in the emergency room with dizziness approximately one month ago, but no etiology determined. She has a good appetite and denies weight loss. She has no chest pain or shortness of breath. She denies any nausea, vomiting, constipation, or diarrhea. She has no changes in her bowel movements and denies any melena or hematochezia. She has no urinary complaints. Patient otherwise feels well and offers no further specific complaints.  Review of systems: As per history of present illness, otherwise 10 system review is negative.  General:  Well-developed, well-nourished, no acute distress. Mental status: Normal affect. Eyes: Anicteric sclera. Respiratory: Clear to auscultation bilaterally. Cardiovascular: Regular rate and rhythm, no rubs, murmurs, or gallops. Gastrointestinal: Soft, nontender, nondistended, normoactive bowel sounds. Musculoskeletal: No edema. Skin: No rashes or petechiae noted. Neurological: Alert, answering all questions appropriately. Cranial nerves grossly intact.   Assessment and plan:  1. 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 Lovell Sheehan with 14 lymph nodes found all of which were negative. She had no evidence for metastatic disease on CT scan of the abdomen and pelvis either. Her date of surgery was 10/22/2011.  CEA continues to be within normal limits. Return to clinic in 6 months with lab work in routine evaluation. Patient understands he can return to clinic at any time if she has any questions, concerns, complaints. 2. Iron deficiency anemia: Hemoglobin is within normal limits. Iron saturation is slightly low, but the remainder of her iron  panel including ferritin is within normal limits. Patient has discontinued oral iron supplementation secondary to GI upset. Repeat iron stores in 6 months. If they continue to decline we'll consider IV Feraheme.

## 2012-07-11 NOTE — Patient Instructions (Addendum)
.  [facility] Discharge Instructions  RECOMMENDATIONS MADE BY THE CONSULTANT AND ANY TEST RESULTS WILL BE SENT TO YOUR REFERRING PHYSICIAN.  EXAM FINDINGS BY THE PHYSICIAN TODAY AND SIGNS OR SYMPTOMS TO REPORT TO CLINIC OR PRIMARY PHYSICIAN: Exam per Dr. Orlie Dakin Call us with change in bowel habits. Blood in stools or new onset of pain  SPECIAL INSTRUCTIONS/FOLLOW-UP: 6 months with labs then to see the doctor  Thank you for choosing [facility] to provide your oncology and hematology care.  To afford each patient quality time with our providers, please arrive at least 15 minutes before your scheduled appointment time.  With your help, our goal is to use those 15 minutes to complete the necessary work-up to ensure our physicians have the information they need to help with your evaluation and healthcare recommendations.    Effective January 1st, 2014, we ask that you re-schedule your appointment with our physicians should you arrive 10 or more minutes late for your appointment.  We strive to give you quality time with our providers, and arriving late affects you and other patients whose appointments are after yours.    Again, thank you for choosing [facility].  Our hope is that these requests will decrease the amount of time that you wait before being seen by our physicians.       _____________________________________________________________  Should you have questions after your visit to [facility], please contact our office at [phone] between the hours of 8:30 a.m. and 5:00 p.m.  Voicemails left after 4:30 p.m. will not be returned until the following business day.  For prescription refill requests, have your pharmacy contact our office with your prescription refill request.

## 2012-09-07 DIAGNOSIS — K219 Gastro-esophageal reflux disease without esophagitis: Secondary | ICD-10-CM | POA: Diagnosis not present

## 2012-09-07 DIAGNOSIS — R071 Chest pain on breathing: Secondary | ICD-10-CM | POA: Diagnosis not present

## 2012-09-08 ENCOUNTER — Other Ambulatory Visit: Payer: Self-pay

## 2012-09-08 ENCOUNTER — Emergency Department (HOSPITAL_COMMUNITY)
Admission: EM | Admit: 2012-09-08 | Discharge: 2012-09-08 | Disposition: A | Discharge status: Home / Self Care | Payer: Medicare Other | Attending: Emergency Medicine | Admitting: Emergency Medicine

## 2012-09-08 ENCOUNTER — Encounter (HOSPITAL_COMMUNITY): Payer: Medicare Other

## 2012-09-08 ENCOUNTER — Emergency Department (HOSPITAL_COMMUNITY): Payer: Medicare Other

## 2012-09-08 ENCOUNTER — Encounter (HOSPITAL_COMMUNITY): Payer: Self-pay | Admitting: *Deleted

## 2012-09-08 DIAGNOSIS — Z8719 Personal history of other diseases of the digestive system: Secondary | ICD-10-CM | POA: Diagnosis not present

## 2012-09-08 DIAGNOSIS — R079 Chest pain, unspecified: Secondary | ICD-10-CM

## 2012-09-08 DIAGNOSIS — I10 Essential (primary) hypertension: Secondary | ICD-10-CM | POA: Insufficient documentation

## 2012-09-08 DIAGNOSIS — Z8739 Personal history of other diseases of the musculoskeletal system and connective tissue: Secondary | ICD-10-CM | POA: Insufficient documentation

## 2012-09-08 DIAGNOSIS — Z85038 Personal history of other malignant neoplasm of large intestine: Secondary | ICD-10-CM | POA: Diagnosis not present

## 2012-09-08 DIAGNOSIS — R0789 Other chest pain: Secondary | ICD-10-CM | POA: Diagnosis not present

## 2012-09-08 DIAGNOSIS — E785 Hyperlipidemia, unspecified: Secondary | ICD-10-CM | POA: Insufficient documentation

## 2012-09-08 DIAGNOSIS — Z862 Personal history of diseases of the blood and blood-forming organs and certain disorders involving the immune mechanism: Secondary | ICD-10-CM | POA: Diagnosis not present

## 2012-09-08 DIAGNOSIS — J9819 Other pulmonary collapse: Secondary | ICD-10-CM | POA: Diagnosis not present

## 2012-09-08 DIAGNOSIS — Z79899 Other long term (current) drug therapy: Secondary | ICD-10-CM | POA: Insufficient documentation

## 2012-09-08 LAB — CBC WITH DIFFERENTIAL/PLATELET
Basophils Absolute: 0 10*3/uL (ref 0.0–0.1)
Basophils Relative: 0 % (ref 0–1)
Eosinophils Absolute: 0.1 10*3/uL (ref 0.0–0.7)
Eosinophils Relative: 3 % (ref 0–5)
HCT: 39.9 % (ref 36.0–46.0)
Hemoglobin: 13.2 g/dL (ref 12.0–15.0)
MCH: 29 pg (ref 26.0–34.0)
MCHC: 33.1 g/dL (ref 30.0–36.0)
MCV: 87.7 fL (ref 78.0–100.0)
Monocytes Absolute: 0.4 10*3/uL (ref 0.1–1.0)
Monocytes Relative: 8 % (ref 3–12)
RDW: 14.2 % (ref 11.5–15.5)

## 2012-09-08 LAB — BASIC METABOLIC PANEL
CO2: 25 mEq/L (ref 19–32)
Chloride: 108 mEq/L (ref 96–112)
GFR calc Af Amer: 68 mL/min — ABNORMAL LOW (ref >90)
Potassium: 3.6 mEq/L (ref 3.5–5.1)
Sodium: 142 mEq/L (ref 135–145)

## 2012-09-08 LAB — TROPONIN I: Troponin I: <0.3 ng/mL (ref <0.30)

## 2012-09-08 NOTE — Discharge Instructions (Signed)
Follow up with dr. Felecia Shelling as planned monday

## 2012-09-08 NOTE — ED Provider Notes (Signed)
CSN: [id number]     Arrival date & time 09/08/12  1341 History  This chart was scribed for Benny Lennert, MD by Quintella Reichert, ED scribe.  This patient was seen in room APA03/APA03 and the patient's care was started at 2:25 PM.    Chief Complaint  Patient presents with  . Chest Pain    Patient is a 79 y.o. female presenting with chest pain. The history is provided by the patient. No language interpreter was used.  Chest Pain Pain radiates to:  Does not radiate Pain severity:  Mild Onset quality:  Gradual Duration:  3 days Chronicity:  New Associated symptoms: no abdominal pain, no back pain, no cough, no diaphoresis, no fatigue, no headache and no shortness of breath   Associated symptoms comment:  Belching Risk factors: hypertension     HPI Comments: Mollyann Halbert is a 79 y.o. female with h/o GERD, HTN and hyperlipidemia sent from PCP to the Emergency Department complaining of 2-3 days of mild chest discomfort and belching.  Chest discomfort is worse in the morning and is not associated with SOB or diaphoresis.  Pt attributes her symptoms to gas but went to her PCP today and was advised to come to the ED for an EKG and labs.  She states that she otherwise feels well.  PCP is Dr. Felecia Shelling   Past Medical History  Diagnosis Date  . Osteoporosis   . Arthritis   . HTN (hypertension)   . Hyperlipidemia   . Cancer     cecal carcinoma  . GERD (gastroesophageal reflux disease)   . Colon cancer 02/01/2012    Stage I (T2, N0, M0) cancer the cecum status post resection by Dr. Lovell Sheehan with 14 lymph nodes found all of which were negative. She had no evidence for metastatic disease on CT scan of the abdomen and pelvis either. Her date of surgery was 10/22/2011.  Not in need of chemotherapy for her stage I cancer.   . Iron deficiency anemia 02/01/2012    At time of colon cancer presentation  . Knee joint pain 2014    right   Past Surgical History  Procedure Laterality Date  .  Abdominal hysterectomy      partial  . Cataract extraction    . Cesarean section    . Dental implant    . Appendectomy      [facility]  . Partial colectomy  10/22/2011    Procedure: PARTIAL COLECTOMY;  Surgeon: Dalia Heading, MD;  Location: [facility];  Service: General;  Laterality: N/A;   Family History  Problem Relation Age of Onset  . Breast cancer Daughter   . Hypertension Daughter   . Colon cancer Neg Hx   . Liver disease Neg Hx   . Hypertension Mother   . Cancer Sister    History  Substance Use Topics  . Smoking status: Never Smoker   . Smokeless tobacco: Never Used  . Alcohol Use: No   OB History   Grav Para Term Preterm Abortions TAB SAB Ect Mult Living                 Review of Systems  Constitutional: Negative for diaphoresis, appetite change and fatigue.  HENT: Negative for congestion, sinus pressure and ear discharge.   Eyes: Negative for discharge.  Respiratory: Negative for cough and shortness of breath.   Cardiovascular: Positive for chest pain.  Gastrointestinal: Negative for abdominal pain and diarrhea.  Genitourinary: Negative for frequency and hematuria.  Musculoskeletal: Negative for back pain.  Skin: Negative for rash.  Neurological: Negative for seizures and headaches.  Psychiatric/Behavioral: Negative for hallucinations.    Allergies  Aspirin  Home Medications   Current Outpatient Rx  Name  Route  Sig  Dispense  Refill  . calcium carbonate (OS-CAL) 600 MG TABS   Oral   Take 600 mg by mouth daily with breakfast.         . erythromycin ophthalmic ointment   Left Eye   Place into the left eye 2 (two) times daily.         Marland Kitchen HYDROcodone-acetaminophen (NORCO/VICODIN) 5-325 MG per tablet   Oral   Take 1 tablet by mouth every 6 (six) hours as needed for pain.         Marland Kitchen losartan (COZAAR) 100 MG tablet   Oral   Take 100 mg by mouth every morning.          . Multiple Vitamin (MULTIVITAMIN WITH MINERALS) TABS   Oral   Take 1 tablet by  mouth daily.         Bertram Gala Glycol-Propyl Glycol (EQ LUBRICANT EYE DROPS OP)   Ophthalmic   Apply 2 drops to eye 2 (two) times daily. Both eyes         . Potassium Gluconate 595 MG CAPS   Oral   Take 595 mg by mouth.         . pravastatin (PRAVACHOL) 40 MG tablet   Oral   Take 40 mg by mouth every morning.           BP 137/67  Pulse 90  Temp(Src) 98.4 F (36.9 C) (Oral)  Resp 20  Ht 5\' 4"  (1.626 m)  Wt 141 lb (63.957 kg)  BMI 24.19 kg/m2  SpO2 97%  Physical Exam  Nursing note and vitals reviewed. Constitutional: She is oriented to person, place, and time. She appears well-developed.  HENT:  Head: Normocephalic.  Eyes: Conjunctivae and EOM are normal. No scleral icterus.  Neck: Neck supple. No thyromegaly present.  Cardiovascular: Normal rate and regular rhythm.  Exam reveals no gallop and no friction rub.   No murmur heard. Pulmonary/Chest: No stridor. She has no wheezes. She has no rales. She exhibits no tenderness.  Abdominal: She exhibits no distension. There is no tenderness. There is no rebound.  Musculoskeletal: Normal range of motion. She exhibits no edema.  Lymphadenopathy:    She has no cervical adenopathy.  Neurological: She is oriented to person, place, and time. Coordination normal.  Skin: No rash noted. No erythema.  Psychiatric: She has a normal mood and affect. Her behavior is normal.    ED Course  Procedures (including critical care time)  DIAGNOSTIC STUDIES: Oxygen Saturation is 97% on room air, normal by my interpretation.    COORDINATION OF CARE: 2:28 PM: Discussed treatment plan which includes EKG and labs.  Pt expressed understanding and agreed to plan.   Labs Review Labs Reviewed  BASIC METABOLIC PANEL - Abnormal; Notable for the following:    Glucose, Bld 102 (*)    GFR calc non Af Amer 59 (*)    GFR calc Af Amer 68 (*)    All other components within normal limits  CBC WITH DIFFERENTIAL  TROPONIN I    Imaging  Review Dg Chest Port 1 View  09/08/2012   *RADIOLOGY REPORT*  Clinical Data: Shortness of breath  PORTABLE CHEST - 1 VIEW  Comparison: 02/09/2012  Findings: 1530 hours.  No edema or  focal airspace consolidation. No pleural effusion.  Minimal atelectasis seen at the left base. Cardiopericardial silhouette is at upper limits of normal for size. Imaged bony structures of the thorax are intact.  IMPRESSION: Left base atelectasis. Otherwise, no acute cardiopulmonary findings.   Original Report Authenticated By: Kennith Center, M.D.   Date: 09/08/2012  Rate:74  Rhythm: normal sinus rhythm  QRS Axis: normal  Intervals: normal  ST/T Wave abnormalities: nonspecific ST changes  Conduction Disutrbances:none  Narrative Interpretation:   Old EKG Reviewed: unchanged    MDM  No diagnosis found.   The chart was scribed for me under my direct supervision.  I personally performed the history, physical, and medical decision making and all procedures in the evaluation of this patient.Benny Lennert, MD 09/08/12 [phone]

## 2012-09-08 NOTE — ED Notes (Signed)
Chest discomfort for 3 days, Sent here for EKG

## 2012-09-08 NOTE — ED Notes (Signed)
MD at bedside. 

## 2012-09-11 DIAGNOSIS — K219 Gastro-esophageal reflux disease without esophagitis: Secondary | ICD-10-CM | POA: Diagnosis not present

## 2012-09-11 DIAGNOSIS — J410 Simple chronic bronchitis: Secondary | ICD-10-CM | POA: Diagnosis not present

## 2012-09-21 ENCOUNTER — Telehealth: Payer: Self-pay | Admitting: Orthopedic Surgery

## 2012-09-21 NOTE — Telephone Encounter (Signed)
Patient called, requests knee injection again, as said she does not wish to schedule surgery (total knee replacement), which is what was discussed at last office visit on 06/27/12.  Please advise as to whether patient can be scheduled for injection only.  Her ph#'s are:  (Cell) [phone], and Home,lives with sister, [phone].

## 2012-09-21 NOTE — Telephone Encounter (Signed)
Routing to DR. Harrison  

## 2012-09-21 NOTE — Telephone Encounter (Signed)
Yes sure 

## 2012-09-25 NOTE — Telephone Encounter (Signed)
Called patient. Appointment has been scheduled.

## 2012-09-25 NOTE — Telephone Encounter (Signed)
Routing back to [facility] to schedule her an appointment.

## 2012-10-10 ENCOUNTER — Ambulatory Visit (INDEPENDENT_AMBULATORY_CARE_PROVIDER_SITE_OTHER): Payer: Medicare Other | Admitting: Orthopedic Surgery

## 2012-10-10 ENCOUNTER — Encounter: Payer: Self-pay | Admitting: Orthopedic Surgery

## 2012-10-10 VITALS — BP 160/82 | Ht 66.0 in | Wt 140.0 lb

## 2012-10-10 DIAGNOSIS — M25569 Pain in unspecified knee: Secondary | ICD-10-CM | POA: Diagnosis not present

## 2012-10-10 DIAGNOSIS — M25561 Pain in right knee: Secondary | ICD-10-CM

## 2012-10-10 DIAGNOSIS — M1710 Unilateral primary osteoarthritis, unspecified knee: Secondary | ICD-10-CM | POA: Diagnosis not present

## 2012-10-10 NOTE — Patient Instructions (Addendum)
Can get injection in knee every three months only if needed

## 2012-10-10 NOTE — Progress Notes (Signed)
Patient ID: Tanisia Yokley, female   DOB: 10/28/1925, 79 y.o.   MRN: [id number]  Chief Complaint  Patient presents with  . Follow-up    Recheck rigth knee.    Patient requests injection right knee, did well with the first one  Complains of pain and difficulty getting out of a chair with anterior knee symptoms  Knee  Injection Procedure Note  Pre-operative Diagnosis: right knee oa  Post-operative Diagnosis: same  Indications: pain  Anesthesia: ethyl chloride   Procedure Details   Verbal consent was obtained for the procedure. Time out was completed.The joint was prepped with alcohol, followed by  Ethyl chloride spray and A 20 gauge needle was inserted into the knee via lateral approach; 4ml 1% lidocaine and 1 ml of depomedrol  was then injected into the joint . The needle was removed and the area cleansed and dressed.  Complications:  None; patient tolerated the procedure well.

## 2012-10-18 ENCOUNTER — Encounter (HOSPITAL_COMMUNITY): Payer: Self-pay | Admitting: Emergency Medicine

## 2012-10-18 ENCOUNTER — Emergency Department (HOSPITAL_COMMUNITY): Payer: Medicare Other

## 2012-10-18 ENCOUNTER — Emergency Department (HOSPITAL_COMMUNITY)
Admission: EM | Admit: 2012-10-18 | Discharge: 2012-10-18 | Disposition: A | Discharge status: Home / Self Care | Payer: Medicare Other | Attending: Emergency Medicine | Admitting: Emergency Medicine

## 2012-10-18 DIAGNOSIS — Z862 Personal history of diseases of the blood and blood-forming organs and certain disorders involving the immune mechanism: Secondary | ICD-10-CM | POA: Diagnosis not present

## 2012-10-18 DIAGNOSIS — Z79899 Other long term (current) drug therapy: Secondary | ICD-10-CM | POA: Diagnosis not present

## 2012-10-18 DIAGNOSIS — R079 Chest pain, unspecified: Secondary | ICD-10-CM

## 2012-10-18 DIAGNOSIS — E7800 Pure hypercholesterolemia, unspecified: Secondary | ICD-10-CM | POA: Diagnosis not present

## 2012-10-18 DIAGNOSIS — I10 Essential (primary) hypertension: Secondary | ICD-10-CM | POA: Insufficient documentation

## 2012-10-18 DIAGNOSIS — K219 Gastro-esophageal reflux disease without esophagitis: Secondary | ICD-10-CM | POA: Diagnosis not present

## 2012-10-18 DIAGNOSIS — Z85038 Personal history of other malignant neoplasm of large intestine: Secondary | ICD-10-CM | POA: Insufficient documentation

## 2012-10-18 DIAGNOSIS — M129 Arthropathy, unspecified: Secondary | ICD-10-CM | POA: Diagnosis not present

## 2012-10-18 DIAGNOSIS — R0789 Other chest pain: Secondary | ICD-10-CM | POA: Diagnosis not present

## 2012-10-18 LAB — CBC WITH DIFFERENTIAL/PLATELET
Basophils Absolute: 0 10*3/uL (ref 0.0–0.1)
Basophils Relative: 0 % (ref 0–1)
HCT: 37.8 % (ref 36.0–46.0)
Hemoglobin: 12.4 g/dL (ref 12.0–15.0)
Lymphocytes Relative: 39 % (ref 12–46)
MCHC: 32.8 g/dL (ref 30.0–36.0)
Monocytes Absolute: 0.5 10*3/uL (ref 0.1–1.0)
Monocytes Relative: 10 % (ref 3–12)
Neutro Abs: 2.5 10*3/uL (ref 1.7–7.7)
Neutrophils Relative %: 46 % (ref 43–77)
WBC: 5.4 10*3/uL (ref 4.0–10.5)

## 2012-10-18 LAB — BASIC METABOLIC PANEL
BUN: 13 mg/dL (ref 6–23)
CO2: 30 mEq/L (ref 19–32)
Chloride: 102 mEq/L (ref 96–112)
Creatinine, Ser: 0.73 mg/dL (ref 0.50–1.10)
GFR calc Af Amer: 88 mL/min — ABNORMAL LOW (ref >90)
Potassium: 3.5 mEq/L (ref 3.5–5.1)

## 2012-10-18 LAB — TROPONIN I: Troponin I: <0.3 ng/mL (ref <0.30)

## 2012-10-18 MED ORDER — NITROGLYCERIN 0.4 MG SL SUBL: 0.4000 mg | SUBLINGUAL_TABLET | Freq: Once | SUBLINGUAL | Status: DC

## 2012-10-18 MED ORDER — GI COCKTAIL ~~LOC~~
30.0000 mL | Freq: Once | ORAL | Status: AC
  Administered 2012-10-18: 30 mL via ORAL
  Filled 2012-10-18: qty 30

## 2012-10-18 MED ORDER — NITROGLYCERIN 0.4 MG SL SUBL
0.4000 mg | SUBLINGUAL_TABLET | SUBLINGUAL | Status: DC | PRN
  Administered 2012-10-18: 0.4 mg via SUBLINGUAL
  Filled 2012-10-18: qty 25

## 2012-10-18 NOTE — ED Notes (Signed)
Pt reports left sided chest pain that has been off & on for a week, pt states she is being treated by PCP for gas, pt states belching helps. Pain woke pt up this morning.

## 2012-10-18 NOTE — ED Provider Notes (Signed)
CSN: [id number]     Arrival date & time 10/18/12  0422 History   First MD Initiated Contact with Patient 10/18/12 0435     Chief Complaint  Patient presents with  . Chest Pain   (Consider location/radiation/quality/duration/timing/severity/associated sxs/prior Treatment) HPI Comments: Patient is an 79 year old female past medical history of hypertension and high cholesterol. She presents today with complaints of left-sided chest discomfort she states is been going off and on for a week. She states it woke her from sleep this morning and was much worse. She denies to me she is short of breath, nauseated, diaphoretic, and denies that the discomfort radiates to her arm or jaw. She denies any exertional component to this. She denies any cough, fever, or chills. She tells me she was seen by her primary Dr. earlier this week and was told it was likely gas.  Patient is a 79 y.o. female presenting with chest pain. The history is provided by the patient.  Chest Pain Pain location:  L chest Pain quality: tightness   Pain radiates to:  Does not radiate Pain radiates to the back: no   Pain severity:  Moderate Duration:  1 week Timing:  Intermittent Progression:  Worsening Chronicity:  New Context: not breathing and no movement   Relieved by:  Nothing Worsened by:  Nothing tried Ineffective treatments:  None tried Associated symptoms: no abdominal pain, no fatigue, no fever and no palpitations     Past Medical History  Diagnosis Date  . Osteoporosis   . Arthritis   . HTN (hypertension)   . Hyperlipidemia   . Cancer     cecal carcinoma  . GERD (gastroesophageal reflux disease)   . Colon cancer 02/01/2012    Stage I (T2, N0, M0) cancer the cecum status post resection by Dr. Lovell Sheehan with 14 lymph nodes found all of which were negative. She had no evidence for metastatic disease on CT scan of the abdomen and pelvis either. Her date of surgery was 10/22/2011.  Not in need of chemotherapy for her  stage I cancer.   . Iron deficiency anemia 02/01/2012    At time of colon cancer presentation  . Knee joint pain 2014    right   Past Surgical History  Procedure Laterality Date  . Abdominal hysterectomy      partial  . Cataract extraction    . Cesarean section    . Dental implant    . Appendectomy      [facility]  . Partial colectomy  10/22/2011    Procedure: PARTIAL COLECTOMY;  Surgeon: Dalia Heading, MD;  Location: [facility];  Service: General;  Laterality: N/A;   Family History  Problem Relation Age of Onset  . Breast cancer Daughter   . Hypertension Daughter   . Colon cancer Neg Hx   . Liver disease Neg Hx   . Hypertension Mother   . Cancer Sister    History  Substance Use Topics  . Smoking status: Never Smoker   . Smokeless tobacco: Never Used  . Alcohol Use: No   OB History   Grav Para Term Preterm Abortions TAB SAB Ect Mult Living                 Review of Systems  Constitutional: Negative for fever and fatigue.  Cardiovascular: Positive for chest pain. Negative for palpitations.  Gastrointestinal: Negative for abdominal pain.  All other systems reviewed and are negative.    Allergies  Aspirin  Home Medications  Current Outpatient Rx  Name  Route  Sig  Dispense  Refill  . aluminum & magnesium hydroxide-simethicone (MYLANTA) 500-450-40 MG/5ML suspension   Oral   Take by mouth every 6 (six) hours as needed for indigestion.         Marland Kitchen HYDROcodone-acetaminophen (NORCO/VICODIN) 5-325 MG per tablet   Oral   Take 1 tablet by mouth every 6 (six) hours as needed for pain.         Marland Kitchen losartan (COZAAR) 100 MG tablet   Oral   Take 100 mg by mouth every morning.          . Multiple Vitamin (MULTIVITAMIN WITH MINERALS) TABS   Oral   Take 1 tablet by mouth daily.         Marland Kitchen omeprazole (PRILOSEC) 20 MG capsule   Oral   Take 20 mg by mouth daily.         Marland Kitchen POTASSIUM PO   Oral   Take 1 tablet by mouth daily.         . pravastatin (PRAVACHOL) 40 MG  tablet   Oral   Take 40 mg by mouth every morning.           BP 170/69  Pulse 68  Temp(Src) 98.4 F (36.9 C) (Oral)  Resp 24  Ht 5\' 1"  (1.549 m)  Wt 147 lb (66.679 kg)  BMI 27.79 kg/m2  SpO2 100% Physical Exam  Nursing note and vitals reviewed. Constitutional: She is oriented to person, place, and time. She appears well-developed and well-nourished. No distress.  HENT:  Head: Normocephalic and atraumatic.  Neck: Normal range of motion. Neck supple.  Cardiovascular: Normal rate and regular rhythm.  Exam reveals no gallop and no friction rub.   No murmur heard. Pulmonary/Chest: Effort normal and breath sounds normal. No respiratory distress. She has no wheezes.  Abdominal: Soft. Bowel sounds are normal. She exhibits no distension. There is no tenderness.  Musculoskeletal: Normal range of motion.  Neurological: She is alert and oriented to person, place, and time.  Skin: Skin is warm and dry. She is not diaphoretic.    ED Course  Procedures (including critical care time) Labs Review Labs Reviewed  CBC WITH DIFFERENTIAL  BASIC METABOLIC PANEL  TROPONIN I  LIPASE, BLOOD   Imaging Review No results found.  EKG Interpretation     Ventricular Rate:  66 PR Interval:  190 QRS Duration: 82 QT Interval:  402 QTC Calculation: 421 R Axis:   -2 Text Interpretation:  Normal sinus rhythm Left ventricular hypertrophy Abnormal ECG When compared with ECG of 08-Sep-2012 14:07, No significant change was found            MDM  No diagnosis found. Patient is an 79 year old female who presents here with complaints of intermittent pains in the left side of her chest. Her description of this discomfort is atypical for cardiac pain seems to be worse when she moves her left arm. She also reports it is relieved with belching. She was seen here about a month ago workup was unremarkable and she was discharged. She was also seen recently by her primary care Dr. who did not feel as  though this was cardiac. It was worse when she woke up this morning she presents for evaluation.  Workup today reveals an unchanged EKG and negative troponin. Remainder of laboratory studies are unremarkable. She was given nitroglycerin which did not give her any relief. She was then given a GI cocktail which did seem to  help. It seems as though her symptoms are gastrointestinal or musculoskeletal in nature and I feel as though a cardiac etiology is less likely, however not completely ruled out. I have presented her with the option of admission for observation and further workup however she does not want to stay in the hospital. That is her preference to be discharged and followup with Dr. Felecia Shelling. She is advised me she will call today to arrange a followup appointment at which time she will discuss which direction to go with this complaint. I've discussed with her the risks and benefits of both admission and discharge and she understands these. She also promises that she will return to the ER if her symptoms worsen or change.    Geoffery Lyons, MD 10/18/12 [phone]

## 2012-10-18 NOTE — ED Notes (Signed)
Pt alert & oriented x4, stable gait. Patient  given discharge instructions, paperwork & prescription(s). Patient verbalized understanding. Pt left department w/ no further questions. 

## 2012-10-22 ENCOUNTER — Emergency Department (HOSPITAL_COMMUNITY): Payer: Medicare Other

## 2012-10-22 ENCOUNTER — Encounter (HOSPITAL_COMMUNITY): Payer: Self-pay | Admitting: Emergency Medicine

## 2012-10-22 ENCOUNTER — Observation Stay (HOSPITAL_COMMUNITY)
Admission: EM | Admit: 2012-10-22 | Discharge: 2012-10-23 | Disposition: A | Discharge status: Home / Self Care | Payer: Medicare Other | Attending: Internal Medicine | Admitting: Internal Medicine

## 2012-10-22 DIAGNOSIS — D509 Iron deficiency anemia, unspecified: Secondary | ICD-10-CM | POA: Diagnosis not present

## 2012-10-22 DIAGNOSIS — C180 Malignant neoplasm of cecum: Secondary | ICD-10-CM | POA: Diagnosis not present

## 2012-10-22 DIAGNOSIS — R0602 Shortness of breath: Secondary | ICD-10-CM | POA: Insufficient documentation

## 2012-10-22 DIAGNOSIS — K219 Gastro-esophageal reflux disease without esophagitis: Secondary | ICD-10-CM | POA: Diagnosis not present

## 2012-10-22 DIAGNOSIS — Z85038 Personal history of other malignant neoplasm of large intestine: Secondary | ICD-10-CM | POA: Insufficient documentation

## 2012-10-22 DIAGNOSIS — E785 Hyperlipidemia, unspecified: Secondary | ICD-10-CM | POA: Diagnosis not present

## 2012-10-22 DIAGNOSIS — R079 Chest pain, unspecified: Secondary | ICD-10-CM

## 2012-10-22 DIAGNOSIS — I10 Essential (primary) hypertension: Secondary | ICD-10-CM

## 2012-10-22 DIAGNOSIS — M810 Age-related osteoporosis without current pathological fracture: Secondary | ICD-10-CM

## 2012-10-22 DIAGNOSIS — M549 Dorsalgia, unspecified: Secondary | ICD-10-CM

## 2012-10-22 DIAGNOSIS — K5900 Constipation, unspecified: Secondary | ICD-10-CM

## 2012-10-22 DIAGNOSIS — M129 Arthropathy, unspecified: Secondary | ICD-10-CM | POA: Diagnosis not present

## 2012-10-22 DIAGNOSIS — Z888 Allergy status to other drugs, medicaments and biological substances status: Secondary | ICD-10-CM | POA: Insufficient documentation

## 2012-10-22 DIAGNOSIS — M179 Osteoarthritis of knee, unspecified: Secondary | ICD-10-CM

## 2012-10-22 DIAGNOSIS — N318 Other neuromuscular dysfunction of bladder: Secondary | ICD-10-CM

## 2012-10-22 DIAGNOSIS — M1710 Unilateral primary osteoarthritis, unspecified knee: Secondary | ICD-10-CM

## 2012-10-22 DIAGNOSIS — C189 Malignant neoplasm of colon, unspecified: Secondary | ICD-10-CM

## 2012-10-22 DIAGNOSIS — R413 Other amnesia: Secondary | ICD-10-CM

## 2012-10-22 HISTORY — DX: Chest pain, unspecified: R07.9

## 2012-10-22 LAB — BASIC METABOLIC PANEL
BUN: 12 mg/dL (ref 6–23)
Chloride: 103 mEq/L (ref 96–112)
GFR calc Af Amer: 87 mL/min — ABNORMAL LOW (ref >90)
GFR calc non Af Amer: 75 mL/min — ABNORMAL LOW (ref >90)
Potassium: 3.7 mEq/L (ref 3.5–5.1)
Sodium: 138 mEq/L (ref 135–145)

## 2012-10-22 LAB — TROPONIN I: Troponin I: <0.3 ng/mL (ref <0.30)

## 2012-10-22 LAB — CBC
HCT: 37.9 % (ref 36.0–46.0)
Hemoglobin: 13 g/dL (ref 12.0–15.0)
RBC: 4.39 MIL/uL (ref 3.87–5.11)

## 2012-10-22 MED ORDER — HYDROCODONE-ACETAMINOPHEN 5-325 MG PO TABS
1.0000 | ORAL_TABLET | Freq: Four times a day (QID) | ORAL | Status: DC | PRN
  Administered 2012-10-23: 1 via ORAL
  Filled 2012-10-22: qty 1

## 2012-10-22 MED ORDER — ALUM & MAG HYDROXIDE-SIMETH 500-450-40 MG/5ML PO SUSP: 15.0000 mL | Freq: Four times a day (QID) | ORAL | Status: DC | PRN

## 2012-10-22 MED ORDER — ONDANSETRON HCL 4 MG/2ML IJ SOLN: 4.0000 mg | Freq: Four times a day (QID) | INTRAMUSCULAR | Status: DC | PRN

## 2012-10-22 MED ORDER — GI COCKTAIL ~~LOC~~
30.0000 mL | Freq: Once | ORAL | Status: AC
  Administered 2012-10-22: 30 mL via ORAL
  Filled 2012-10-22: qty 30

## 2012-10-22 MED ORDER — LOSARTAN POTASSIUM 50 MG PO TABS
100.0000 mg | ORAL_TABLET | Freq: Every morning | ORAL | Status: DC
  Administered 2012-10-23: 100 mg via ORAL
  Filled 2012-10-22: qty 2

## 2012-10-22 MED ORDER — SIMVASTATIN 20 MG PO TABS
20.0000 mg | ORAL_TABLET | Freq: Every day | ORAL | Status: DC
  Filled 2012-10-22: qty 1

## 2012-10-22 MED ORDER — ALUM & MAG HYDROXIDE-SIMETH 200-200-20 MG/5ML PO SUSP
15.0000 mL | Freq: Four times a day (QID) | ORAL | Status: DC | PRN
  Administered 2012-10-23: 15 mL via ORAL
  Filled 2012-10-22: qty 30

## 2012-10-22 MED ORDER — ENOXAPARIN SODIUM 40 MG/0.4ML ~~LOC~~ SOLN
40.0000 mg | SUBCUTANEOUS | Status: DC
  Administered 2012-10-23: 40 mg via SUBCUTANEOUS
  Filled 2012-10-22 (×2): qty 0.4

## 2012-10-22 MED ORDER — PANTOPRAZOLE SODIUM 40 MG PO TBEC
40.0000 mg | DELAYED_RELEASE_TABLET | Freq: Every day | ORAL | Status: DC
  Administered 2012-10-23: 40 mg via ORAL
  Filled 2012-10-22: qty 1

## 2012-10-22 MED ORDER — ACETAMINOPHEN 325 MG PO TABS: 650.0000 mg | ORAL_TABLET | ORAL | Status: DC | PRN

## 2012-10-22 NOTE — ED Notes (Signed)
Presents with a few of left sided chest pain worse with movement with radiation to back described as "feels like gas pain and when I belch it gets a little better, but the gas medicine does not help" denies SOB, dizziness, nausea. Hydrocodone makes pain better.  Pain 10/10 and is intermittent

## 2012-10-22 NOTE — ED Notes (Signed)
Transporting patient to new room assignment. 

## 2012-10-22 NOTE — H&P (Signed)
[facility] History and Physical  Sandra Williams ZOX:[id number] DOB: 22-Jul-1925 DOA: 10/22/2012  Referring physician: ER physician. PCP: Avon Gully, MD   Chief Complaint: Chest pain.  HPI: Sandra Williams is a 79 y.o. female with known history of hypertension and hyperlipidemia and colon cancer status post right hemicolectomy presented to the ER because of chest pain. Patient has been having chest pain off and on for last one week. Pain is mostly in the mornings when she wakes up. Pain in the left anterior chest wall, dull aching sometimes radiating to the back. The chest pain gets better with PPI. Last Wednesday, 4 days ago she had gone to [facility] and had preliminary test done and discharged home. Since patient has had recurrent symptoms patient presented to the ER at [facility]. Cardiac markers and chest x-ray were unremarkable. Patient has been admitted for further management. Patient presently chest pain-free. Denies any exertional symptoms or shortness of breath cough or any fever chills nausea vomiting abdominal pain or diarrhea.   Review of Systems: As presented in the history of presenting illness, rest negative.  Past Medical History  Diagnosis Date  . Osteoporosis   . Arthritis   . HTN (hypertension)   . Hyperlipidemia   . Cancer     cecal carcinoma  . GERD (gastroesophageal reflux disease)   . Colon cancer 02/01/2012    Stage I (T2, N0, M0) cancer the cecum status post resection by Dr. Lovell Sheehan with 14 lymph nodes found all of which were negative. She had no evidence for metastatic disease on CT scan of the abdomen and pelvis either. Her date of surgery was 10/22/2011.  Not in need of chemotherapy for her stage I cancer.   . Iron deficiency anemia 02/01/2012    At time of colon cancer presentation  . Knee joint pain 2014    right   Past Surgical History  Procedure Laterality Date  . Abdominal hysterectomy      partial  . Cataract extraction    .  Cesarean section    . Dental implant    . Appendectomy      [facility]  . Partial colectomy  10/22/2011    Procedure: PARTIAL COLECTOMY;  Surgeon: Dalia Heading, MD;  Location: [facility];  Service: General;  Laterality: N/A;  . Colon surgery     Social History:  reports that she has never smoked. She has never used smokeless tobacco. She reports that she does not drink alcohol or use illicit drugs. Where does patient live home. Can patient participate in ADLs? Yes.  Allergies  Allergen Reactions  . Aspirin     REACTION: Upset stomach if takes regular and uncoated     Family History:  Family History  Problem Relation Age of Onset  . Breast cancer Daughter   . Hypertension Daughter   . Colon cancer Neg Hx   . Liver disease Neg Hx   . Hypertension Mother   . Cancer Sister       Prior to Admission medications   Medication Sig Start Date End Date Taking? Authorizing Provider  aluminum & magnesium hydroxide-simethicone (MYLANTA) 500-450-40 MG/5ML suspension Take by mouth every 6 (six) hours as needed for indigestion.   Yes Historical Provider, MD  HYDROcodone-acetaminophen (NORCO/VICODIN) 5-325 MG per tablet Take 1 tablet by mouth every 6 (six) hours as needed for pain. 10/26/11  Yes Dalia Heading, MD  losartan (COZAAR) 100 MG tablet Take 100 mg by mouth every morning.    Yes  Historical Provider, MD  omeprazole (PRILOSEC) 20 MG capsule Take 20 mg by mouth 2 (two) times daily.    Yes Historical Provider, MD  POTASSIUM PO Take 1 tablet by mouth daily.   Yes Historical Provider, MD  pravastatin (PRAVACHOL) 40 MG tablet Take 40 mg by mouth every morning.    Yes Historical Provider, MD    Physical Exam: Filed Vitals:   10/22/12 1930 10/22/12 2000 10/22/12 2100 10/22/12 2130  BP: 139/71 128/65 149/74 162/52  Pulse: 96 87 69 64  Temp:      TempSrc:      Resp: 22 16    SpO2: 97% 99% 98% 100%     General:  Well-developed and nourished.  Eyes: Anicteric no pallor.  ENT: No discharge  from ears nose mouth.  Neck: No mass felt.  Cardiovascular: S1-S2 heard.  Respiratory: No rhonchi or crepitations.  Abdomen: Soft nontender bowel sounds present.  Skin: No rash.  Musculoskeletal: No edema.  Psychiatric: Appears normal.  Neurologic: Alert awake oriented to time place and person. Moves all extremities.  Labs on Admission:  Basic Metabolic Panel:  Recent Labs Lab 10/18/12 0511 10/22/12 1928  NA 140 138  K 3.5 3.7  CL 102 103  CO2 30 24  GLUCOSE 109* 146*  BUN 13 12  CREATININE 0.73 0.76  CALCIUM 9.3 9.6   Liver Function Tests: No results found for this basename: AST, ALT, ALKPHOS, BILITOT, PROT, ALBUMIN,  in the last 168 hours  Recent Labs Lab 10/18/12 0511  LIPASE 48   No results found for this basename: AMMONIA,  in the last 168 hours CBC:  Recent Labs Lab 10/18/12 0511 10/22/12 1928  WBC 5.4 6.4  NEUTROABS 2.5  --   HGB 12.4 13.0  HCT 37.8 37.9  MCV 88.1 86.3  PLT 270 299   Cardiac Enzymes:  Recent Labs Lab 10/18/12 0511 10/22/12 1928  TROPONINI <0.30 <0.30    BNP (last 3 results) No results found for this basename: PROBNP,  in the last 8760 hours CBG: No results found for this basename: GLUCAP,  in the last 168 hours  Radiological Exams on Admission: Dg Chest 2 View  10/22/2012   CLINICAL DATA:  Chest pain  EXAM: CHEST  2 VIEW  COMPARISON:  10/18/2012  FINDINGS: Heart size upper normal. Negative for heart failure. Lungs are free of infiltrate effusion or mass.  IMPRESSION: No active cardiopulmonary disease.   Electronically Signed   By: Marlan Palau M.D.   On: 10/22/2012 20:32    EKG: Independently reviewed. Normal sinus rhythm with LVH.  Assessment/Plan Principal Problem:   Chest pain Active Problems:   HYPERLIPIDEMIA   HYPERTENSION, BENIGN ESSENTIAL   ARTHRITIS   Colon cancer   1. Chest pain - Presently chest pain-free. Cycle cardiac markers. Since patient has history of colon cancer check d-dimer. Patient  is allergic to aspirin. If patient's chest pain recurs may consider Plavix. 2. Hypertension - continue home medications. 3. Hyperlipidemia - continue present medications. 4. History of colon cancer status post hemicolectomy.    Code Status: Full code.  Family Communication: Patient's sister at the bedside.  Disposition Plan: Admit for observation.    Ashlie Mcmenamy N. [facility] Pager [phone].  If 7PM-7AM, please contact night-coverage [email or web address] Password [email or web address] 10/22/2012, 9:55 PM

## 2012-10-22 NOTE — ED Provider Notes (Signed)
CSN: [id number]     Arrival date & time 10/22/12  1900 History   First MD Initiated Contact with Patient 10/22/12 1916     Chief Complaint  Patient presents with  . Chest Pain   (Consider location/radiation/quality/duration/timing/severity/associated sxs/prior Treatment) HPI Comments: Patient presents with chest pain. She describes it as an achy feeling to the left side of her chest it radiates around to her left arm. She does have some associated shortness of breath but no nausea vomiting or lightheadedness. She denies any diaphoresis. She denies a history of heart problems in the past. She states she has not had a stress test or catheterization. She has a history of hypertension and hyperlipidemia. She was seen in [facility] 4 days ago for similar chest pain. Her symptoms seemed to improve after GI medications. It is felt to be related to reflux. She has not followed up with a cardiologist or primary care physician. She states it today his chest pain was worse.  Patient is a 79 y.o. female presenting with chest pain.  Chest Pain Associated symptoms: shortness of breath   Associated symptoms: no abdominal pain, no back pain, no cough, no diaphoresis, no dizziness, no fatigue, no fever, no headache, no nausea, no numbness, not vomiting and no weakness     Past Medical History  Diagnosis Date  . Osteoporosis   . Arthritis   . HTN (hypertension)   . Hyperlipidemia   . Cancer     cecal carcinoma  . GERD (gastroesophageal reflux disease)   . Colon cancer 02/01/2012    Stage I (T2, N0, M0) cancer the cecum status post resection by Dr. Lovell Sheehan with 14 lymph nodes found all of which were negative. She had no evidence for metastatic disease on CT scan of the abdomen and pelvis either. Her date of surgery was 10/22/2011.  Not in need of chemotherapy for her stage I cancer.   . Iron deficiency anemia 02/01/2012    At time of colon cancer presentation  . Knee joint pain 2014    right    Past Surgical History  Procedure Laterality Date  . Abdominal hysterectomy      partial  . Cataract extraction    . Cesarean section    . Dental implant    . Appendectomy      [facility]  . Partial colectomy  10/22/2011    Procedure: PARTIAL COLECTOMY;  Surgeon: Dalia Heading, MD;  Location: [facility];  Service: General;  Laterality: N/A;  . Colon surgery     Family History  Problem Relation Age of Onset  . Breast cancer Daughter   . Hypertension Daughter   . Colon cancer Neg Hx   . Liver disease Neg Hx   . Hypertension Mother   . Cancer Sister    History  Substance Use Topics  . Smoking status: Never Smoker   . Smokeless tobacco: Never Used  . Alcohol Use: No   OB History   Grav Para Term Preterm Abortions TAB SAB Ect Mult Living                 Review of Systems  Constitutional: Negative for fever, chills, diaphoresis and fatigue.  HENT: Negative for congestion, rhinorrhea and sneezing.   Eyes: Negative.   Respiratory: Positive for shortness of breath. Negative for cough and chest tightness.   Cardiovascular: Positive for chest pain. Negative for leg swelling.  Gastrointestinal: Negative for nausea, vomiting, abdominal pain, diarrhea and blood in stool.  Genitourinary: Negative for frequency, hematuria, flank pain and difficulty urinating.  Musculoskeletal: Negative for arthralgias and back pain.  Skin: Negative for rash.  Neurological: Negative for dizziness, speech difficulty, weakness, numbness and headaches.    Allergies  Aspirin  Home Medications  No current outpatient prescriptions on file. BP 170/64  Pulse 70  Temp(Src) 98 F (36.7 C) (Oral)  Resp 16  Ht 5\' 4"  (1.626 m)  Wt 140 lb 12.8 oz (63.866 kg)  BMI 24.16 kg/m2  SpO2 100% Physical Exam  Constitutional: She is oriented to person, place, and time. She appears well-developed and well-nourished.  HENT:  Head: Normocephalic and atraumatic.  Eyes: Pupils are equal, round, and reactive to light.   Neck: Normal range of motion. Neck supple.  Cardiovascular: Normal rate, regular rhythm and normal heart sounds.   Pulmonary/Chest: Effort normal and breath sounds normal. No respiratory distress. She has no wheezes. She has no rales. She exhibits no tenderness.  Abdominal: Soft. Bowel sounds are normal. There is no tenderness. There is no rebound and no guarding.  Musculoskeletal: Normal range of motion. She exhibits no edema.  Lymphadenopathy:    She has no cervical adenopathy.  Neurological: She is alert and oriented to person, place, and time.  Skin: Skin is warm and dry. No rash noted.  Psychiatric: She has a normal mood and affect.    ED Course  Procedures (including critical care time) Labs Review Results for orders placed during the hospital encounter of 10/22/12  BASIC METABOLIC PANEL      Result Value Range   Sodium 138  135 - 145 mEq/L   Potassium 3.7  3.5 - 5.1 mEq/L   Chloride 103  96 - 112 mEq/L   CO2 24  19 - 32 mEq/L   Glucose, Bld 146 (*) 70 - 99 mg/dL   BUN 12  6 - 23 mg/dL   Creatinine, Ser 4.09  0.50 - 1.10 mg/dL   Calcium 9.6  8.4 - 81.1 mg/dL   GFR calc non Af Amer 75 (*) >90 mL/min   GFR calc Af Amer 87 (*) >90 mL/min  CBC      Result Value Range   WBC 6.4  4.0 - 10.5 K/uL   RBC 4.39  3.87 - 5.11 MIL/uL   Hemoglobin 13.0  12.0 - 15.0 g/dL   HCT 91.4  78.2 - 95.6 %   MCV 86.3  78.0 - 100.0 fL   MCH 29.6  26.0 - 34.0 pg   MCHC 34.3  30.0 - 36.0 g/dL   RDW 21.3  08.6 - 57.8 %   Platelets 299  150 - 400 K/uL  TROPONIN I      Result Value Range   Troponin I <0.30  <0.30 ng/mL   Dg Chest 2 View  10/22/2012   CLINICAL DATA:  Chest pain  EXAM: CHEST  2 VIEW  COMPARISON:  10/18/2012  FINDINGS: Heart size upper normal. Negative for heart failure. Lungs are free of infiltrate effusion or mass.  IMPRESSION: No active cardiopulmonary disease.   Electronically Signed   By: Marlan Palau M.D.   On: 10/22/2012 20:32   Dg Chest Portable 1 View  10/18/2012    CLINICAL DATA:  Chest pain  EXAM: PORTABLE CHEST - 1 VIEW  COMPARISON:  09/08/2012  FINDINGS: Cardiomegaly. Unchanged mediastinal contours. Chronic mild coarsening of interstitial markings without edema or infiltrate. No effusion or pneumothorax.  IMPRESSION: Cardiomegaly without failure or other acute finding.   Electronically Signed  By: Tiburcio Pea M.D.   On: 10/18/2012 05:00     Imaging Review Dg Chest 2 View  10/22/2012   CLINICAL DATA:  Chest pain  EXAM: CHEST  2 VIEW  COMPARISON:  10/18/2012  FINDINGS: Heart size upper normal. Negative for heart failure. Lungs are free of infiltrate effusion or mass.  IMPRESSION: No active cardiopulmonary disease.   Electronically Signed   By: Marlan Palau M.D.   On: 10/22/2012 20:32    EKG Interpretation     Ventricular Rate:  86 PR Interval:  172 QRS Duration: 77 QT Interval:  353 QTC Calculation: 423 R Axis:   16 Text Interpretation:  Sinus rhythm Left atrial enlargement Abnormal R-wave progression, early transition LVH with secondary repolarization abnormality since last tracing no significant change            MDM   1. Chest pain   2. Essential hypertension, benign    Patient chest pain. Her symptoms were resolved with a GI cocktail. She did however have some associated shortness of breath. I feel that this is likely reflux however given this is her second ER visit in 4 for chest pain and she's not had any other cardiac evaluation I did feel that it would be best to admit her for observation serial enzymes and possible further cardiac evaluation. She was not given aspirin due to her allergy. I spoke with the hospitalist who will patient.    Rolan Bucco, MD 10/22/12 [phone]

## 2012-10-23 ENCOUNTER — Encounter (HOSPITAL_COMMUNITY): Payer: Self-pay | Admitting: *Deleted

## 2012-10-23 DIAGNOSIS — C189 Malignant neoplasm of colon, unspecified: Secondary | ICD-10-CM

## 2012-10-23 DIAGNOSIS — R0602 Shortness of breath: Secondary | ICD-10-CM | POA: Diagnosis not present

## 2012-10-23 DIAGNOSIS — I10 Essential (primary) hypertension: Secondary | ICD-10-CM | POA: Diagnosis not present

## 2012-10-23 DIAGNOSIS — R079 Chest pain, unspecified: Secondary | ICD-10-CM | POA: Diagnosis not present

## 2012-10-23 DIAGNOSIS — M129 Arthropathy, unspecified: Secondary | ICD-10-CM | POA: Diagnosis not present

## 2012-10-23 DIAGNOSIS — I359 Nonrheumatic aortic valve disorder, unspecified: Secondary | ICD-10-CM

## 2012-10-23 DIAGNOSIS — K219 Gastro-esophageal reflux disease without esophagitis: Secondary | ICD-10-CM | POA: Diagnosis present

## 2012-10-23 LAB — CREATININE, SERUM
Creatinine, Ser: 0.71 mg/dL (ref 0.50–1.10)
GFR calc Af Amer: 89 mL/min — ABNORMAL LOW (ref >90)
GFR calc non Af Amer: 76 mL/min — ABNORMAL LOW (ref >90)

## 2012-10-23 LAB — TROPONIN I
Troponin I: <0.3 ng/mL (ref <0.30)
Troponin I: <0.3 ng/mL (ref <0.30)

## 2012-10-23 LAB — CBC
Hemoglobin: 13.4 g/dL (ref 12.0–15.0)
Platelets: 286 10*3/uL (ref 150–400)
RBC: 4.53 MIL/uL (ref 3.87–5.11)

## 2012-10-23 LAB — D-DIMER, QUANTITATIVE: D-Dimer, Quant: 0.39 ug/mL-FEU (ref 0.00–0.48)

## 2012-10-23 MED ORDER — NITROGLYCERIN 0.4 MG SL SUBL
SUBLINGUAL_TABLET | SUBLINGUAL | Status: AC
  Administered 2012-10-23: 04:00:00
  Filled 2012-10-23: qty 25

## 2012-10-23 MED ORDER — ACETAMINOPHEN 325 MG PO TABS: 650.0000 mg | ORAL_TABLET | ORAL | Status: DC | PRN

## 2012-10-23 MED ORDER — HYDROCODONE-ACETAMINOPHEN 5-325 MG PO TABS: 1.0000 | ORAL_TABLET | Freq: Four times a day (QID) | ORAL | Status: DC | PRN

## 2012-10-23 NOTE — Progress Notes (Signed)
D/c orders received;IV removed with gauze on, pt remains in stable condition, pt meds and instructions reviewed and given to pt; pt d/c to home 

## 2012-10-23 NOTE — Progress Notes (Signed)
Pt c/o left chest pain, worsening with movement 9/10.  VSS, vicodin and maalox given with little relief.  EKG obtained with no acute changes noted.  2 SL ntg given and pt fell asleep while rechecking blood pressure.  No pain at present.  Lenny Pastel, NP with [facility] notified.

## 2012-10-23 NOTE — Progress Notes (Signed)
[facility] progress note. Chief complaint. Chest pain. History of present illness. This 79 year old female admitted overnight complaints of chest pain. Pain in the left anterior chest wall. Patient was admitted to rule out cardiac ischemia. She was chest pain-free by the time she was seen in the emergency room admitting physician. Patient complained of left lateral chest pain to nursing and a 12-lead EKG was obtained. I reviewed this and find no significant change from prior EKG obtained in the emergency room. Nursing to administer 2 sublingual nitroglycerin, Maalox, and Vicodin/Norco and subsequently fell asleep. I was notified and came to see the patient at bedside. By the time I saw her she was again chest pain-free. She describes a burning pain in the lateral chest worse with movement. There is no current radiation, diaphoresis, or nausea. Vital signs. Temperature 98.1, pulse 80, respiration 16, blood pressure 125/67. O2 sats 100%. General appearance. Well-developed elderly female who is alert, cooperative, and in no distress. Cardiac. Rate and rhythm regular. Lungs. Breath sounds clear and equal. Abdomen. Soft with positive bowel sounds. Musculoskeletal. No pain with palpation over the left lateral chest and left breast. Impression/plan. Problem #1. Chest pain. A current EKG not suggestive of ischemia and looks unchanged compared to prior EKG obtained in the emergency room. 2 of 3 troponin have been cycled and these have been normal less than 0.3. Suspect musculoskeletal etiology or GI etiology most likely explanation. We'll continue to follow for the next troponin. Patient currently stable and chest pain-free.

## 2012-10-23 NOTE — Discharge Summary (Signed)
Physician Discharge Summary  Barbara Ahart ZOX:[id number] DOB: 1925/11/22 DOA: 10/22/2012  PCP: Avon Gully, MD  Admit date: 10/22/2012 Discharge date: 10/23/2012  Discharge Diagnoses:  Principal Problem:   Chest pain, likely musculoskeletal Active Problems:   HYPERLIPIDEMIA   HYPERTENSION, BENIGN ESSENTIAL   ARTHRITIS   Colon cancer   Discharge Condition: stable  Filed Weights   10/22/12 2308  Weight: 63.866 kg (140 lb 12.8 oz)    History of present illness:  79 y.o. female with known history of hypertension and hyperlipidemia and colon cancer status post right hemicolectomy presented to the ER because of chest pain. Patient has been having chest pain off and on for last one week. Pain is mostly in the mornings when she wakes up. Pain in the left anterior chest wall, dull aching sometimes radiating to the back. The chest pain gets better with PPI. Last Wednesday, 4 days ago she had gone to [facility] and had preliminary test done and discharged home. Since patient has had recurrent symptoms patient presented to the ER at [facility]. Cardiac markers and chest x-ray were unremarkable. Patient has been admitted for further management. Patient presently chest pain-free. Denies any exertional symptoms or shortness of breath cough or any fever chills nausea vomiting abdominal pain or diarrhea.   Hospital Course:  Observed on telemetry. MI ruled out. D dimer normal.  Pain with a reproducible component to palpation.  Likely musculoskeletal. Echo showed no wall motion abnormalities. Discharged with pain medication.  Procedures:  none  Consultations:  none  Discharge Exam: Filed Vitals:   10/23/12 0954  BP: 123/60  Pulse:   Temp:   Resp:     General: comfortable Cardiovascular: RRR.  Chest wall tender to palpation Respiratory: CTA  Discharge Instructions  Discharge Orders   Future Appointments Provider Department Dept Phone   01/08/2013 9:50 AM [facility]  [facility] [phone]   01/12/2013 11:30 AM Ellouise Newer, PA-C [facility] [phone]   Future Orders Complete By Expires   Activity as tolerated - No restrictions  As directed    Diet - low sodium heart healthy  As directed        Medication List         acetaminophen 325 MG tablet  Commonly known as:  TYLENOL  Take 2 tablets (650 mg total) by mouth every 4 (four) hours as needed.     aluminum & magnesium hydroxide-simethicone 500-450-40 MG/5ML suspension  Commonly known as:  MYLANTA  Take by mouth every 6 (six) hours as needed for indigestion.     HYDROcodone-acetaminophen 5-325 MG per tablet  Commonly known as:  NORCO/VICODIN  Take 1-2 tablets by mouth every 6 (six) hours as needed (severe pain).     losartan 100 MG tablet  Commonly known as:  COZAAR  Take 100 mg by mouth every morning.     omeprazole 20 MG capsule  Commonly known as:  PRILOSEC  Take 20 mg by mouth 2 (two) times daily.     POTASSIUM PO  Take 1 tablet by mouth daily.     pravastatin 40 MG tablet  Commonly known as:  PRAVACHOL  Take 40 mg by mouth every morning.       Allergies  Allergen Reactions  . Aspirin     REACTION: Upset stomach if takes regular and uncoated       The results of significant diagnostics from this hospitalization (including imaging, microbiology, ancillary and laboratory) are listed below for reference.  Significant Diagnostic Studies: Dg Chest 2 View  10/22/2012   CLINICAL DATA:  Chest pain  EXAM: CHEST  2 VIEW  COMPARISON:  10/18/2012  FINDINGS: Heart size upper normal. Negative for heart failure. Lungs are free of infiltrate effusion or mass.  IMPRESSION: No active cardiopulmonary disease.   Electronically Signed   By: Marlan Palau M.D.   On: 10/22/2012 20:32   Dg Chest Portable 1 View  10/18/2012   CLINICAL DATA:  Chest pain  EXAM: PORTABLE CHEST - 1 VIEW  COMPARISON:  09/08/2012  FINDINGS: Cardiomegaly. Unchanged mediastinal  contours. Chronic mild coarsening of interstitial markings without edema or infiltrate. No effusion or pneumothorax.  IMPRESSION: Cardiomegaly without failure or other acute finding.   Electronically Signed   By: Tiburcio Pea M.D.   On: 10/18/2012 05:00   Echo Left ventricle: The cavity size was normal. Systolic function was normal. The estimated ejection fraction was in the range of 60% to 65%. Wall motion was normal; there were no regional wall motion abnormalities. Doppler parameters are consistent with abnormal left ventricular relaxation (grade 1 diastolic dysfunction). - Aortic valve: Mild regurgitation.  EKG Normal sinus rhythm Left ventricular hypertrophy  Microbiology: No results found for this or any previous visit (from the past 240 hour(s)).   Labs: Basic Metabolic Panel:  Recent Labs Lab 10/18/12 0511 10/22/12 1928 10/23/12 0002  NA 140 138  --   K 3.5 3.7  --   CL 102 103  --   CO2 30 24  --   GLUCOSE 109* 146*  --   BUN 13 12  --   CREATININE 0.73 0.76 0.71  CALCIUM 9.3 9.6  --    Liver Function Tests: No results found for this basename: AST, ALT, ALKPHOS, BILITOT, PROT, ALBUMIN,  in the last 168 hours  Recent Labs Lab 10/18/12 0511  LIPASE 48   No results found for this basename: AMMONIA,  in the last 168 hours CBC:  Recent Labs Lab 10/18/12 0511 10/22/12 1928 10/23/12 0002  WBC 5.4 6.4 6.7  NEUTROABS 2.5  --   --   HGB 12.4 13.0 13.4  HCT 37.8 37.9 39.5  MCV 88.1 86.3 87.2  PLT 270 299 286   Cardiac Enzymes:  Recent Labs Lab 10/18/12 0511 10/22/12 1928 10/23/12 0002 10/23/12 0445 10/23/12 1031  TROPONINI <0.30 <0.30 <0.30 <0.30 <0.30   BNP: BNP (last 3 results) No results found for this basename: PROBNP,  in the last 8760 hours CBG: No results found for this basename: GLUCAP,  in the last 168 hours  Signed:  Luz Burcher L  [facility] 10/23/2012, 1:58 PM

## 2012-10-23 NOTE — Progress Notes (Signed)
UR Completed Immanuel Fedak Graves-Bigelow, RN,BSN [phone]  

## 2012-10-23 NOTE — Progress Notes (Signed)
*  PRELIMINARY RESULTS* Echocardiogram 2D Echocardiogram has been performed.  Sandra Williams 10/23/2012, 12:24 PM

## 2012-11-03 ENCOUNTER — Encounter: Payer: Self-pay | Admitting: *Deleted

## 2012-11-03 ENCOUNTER — Encounter: Payer: Self-pay | Admitting: Cardiovascular Disease

## 2012-11-03 ENCOUNTER — Ambulatory Visit (INDEPENDENT_AMBULATORY_CARE_PROVIDER_SITE_OTHER): Payer: Medicare Other | Admitting: Cardiovascular Disease

## 2012-11-03 VITALS — BP 119/68 | HR 77 | Ht 64.0 in | Wt 142.0 lb

## 2012-11-03 DIAGNOSIS — E785 Hyperlipidemia, unspecified: Secondary | ICD-10-CM | POA: Diagnosis not present

## 2012-11-03 DIAGNOSIS — R079 Chest pain, unspecified: Secondary | ICD-10-CM

## 2012-11-03 DIAGNOSIS — I10 Essential (primary) hypertension: Secondary | ICD-10-CM | POA: Diagnosis not present

## 2012-11-03 MED ORDER — ISOSORBIDE MONONITRATE ER 30 MG PO TB24: 30.0000 mg | ORAL_TABLET | Freq: Every day | ORAL | Status: DC

## 2012-11-03 NOTE — Patient Instructions (Addendum)
Your physician recommends that you schedule a follow-up appointment in: 4-6 weeks  Your physician has requested that you have a lexiscan myoview. For further information please visit [email or web address] Please follow instruction sheet, as given.  Your physician has recommended you make the following change in your medication: Imdur 30 mg daily  Lexiscan Stress Electrocardiography Your caregiver has ordered a Lexiscan Stress Test with nuclear imaging. The purpose of this test is to evaluate the blood supply to your heart muscle. This procedure is referred to as a "Non-Invasive Stress Test." This is because other than having an IV started in your vein, nothing is inserted or "invades" your body. Cardiac stress tests are done to find areas of poor blood flow to the heart by determining the extent of coronary artery disease (CAD). Some patients exercise on a treadmill, which naturally increases the blood flow to your heart. However, almost half of the patients undergoing a treadmill stress test each year are unable to exercise adequately. This is due to various medical conditions. For these patients, a pharmacologic/chemical stress agent called Eugenie Birks is used on patients without a history of asthma. This medicine will mimic walking on a treadmill by temporarily increasing your coronary blood flow. The side effects of this medicine include:  Headache.  Dizziness.  Shortness of breath.  Flushing.  Chest pain/pressure.  Feeling sick to your stomach (nauseous).  Increased heart rate.  Abdominal discomfort.  Low blood pressure. BEFORE THE PROCEDURE   Avoid all forms of caffeine 24 hours before your test. This includes coffee, tea (even decaffeinated brands), caffeinated sodas, chocolate, cocoa and certain pain medications that contain caffeine.  Do not eat anything 6 hours before the test. In hospitalized patients, this means nothing by mouth after midnight.  Patients with diabetes that are  not hospitalized (outpatients) should talk to their caregiver about how much, if any, insulin or oral diabetic medications they should take the day of the test. You should also talk about the type of light snack that you should eat the morning of the test.  Patients with diabetes that are hospitalized (inpatients) will follow the cardiologist's written instructions that will be given to you.  Outpatients should bring a snack. Use the hospital vending machines so that you may eat right after the stress phase.  Wear comfortable shoes. There is at least an hour wait before the stress phase of nuclear scanning is done. The total procedure time is approximately 3 hours.  The following medications should be stopped 24 hours before your stress test: Beta-blockers and nitroglycerine (patches or paste should be removed 4 hours before the test).  Women, tell your caregiver if there is any possibility that you may be pregnant or if you are currently breastfeeding. PROCEDURE  There are two separate nuclear images taken using a nuclear camera. These images require a small dose of a radiotracer called an isotope. Most diagnostic nuclear medicine procedures result in low radiation exposure. Allergic reactions to the isotope may include slight redness going up the arm and pain during the injection. However, these reactions are extremely rare and usually mild. Eugenie Birks is given very rapidly over 10-15 seconds in the vein (intravenously) followed by a nuclear isotope. The medication causes the arteries in your heart to widen (dilate), and the nuclear isotope lights up those arteries so that the nuclear images are clear. Together, this shows whether the coronary blood flow is normal or abnormal. During this stress phase, you will be connected to an EKG machine. Your blood pressure  and oxygen levels will be monitored by the cardiologist and cardiac nurse. This part usually lasts 5-10 minutes. For inpatients, the procedure  is done in the patient's room. For outpatients, the procedure is done in the stress lab.  The "Resting Phase" is done before the Lexiscan injection and shows how your heart functions at rest. The "Stress Phase" is done about 1 hour after the Lexiscan injection and determines how your heart functions under stress.  LEXISCAN STRESS TEST IS USEFUL FOR DETERMINING:  The adequacy of blood flow to your heart during increased levels of activity in order to clear you for discharge home.  The extent of coronary artery blockage caused by CAD.  Your prognosis if you have suffered a heart attack.  The effectiveness of cardiac procedures done, such as an angioplasty, which can increase the circulation in your coronary arteries.  Cause(s) of chest pain/pressure. Finding out the results of your test  Ask when your test results will be ready. Make sure you get your test results. Document Released: 05/09/2008 Document Revised: 03/15/2011 Document Reviewed: 05/09/2008 [facility] Patient Information 2014 [place], [place].

## 2012-11-03 NOTE — Addendum Note (Signed)
Addended by: Marlyn Corporal A on: 11/03/2012 10:32 AM   Modules accepted: Orders

## 2012-11-03 NOTE — Progress Notes (Signed)
Patient ID: Sandra Williams, female   DOB: 07-Nov-1925, 79 y.o.   MRN: [id number]       CARDIOLOGY CONSULT NOTE  Patient ID: Sandra Williams MRN: [id number] DOB/AGE: 04-30-1925 79 y.o.  Admit date: (Not on file) Primary Physician Sandra Gully, MD  Reason for Consultation: chest pain  HPI: Sandra Williams is an 79 yr old woman with a h/o HTN and hyperlipidemia, along with colon cancer s/p right hemicolectomy. She was recently evaluated at both [facility] and [facility] for chest pain. It had been intermittent for about a week, occurring mostly in the mornings upon awakening. It is located in the left inframammary region and left axilla, and is described as "dull and aching" with occasional radiation to the back. It is relieved with a PPI and more recently, with hydrocodone. It is also relieved with belching. Troponins have been normal, as was a D-dimer, and the pain was apparently reproducible with palpation, and deemed to be musculoskeletal in etiology.  An echocardiogram on 10-23-12 showed normal LV systolic function, EF 60-65%, grade I diastolic dysfunction, and mild aortic regurgitation. ECG on 10/22/12 showed NSR, 86 bpm, LVH with repolarization abnormalities.  She has not had any chest pain recently.    Allergies  Allergen Reactions  . Aspirin     REACTION: Upset stomach if takes regular and uncoated     Current Outpatient Prescriptions  Medication Sig Dispense Refill  . acetaminophen (TYLENOL) 325 MG tablet Take 2 tablets (650 mg total) by mouth every 4 (four) hours as needed.      Marland Kitchen aluminum & magnesium hydroxide-simethicone (MYLANTA) 500-450-40 MG/5ML suspension Take by mouth every 6 (six) hours as needed for indigestion.      Marland Kitchen HYDROcodone-acetaminophen (NORCO/VICODIN) 5-325 MG per tablet Take 1-2 tablets by mouth every 6 (six) hours as needed (severe pain).  20 tablet  0  . losartan (COZAAR) 100 MG tablet Take 100 mg by mouth every morning.       Marland Kitchen omeprazole (PRILOSEC) 20  MG capsule Take 20 mg by mouth 2 (two) times daily.       Marland Kitchen POTASSIUM PO Take 1 tablet by mouth daily.      . pravastatin (PRAVACHOL) 40 MG tablet Take 40 mg by mouth every morning.        No current facility-administered medications for this visit.    Past Medical History  Diagnosis Date  . Osteoporosis   . Arthritis   . HTN (hypertension)   . Hyperlipidemia   . Cancer     cecal carcinoma  . GERD (gastroesophageal reflux disease)   . Colon cancer 02/01/2012    Stage I (T2, N0, M0) cancer the cecum status post resection by Sandra Williams with 14 lymph nodes found all of which were negative. She had no evidence for metastatic disease on CT scan of the abdomen and pelvis either. Her date of surgery was 10/22/2011.  Not in need of chemotherapy for her stage I cancer.   . Iron deficiency anemia 02/01/2012    At time of colon cancer presentation  . Knee joint pain 2014    right    Past Surgical History  Procedure Laterality Date  . Abdominal hysterectomy      partial  . Cataract extraction    . Cesarean section    . Dental implant    . Appendectomy      [facility]  . Partial colectomy  10/22/2011    Procedure: PARTIAL COLECTOMY;  Surgeon: Sandra Heading, MD;  Location: [facility]  ORS;  Service: General;  Laterality: N/A;  . Colon surgery      History   Social History  . Marital Status: Single    Spouse Name: N/A    Number of Children: N/A  . Years of Education: N/A   Occupational History  . Not on file.   Social History Main Topics  . Smoking status: Never Smoker   . Smokeless tobacco: Never Used  . Alcohol Use: No  . Drug Use: No  . Sexual Activity: No   Other Topics Concern  . Not on file   Social History Narrative  . No narrative on file     Family History  Problem Relation Age of Onset  . Breast cancer Daughter   . Hypertension Daughter   . Colon cancer Neg Hx   . Liver disease Neg Hx   . Hypertension Mother   . Cancer Sister      Prior to Admission medications     Medication Sig Start Date End Date Taking? Authorizing Provider  acetaminophen (TYLENOL) 325 MG tablet Take 2 tablets (650 mg total) by mouth every 4 (four) hours as needed. 10/23/12   Sandra Ha, MD  aluminum & magnesium hydroxide-simethicone (MYLANTA) 500-450-40 MG/5ML suspension Take by mouth every 6 (six) hours as needed for indigestion.    Historical Provider, MD  HYDROcodone-acetaminophen (NORCO/VICODIN) 5-325 MG per tablet Take 1-2 tablets by mouth every 6 (six) hours as needed (severe pain). 10/23/12   Sandra Ha, MD  losartan (COZAAR) 100 MG tablet Take 100 mg by mouth every morning.     Historical Provider, MD  omeprazole (PRILOSEC) 20 MG capsule Take 20 mg by mouth 2 (two) times daily.     Historical Provider, MD  POTASSIUM PO Take 1 tablet by mouth daily.    Historical Provider, MD  pravastatin (PRAVACHOL) 40 MG tablet Take 40 mg by mouth every morning.     Historical Provider, MD     Review of systems complete and found to be negative unless listed above in HPI     Physical exam  BP: 119/68 Pulse: 77  General: NAD Neck: No JVD, no thyromegaly or thyroid nodule.  Lungs: Clear to auscultation bilaterally with normal respiratory effort. CV: Nondisplaced PMI.  Heart regular S1/S2, no S3/S4, no murmur.  No peripheral edema.  No carotid bruit.  Normal pedal pulses.  Abdomen: Soft, nontender, no hepatosplenomegaly, no distention.  Skin: Intact without lesions or rashes.  Neurologic: Alert and oriented x 3.  Psych: Normal affect. Extremities: No clubbing or cyanosis.  HEENT: Normal.   Labs:   Lab Results  Component Value Date   WBC 6.7 10/23/2012   HGB 13.4 10/23/2012   HCT 39.5 10/23/2012   MCV 87.2 10/23/2012   PLT 286 10/23/2012   No results found for this basename: NA, K, CL, CO2, BUN, CREATININE, CALCIUM, LABALBU, PROT, BILITOT, ALKPHOS, ALT, AST, GLUCOSE,  in the last 168 hours Lab Results  Component Value Date   TROPONINI <0.30 10/23/2012     Lab Results  Component Value Date   CHOL 239* 08/14/2008   CHOL 263* 01/19/2008   CHOL 215* 03/02/2007   Lab Results  Component Value Date   HDL 74 08/14/2008   HDL 73 1/61/0960   HDL 77 4/54/0981   Lab Results  Component Value Date   LDLCALC 142* 08/14/2008   LDLCALC 166* 01/19/2008   LDLCALC 119* 03/02/2007   Lab Results  Component Value Date   TRIG 113 08/14/2008  TRIG 119 01/19/2008   TRIG 96 03/02/2007   Lab Results  Component Value Date   CHOLHDL 3.2 Ratio 08/14/2008   CHOLHDL 3.6 Ratio 01/19/2008   CHOLHDL 2.8 Ratio 03/02/2007   No results found for this basename: LDLDIRECT       EKG: See HPI  Studies: See HPI  ASSESSMENT AND PLAN:  1. Chest pain: her primary risk factors for CAD appear to be HTN, hyperlipidemia, and age. Her symptoms are somewhat atypical as they mostly occur upon awakening and are relieved with both belching and hydrocodone. However, she also said it subsided with SL nitro given in the ED. Her normal LV systolic function without resting wall motion abnormalities is reassuring. Given the potential side effects of hydrocodone (fatigue, constipation), I will prescribe Imdur 30 mg daily. I will also proceed with a Lexiscan Cardiolite stress test to evaluate for inducible ischemia. 2. HTN: currently controlled on losartan. 3. Hyperlipidemia: on pravastatin.  Dispo: f/u 4-6 weeks.  Signed: Prentice Docker, M.D., F.A.C.C.  11/03/2012, 9:35 AM

## 2012-11-08 ENCOUNTER — Other Ambulatory Visit: Payer: Self-pay | Admitting: *Deleted

## 2012-11-08 DIAGNOSIS — R079 Chest pain, unspecified: Secondary | ICD-10-CM

## 2012-11-15 ENCOUNTER — Inpatient Hospital Stay (HOSPITAL_COMMUNITY): Admission: RE | Admit: 2012-11-15 | Payer: Medicare Other | Source: Ambulatory Visit

## 2012-11-15 ENCOUNTER — Encounter (HOSPITAL_COMMUNITY): Admission: RE | Admit: 2012-11-15 | Payer: Medicare Other | Source: Ambulatory Visit

## 2012-11-15 ENCOUNTER — Other Ambulatory Visit: Payer: Self-pay | Admitting: Cardiovascular Disease

## 2012-11-15 ENCOUNTER — Encounter (HOSPITAL_COMMUNITY)
Admission: RE | Admit: 2012-11-15 | Discharge: 2012-11-15 | Disposition: A | Discharge status: Home / Self Care | Payer: Medicare Other | Source: Ambulatory Visit | Attending: Cardiovascular Disease | Admitting: Cardiovascular Disease

## 2012-11-15 DIAGNOSIS — R079 Chest pain, unspecified: Secondary | ICD-10-CM

## 2012-11-23 ENCOUNTER — Encounter (HOSPITAL_COMMUNITY)
Admission: RE | Admit: 2012-11-23 | Discharge: 2012-11-23 | Disposition: A | Discharge status: Home / Self Care | Payer: Medicare Other | Source: Ambulatory Visit | Attending: Cardiovascular Disease | Admitting: Cardiovascular Disease

## 2012-11-23 ENCOUNTER — Encounter (HOSPITAL_COMMUNITY): Payer: Self-pay

## 2012-11-23 ENCOUNTER — Ambulatory Visit (HOSPITAL_COMMUNITY)
Admission: RE | Admit: 2012-11-23 | Discharge: 2012-11-23 | Disposition: A | Discharge status: Home / Self Care | Payer: Medicare Other | Source: Ambulatory Visit | Attending: Cardiovascular Disease | Admitting: Cardiovascular Disease

## 2012-11-23 DIAGNOSIS — R079 Chest pain, unspecified: Secondary | ICD-10-CM | POA: Diagnosis not present

## 2012-11-23 DIAGNOSIS — R0789 Other chest pain: Secondary | ICD-10-CM | POA: Diagnosis not present

## 2012-11-23 DIAGNOSIS — E785 Hyperlipidemia, unspecified: Secondary | ICD-10-CM | POA: Diagnosis not present

## 2012-11-23 MED ORDER — TECHNETIUM TC 99M SESTAMIBI - CARDIOLITE
30.0000 | Freq: Once | INTRAVENOUS | Status: AC | PRN
  Administered 2012-11-23: 12:00:00 30 via INTRAVENOUS

## 2012-11-23 MED ORDER — TECHNETIUM TC 99M SESTAMIBI GENERIC - CARDIOLITE
10.0000 | Freq: Once | INTRAVENOUS | Status: AC | PRN
  Administered 2012-11-23: 10 via INTRAVENOUS

## 2012-11-23 MED ORDER — REGADENOSON 0.4 MG/5ML IV SOLN
INTRAVENOUS | Status: AC
  Administered 2012-11-23: 0.4 mg via INTRAVENOUS
  Filled 2012-11-23: qty 5

## 2012-11-23 MED ORDER — SODIUM CHLORIDE 0.9 % IJ SOLN
INTRAMUSCULAR | Status: AC
  Administered 2012-11-23: 10 mL via INTRAVENOUS
  Filled 2012-11-23: qty 10

## 2012-11-23 NOTE — Progress Notes (Signed)
Stress Lab Nurses Notes - Sandra Williams  Sandra Williams 11/23/2012 Reason for doing test: Chest Pain Type of test: Marlane Hatcher Nurse performing test: Parke Poisson, RN Nuclear Medicine Tech: Lou Cal Echo Tech: Not Applicable MD performing test: Koneswaran/K.Lawrence NP Family MD: Felecia Shelling Test explained and consent signed: yes IV started: 22g jelco, Saline lock flushed, No redness or edema and Saline lock started in radiology Symptoms: discomfort in arm Treatment/Intervention: None Reason test stopped: protocol completed After recovery IV was: Discontinued via X-ray tech and No redness or edema Patient to return to Nuc. Med at : 12:15 Patient discharged: Home Patient's Condition upon discharge was: stable Comments: During test BP 151/67 & HR 66.  Recovery BP 91/69 & HR 55.  Symptoms resolved in recovery. Erskine Speed T

## 2012-12-18 ENCOUNTER — Ambulatory Visit (INDEPENDENT_AMBULATORY_CARE_PROVIDER_SITE_OTHER): Payer: Medicare Other | Admitting: Cardiovascular Disease

## 2012-12-18 ENCOUNTER — Encounter: Payer: Self-pay | Admitting: Cardiovascular Disease

## 2012-12-18 VITALS — BP 163/75 | HR 79 | Ht 62.5 in | Wt 144.5 lb

## 2012-12-18 DIAGNOSIS — R079 Chest pain, unspecified: Secondary | ICD-10-CM

## 2012-12-18 DIAGNOSIS — I10 Essential (primary) hypertension: Secondary | ICD-10-CM

## 2012-12-18 DIAGNOSIS — IMO0001 Reserved for inherently not codable concepts without codable children: Secondary | ICD-10-CM

## 2012-12-18 DIAGNOSIS — E785 Hyperlipidemia, unspecified: Secondary | ICD-10-CM

## 2012-12-18 DIAGNOSIS — Z136 Encounter for screening for cardiovascular disorders: Secondary | ICD-10-CM | POA: Diagnosis not present

## 2012-12-18 NOTE — Patient Instructions (Addendum)
Your physician recommends that you schedule a follow-up appointment in: AS NEEDED  

## 2012-12-18 NOTE — Progress Notes (Signed)
Patient ID: Sandra Williams, female   DOB: [birth year], 79 y.o.   MRN: [id number]      SUBJECTIVE: The patient is here to f/u on the results of cardiovascular testing performed for the evaluation of chest pain. Her nuclear stress test was normal, with no evidence of ischemia or scar. She has had no further episodes of chest pain. She feels well. She does not check her BP at home, but notes her sister has a cuff. She was experiencing headaches with the isosorbide mononitrate, so she was instructed to stop taking it by her PCP.    Allergies  Allergen Reactions  . Aspirin     REACTION: Upset stomach if takes regular and uncoated     Current Outpatient Prescriptions  Medication Sig Dispense Refill  . acetaminophen (TYLENOL) 325 MG tablet Take 2 tablets (650 mg total) by mouth every 4 (four) hours as needed.      Marland Kitchen aluminum & magnesium hydroxide-simethicone (MYLANTA) 500-450-40 MG/5ML suspension Take by mouth every 6 (six) hours as needed for indigestion.      Marland Kitchen HYDROcodone-acetaminophen (NORCO/VICODIN) 5-325 MG per tablet Take 1-2 tablets by mouth every 6 (six) hours as needed (severe pain).  20 tablet  0  . isosorbide mononitrate (IMDUR) 30 MG 24 hr tablet Take 1 tablet (30 mg total) by mouth daily.  90 tablet  3  . losartan (COZAAR) 100 MG tablet Take 100 mg by mouth every morning.       Marland Kitchen omeprazole (PRILOSEC) 20 MG capsule Take 20 mg by mouth 2 (two) times daily.       Marland Kitchen POTASSIUM PO Take 1 tablet by mouth daily.      . pravastatin (PRAVACHOL) 40 MG tablet Take 40 mg by mouth every morning.        No current facility-administered medications for this visit.    Past Medical History  Diagnosis Date  . Osteoporosis   . Arthritis   . HTN (hypertension)   . Hyperlipidemia   . Cancer     cecal carcinoma  . GERD (gastroesophageal reflux disease)   . Colon cancer 02/01/2012    Stage I (T2, N0, M0) cancer the cecum status post resection by Dr. Lovell Sheehan with 14 lymph nodes found all of  which were negative. She had no evidence for metastatic disease on CT scan of the abdomen and pelvis either. Her date of surgery was 10/22/2011.  Not in need of chemotherapy for her stage I cancer.   . Iron deficiency anemia 02/01/2012    At time of colon cancer presentation  . Knee joint pain 2014    right    Past Surgical History  Procedure Laterality Date  . Abdominal hysterectomy      partial  . Cataract extraction    . Cesarean section    . Dental implant    . Appendectomy      [facility]  . Partial colectomy  10/22/2011    Procedure: PARTIAL COLECTOMY;  Surgeon: Dalia Heading, MD;  Location: [facility] ORS;  Service: General;  Laterality: N/A;  . Colon surgery      History   Social History  . Marital Status: Single    Spouse Name: N/A    Number of Children: N/A  . Years of Education: N/A   Occupational History  . Not on file.   Social History Main Topics  . Smoking status: Never Smoker   . Smokeless tobacco: Never Used  . Alcohol Use: No  . Drug Use: No  .  Sexual Activity: No   Other Topics Concern  . Not on file   Social History Narrative  . No narrative on file     There were no vitals filed for this visit.  PHYSICAL EXAM General: NAD Neck: No JVD, no thyromegaly or thyroid nodule.  Lungs: Clear to auscultation bilaterally with normal respiratory effort. CV: Nondisplaced PMI.  Heart regular S1/S2, no S3/S4, soft I/VI systolic murmur at RUSB.  No peripheral edema.  No carotid bruit.  Normal pedal pulses.  Abdomen: Soft, nontender, no hepatosplenomegaly, no distention.  Neurologic: Alert and oriented x 3.  Psych: Normal affect. Extremities: No clubbing or cyanosis.   ECG: reviewed and available in electronic records.      ASSESSMENT AND PLAN: 1. Chest pain: her chest pain has resolved, and her Lexiscan Cardiolite stress test showed no evidence of inducible ischemia. She did not tolerate Imdur. 2. HTN: currently uncontrolled, but says that she gets nervous at  doctor's appointments. She is taking losartan. I've asked her to borrow her sister's BP cuff and check her own BP on occasion, to see if medication titration is necessary. 3. Hyperlipidemia: on pravastatin.  Dispo: f/u prn.  Prentice Docker, M.D., F.A.C.C.

## 2013-01-08 ENCOUNTER — Encounter (HOSPITAL_COMMUNITY): Payer: Medicare Other | Attending: Hematology and Oncology

## 2013-01-08 DIAGNOSIS — Z85038 Personal history of other malignant neoplasm of large intestine: Secondary | ICD-10-CM

## 2013-01-08 DIAGNOSIS — D509 Iron deficiency anemia, unspecified: Secondary | ICD-10-CM | POA: Diagnosis not present

## 2013-01-08 DIAGNOSIS — Z09 Encounter for follow-up examination after completed treatment for conditions other than malignant neoplasm: Secondary | ICD-10-CM | POA: Insufficient documentation

## 2013-01-08 DIAGNOSIS — I10 Essential (primary) hypertension: Secondary | ICD-10-CM | POA: Insufficient documentation

## 2013-01-08 LAB — CBC WITH DIFFERENTIAL/PLATELET
BASOS ABS: 0 10*3/uL (ref 0.0–0.1)
Basophils Relative: 1 % (ref 0–1)
EOS PCT: 4 % (ref 0–5)
Eosinophils Absolute: 0.2 10*3/uL (ref 0.0–0.7)
HCT: 38.6 % (ref 36.0–46.0)
Hemoglobin: 12.2 g/dL (ref 12.0–15.0)
LYMPHS ABS: 1.9 10*3/uL (ref 0.7–4.0)
LYMPHS PCT: 37 % (ref 12–46)
MCH: 27.8 pg (ref 26.0–34.0)
MCHC: 31.6 g/dL (ref 30.0–36.0)
MCV: 87.9 fL (ref 78.0–100.0)
Monocytes Absolute: 0.4 10*3/uL (ref 0.1–1.0)
Monocytes Relative: 8 % (ref 3–12)
NEUTROS ABS: 2.6 10*3/uL (ref 1.7–7.7)
Neutrophils Relative %: 51 % (ref 43–77)
PLATELETS: 305 10*3/uL (ref 150–400)
RBC: 4.39 MIL/uL (ref 3.87–5.11)
RDW: 14.4 % (ref 11.5–15.5)
WBC: 5.1 10*3/uL (ref 4.0–10.5)

## 2013-01-08 LAB — COMPREHENSIVE METABOLIC PANEL
ALBUMIN: 3.5 g/dL (ref 3.5–5.2)
ALT: 14 U/L (ref 0–35)
AST: 18 U/L (ref 0–37)
Alkaline Phosphatase: 80 U/L (ref 39–117)
BUN: 12 mg/dL (ref 6–23)
CALCIUM: 9.1 mg/dL (ref 8.4–10.5)
CO2: 25 mEq/L (ref 19–32)
CREATININE: 0.76 mg/dL (ref 0.50–1.10)
Chloride: 107 mEq/L (ref 96–112)
GFR calc non Af Amer: 75 mL/min — ABNORMAL LOW (ref >90)
GFR, EST AFRICAN AMERICAN: 87 mL/min — AB (ref >90)
GLUCOSE: 105 mg/dL — AB (ref 70–99)
Potassium: 3.9 mEq/L (ref 3.7–5.3)
Sodium: 143 mEq/L (ref 137–147)
TOTAL PROTEIN: 7.4 g/dL (ref 6.0–8.3)
Total Bilirubin: 0.4 mg/dL (ref 0.3–1.2)

## 2013-01-08 LAB — FERRITIN: FERRITIN: 37 ng/mL (ref 10–291)

## 2013-01-08 LAB — IRON AND TIBC
IRON: 76 ug/dL (ref 42–135)
SATURATION RATIOS: 25 % (ref 20–55)
TIBC: 308 ug/dL (ref 250–470)
UIBC: 232 ug/dL (ref 125–400)

## 2013-01-08 NOTE — Progress Notes (Signed)
Labs drawn today for cbc/diff, Iron and IBC,ferr,cea,cmp

## 2013-01-09 LAB — CEA: CEA: 1.6 ng/mL (ref 0.0–5.0)

## 2013-01-11 NOTE — Progress Notes (Signed)
-  Now show, letter sent-

## 2013-01-12 ENCOUNTER — Ambulatory Visit (HOSPITAL_COMMUNITY): Payer: Medicare Other | Admitting: Oncology

## 2013-01-18 ENCOUNTER — Encounter (HOSPITAL_COMMUNITY): Payer: Self-pay | Admitting: Oncology

## 2013-01-18 ENCOUNTER — Encounter (HOSPITAL_BASED_OUTPATIENT_CLINIC_OR_DEPARTMENT_OTHER): Payer: Medicare Other | Admitting: Oncology

## 2013-01-18 VITALS — BP 124/59 | HR 88 | Temp 98.1°F | Resp 16 | Wt 143.6 lb

## 2013-01-18 DIAGNOSIS — C189 Malignant neoplasm of colon, unspecified: Secondary | ICD-10-CM

## 2013-01-18 DIAGNOSIS — Z85038 Personal history of other malignant neoplasm of large intestine: Secondary | ICD-10-CM | POA: Diagnosis not present

## 2013-01-18 NOTE — Patient Instructions (Signed)
[facility] Discharge Instructions  RECOMMENDATIONS MADE BY THE CONSULTANT AND ANY TEST RESULTS WILL BE SENT TO YOUR REFERRING PHYSICIAN.  EXAM FINDINGS BY THE PHYSICIAN TODAY AND SIGNS OR SYMPTOMS TO REPORT TO CLINIC OR PRIMARY PHYSICIAN: Exam and findings as discussed by Robynn Pane, PA - C.  Thing about getting a colonoscopy.  Report changes in bowel habits, blood in your bowel movement, unexplained weight loss or other problems.  MEDICATIONS PRESCRIBED:  none  INSTRUCTIONS/FOLLOW-UP: Follow-up in 6 months with blood work and office visit.  Thank you for choosing [facility] to provide your oncology and hematology care.  To afford each patient quality time with our providers, please arrive at least 15 minutes before your scheduled appointment time.  With your help, our goal is to use those 15 minutes to complete the necessary work-up to ensure our physicians have the information they need to help with your evaluation and healthcare recommendations.    Effective January 1st, 2014, we ask that you re-schedule your appointment with our physicians should you arrive 10 or more minutes late for your appointment.  We strive to give you quality time with our providers, and arriving late affects you and other patients whose appointments are after yours.    Again, thank you for choosing [facility].  Our hope is that these requests will decrease the amount of time that you wait before being seen by our physicians.       _____________________________________________________________  Should you have questions after your visit to [facility], please contact our office at [phone] between the hours of 8:30 a.m. and 5:00 p.m.  Voicemails left after 4:30 p.m. will not be returned until the following business day.  For prescription refill requests, have your pharmacy contact our office with your prescription refill request.

## 2013-01-18 NOTE — Progress Notes (Signed)
Rosita Fire, MD [place] [address]  Colon cancer - Plan: CBC with Differential, Comprehensive metabolic panel, CEA  CURRENT THERAPY: Surveillance per NCCN guidelines  INTERVAL HISTORY: Sandra Williams 80 y.o. female returns for  regular  visit for followup of Stage I (T2, N0, M0) cancer the cecum status post resection by Dr. Arnoldo Morale with 14 lymph nodes found all of which were negative. She had no evidence for metastatic disease on CT scan of the abdomen and pelvis either. Her date of surgery was 10/22/2011. Not in need of chemotherapy for her stage I cancer. AND Iron deficiency anemia at the time of presentation.  I personally reviewed and went over laboratory results with the patient.  Results follow below.   NCCN guidelines recommends colonoscopy 1 year from surgery for a T2 lesion without nodes or metastases.  She was noted to have a single sessile polyp with polypectomy.  Officially, we would recommend a colonoscopy if GI was agreeable per NCCN guidelines, but the patient is not sure if she desires future colonoscopy surveillance.  We discussed the risks, benefits, and alternatives to colonoscopy.  She has outlived her life expectancy at this time and therefore colonoscopy would be useful, but given her age and future life expectancy, I would like to have her input and monitor her conservatively if she so desires.  She admits to intermittent weakness that comes and goes.  She denies any specific complaints.  She denies any blood in her stool, black tarry stools, hematuria, change in bowel habits, frequency, consistency, appearance.  Oncologically, she denies any complaints and ROS questioning is negative.    Past Medical History  Diagnosis Date  . Osteoporosis   . Arthritis   . HTN (hypertension)   . Hyperlipidemia   . Cancer     cecal carcinoma  . GERD (gastroesophageal reflux disease)   . Colon cancer 02/01/2012    Stage I (T2, N0, M0) cancer the  cecum status post resection by Dr. Arnoldo Morale with 14 lymph nodes found all of which were negative. She had no evidence for metastatic disease on CT scan of the abdomen and pelvis either. Her date of surgery was 10/22/2011.  Not in need of chemotherapy for her stage I cancer.   . Iron deficiency anemia 02/01/2012    At time of colon cancer presentation  . Knee joint pain 2014    right    has HYPERLIPIDEMIA; HYPERTENSION, BENIGN ESSENTIAL; CONSTIPATION; OVERACTIVE BLADDER; ARTHRITIS; SHOULDER PAIN, RIGHT; KNEE PAIN, RIGHT; BACK PAIN; POSTMENOPAUSAL OSTEOPOROSIS; MEMORY LOSS; Colon cancer; Iron deficiency anemia; OA (osteoarthritis) of knee; Chest pain; and GERD (gastroesophageal reflux disease) on her problem list.     is allergic to aspirin.  Ms. Rodenbaugh does not currently have medications on file.  Past Surgical History  Procedure Laterality Date  . Abdominal hysterectomy      partial  . Cataract extraction    . Cesarean section    . Dental implant    . Appendectomy      [facility]  . Partial colectomy  10/22/2011    Procedure: PARTIAL COLECTOMY;  Surgeon: Jamesetta So, MD;  Location: [facility];  Service: General;  Laterality: N/A;  . Colon surgery      Denies any headaches, dizziness, double vision, fevers, chills, night sweats, nausea, vomiting, diarrhea, constipation, chest pain, heart palpitations, shortness of breath, blood in stool, black tarry stool, urinary pain, urinary burning, urinary frequency, hematuria.   PHYSICAL EXAMINATION  ECOG PERFORMANCE STATUS: 0 - Asymptomatic  Filed Vitals:   01/18/13 1321  BP: 124/59  Pulse: 88  Temp: 98.1 F (36.7 C)  Resp: 16    GENERAL:alert, healthy, no distress, well nourished, well developed, comfortable, cooperative and smiling SKIN: skin color, texture, turgor are normal, no rashes or significant lesions HEAD: Normocephalic, No masses, lesions, tenderness or abnormalities EYES: normal, PERRLA, EOMI, Conjunctiva are pink and  non-injected EARS: External ears normal OROPHARYNX:mucous membranes are moist  NECK: supple, trachea midline LYMPH:  not examined BREAST:not examined LUNGS: clear to auscultation  HEART: regular rate & rhythm, no murmurs and no gallops ABDOMEN:abdomen soft, non-tender and normal bowel sounds BACK: Back symmetric, no curvature. EXTREMITIES:less then 2 second capillary refill, no joint deformities, effusion, or inflammation, no clubbing, no cyanosis  NEURO: alert & oriented x 3 with fluent speech, no focal motor/sensory deficits, gait normal    LABORATORY DATA: CBC    Component Value Date/Time   WBC 5.1 01/08/2013 1112   RBC 4.39 01/08/2013 1112   HGB 12.2 01/08/2013 1112   HCT 38.6 01/08/2013 1112   PLT 305 01/08/2013 1112   MCV 87.9 01/08/2013 1112   MCH 27.8 01/08/2013 1112   MCHC 31.6 01/08/2013 1112   RDW 14.4 01/08/2013 1112   LYMPHSABS 1.9 01/08/2013 1112   MONOABS 0.4 01/08/2013 1112   EOSABS 0.2 01/08/2013 1112   BASOSABS 0.0 01/08/2013 1112      Chemistry      Component Value Date/Time   NA 143 01/08/2013 1112   K 3.9 01/08/2013 1112   CL 107 01/08/2013 1112   CO2 25 01/08/2013 1112   BUN 12 01/08/2013 1112   CREATININE 0.76 01/08/2013 1112      Component Value Date/Time   CALCIUM 9.1 01/08/2013 1112   ALKPHOS 80 01/08/2013 1112   AST 18 01/08/2013 1112   ALT 14 01/08/2013 1112   BILITOT 0.4 01/08/2013 1112     Lab Results  Component Value Date   CEA 1.6 01/08/2013      ASSESSMENT:  1. 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 Arnoldo Morale with 14 lymph nodes found all of which were negative. She had no evidence for metastatic disease on CT scan of the abdomen and pelvis either. Her date of surgery was 10/22/2011. Not in need of chemotherapy for her stage I cancer.  2. Iron deficiency anemia at the time of presentation. 3. Mild weakness, comes and goes  Patient Active Problem List   Diagnosis Date Noted  . GERD (gastroesophageal reflux disease) 10/23/2012  . Chest pain  10/22/2012  . OA (osteoarthritis) of knee 06/27/2012  . Colon cancer 02/01/2012  . Iron deficiency anemia 02/01/2012  . MEMORY LOSS 08/15/2008  . KNEE PAIN, RIGHT 08/12/2008  . CONSTIPATION 03/01/2008  . BACK PAIN 03/01/2008  . HYPERTENSION, BENIGN ESSENTIAL 01/19/2008  . SHOULDER PAIN, RIGHT 12/12/2007  . POSTMENOPAUSAL OSTEOPOROSIS 12/12/2006  . HYPERLIPIDEMIA 10/07/2006  . OVERACTIVE BLADDER 10/07/2006  . ARTHRITIS 10/07/2006    PLAN:  1. I personally reviewed and went over laboratory results with the patient. 2. Labs in 6 months: CBC diff, CMET, CEA 3. Patient asked to consider colonoscopy surveillance options.  If she desires to be monitored conservatively, will gladly oblige.  4. Return in 6 months for follow-up.  If all is well from an oncology standpoint in 6 months, it would be reasonable to see the patient yearly with CEAs annually.    THERAPY PLAN:  NCCN guidelines recommends a colonoscopy within 1 year following  surgery for a T2 N0 M0 lesion of colon.  She would be a candidate for repeat colonoscopy due to a sessile polyp found at time of diagnosis but if she desires conservative monitoring, I would gladly oblige due to her age and future life expectancy.  CEA and annual screening are not indicated, but we will check a CEA in 6 months and then yearly.  All questions were answered. The patient knows to call the clinic with any problems, questions or concerns. We can certainly see the patient much sooner if necessary.  Patient and plan discussed with Dr. Farrel Gobble and he is in agreement with the aforementioned.   Debie Ashline

## 2013-02-08 ENCOUNTER — Ambulatory Visit (HOSPITAL_COMMUNITY): Payer: Medicare Other

## 2013-02-08 ENCOUNTER — Other Ambulatory Visit (HOSPITAL_COMMUNITY): Payer: Medicare Other

## 2013-02-19 MED ORDER — ALBUMIN HUMAN 25 % IV SOLN
INTRAVENOUS | Status: AC
  Filled 2013-02-19: qty 50

## 2013-04-02 DIAGNOSIS — I2510 Atherosclerotic heart disease of native coronary artery without angina pectoris: Secondary | ICD-10-CM | POA: Diagnosis not present

## 2013-04-02 DIAGNOSIS — I10 Essential (primary) hypertension: Secondary | ICD-10-CM | POA: Diagnosis not present

## 2013-05-08 ENCOUNTER — Ambulatory Visit (INDEPENDENT_AMBULATORY_CARE_PROVIDER_SITE_OTHER): Payer: Medicare Other | Admitting: Orthopedic Surgery

## 2013-05-08 ENCOUNTER — Encounter: Payer: Self-pay | Admitting: Orthopedic Surgery

## 2013-05-08 VITALS — BP 119/61 | Ht 62.5 in | Wt 138.0 lb

## 2013-05-08 DIAGNOSIS — IMO0002 Reserved for concepts with insufficient information to code with codable children: Secondary | ICD-10-CM

## 2013-05-08 DIAGNOSIS — M179 Osteoarthritis of knee, unspecified: Secondary | ICD-10-CM

## 2013-05-08 DIAGNOSIS — M1710 Unilateral primary osteoarthritis, unspecified knee: Secondary | ICD-10-CM

## 2013-05-08 NOTE — Patient Instructions (Signed)
You have received a steroid shot. 15% of patients experience increased pain at the injection site with in the next 24 hours. This is best treated with ice and tylenol extra strength 2 tabs every 8 hours. If you are still having pain please call the office.    

## 2013-05-08 NOTE — Progress Notes (Signed)
Patient ID: Sandra Williams, female   DOB: [birth year], 80 y.o.   MRN: [id number]  Chief Complaint  Patient presents with  . Follow-up    Recheck right knee.    HISTORY: 80 year old female with end stage arthritis of her right knee presents for recurrent knee pain wants another injection  Knee  Injection Procedure Note  Pre-operative Diagnosis: right knee oa  Post-operative Diagnosis: same  Indications: pain  Anesthesia: ethyl chloride   Procedure Details   Verbal consent was obtained for the procedure. Time out was completed.The joint was prepped with alcohol, followed by  Ethyl chloride spray and A 20 gauge needle was inserted into the knee via lateral approach; 50ml 1% lidocaine and 1 ml of depomedrol  was then injected into the joint . The needle was removed and the area cleansed and dressed.  Complications:  None; patient tolerated the procedure well.

## 2013-07-03 DIAGNOSIS — R5383 Other fatigue: Secondary | ICD-10-CM | POA: Diagnosis not present

## 2013-07-03 DIAGNOSIS — E7800 Pure hypercholesterolemia, unspecified: Secondary | ICD-10-CM | POA: Diagnosis not present

## 2013-07-03 DIAGNOSIS — R5381 Other malaise: Secondary | ICD-10-CM | POA: Diagnosis not present

## 2013-07-03 DIAGNOSIS — I2510 Atherosclerotic heart disease of native coronary artery without angina pectoris: Secondary | ICD-10-CM | POA: Diagnosis not present

## 2013-07-03 DIAGNOSIS — I10 Essential (primary) hypertension: Secondary | ICD-10-CM | POA: Diagnosis not present

## 2013-07-03 DIAGNOSIS — Z23 Encounter for immunization: Secondary | ICD-10-CM | POA: Diagnosis not present

## 2013-07-05 ENCOUNTER — Encounter (HOSPITAL_COMMUNITY): Payer: Medicare Other | Attending: Hematology and Oncology

## 2013-07-05 DIAGNOSIS — Z85038 Personal history of other malignant neoplasm of large intestine: Secondary | ICD-10-CM | POA: Diagnosis not present

## 2013-07-05 DIAGNOSIS — Z860100 Personal history of colon polyps, unspecified: Secondary | ICD-10-CM | POA: Insufficient documentation

## 2013-07-05 DIAGNOSIS — Z9049 Acquired absence of other specified parts of digestive tract: Secondary | ICD-10-CM | POA: Diagnosis not present

## 2013-07-05 DIAGNOSIS — D509 Iron deficiency anemia, unspecified: Secondary | ICD-10-CM | POA: Diagnosis not present

## 2013-07-05 DIAGNOSIS — C189 Malignant neoplasm of colon, unspecified: Secondary | ICD-10-CM

## 2013-07-05 LAB — CBC WITH DIFFERENTIAL/PLATELET
BASOS ABS: 0 10*3/uL (ref 0.0–0.1)
Basophils Relative: 1 % (ref 0–1)
EOS PCT: 4 % (ref 0–5)
Eosinophils Absolute: 0.2 10*3/uL (ref 0.0–0.7)
HEMATOCRIT: 37.8 % (ref 36.0–46.0)
Hemoglobin: 12.1 g/dL (ref 12.0–15.0)
LYMPHS PCT: 42 % (ref 12–46)
Lymphs Abs: 1.9 10*3/uL (ref 0.7–4.0)
MCH: 27.8 pg (ref 26.0–34.0)
MCHC: 32 g/dL (ref 30.0–36.0)
MCV: 86.9 fL (ref 78.0–100.0)
MONO ABS: 0.3 10*3/uL (ref 0.1–1.0)
Monocytes Relative: 6 % (ref 3–12)
Neutro Abs: 2.2 10*3/uL (ref 1.7–7.7)
Neutrophils Relative %: 47 % (ref 43–77)
Platelets: 301 10*3/uL (ref 150–400)
RBC: 4.35 MIL/uL (ref 3.87–5.11)
RDW: 14.6 % (ref 11.5–15.5)
WBC: 4.7 10*3/uL (ref 4.0–10.5)

## 2013-07-05 LAB — COMPREHENSIVE METABOLIC PANEL
ALT: 14 U/L (ref 0–35)
AST: 20 U/L (ref 0–37)
Albumin: 3.5 g/dL (ref 3.5–5.2)
Alkaline Phosphatase: 80 U/L (ref 39–117)
Anion gap: 10 (ref 5–15)
BILIRUBIN TOTAL: 0.3 mg/dL (ref 0.3–1.2)
BUN: 16 mg/dL (ref 6–23)
CHLORIDE: 108 meq/L (ref 96–112)
CO2: 26 mEq/L (ref 19–32)
CREATININE: 0.69 mg/dL (ref 0.50–1.10)
Calcium: 9.1 mg/dL (ref 8.4–10.5)
GFR calc non Af Amer: 77 mL/min — ABNORMAL LOW (ref >90)
GFR, EST AFRICAN AMERICAN: 89 mL/min — AB (ref >90)
Glucose, Bld: 97 mg/dL (ref 70–99)
Potassium: 4 mEq/L (ref 3.7–5.3)
SODIUM: 144 meq/L (ref 137–147)
Total Protein: 7.4 g/dL (ref 6.0–8.3)

## 2013-07-05 LAB — CEA: CEA: 1.8 ng/mL (ref 0.0–5.0)

## 2013-07-05 NOTE — Progress Notes (Signed)
LABS DRAWN FOR CBCD, CMP, CEA

## 2013-07-10 NOTE — Progress Notes (Signed)
Rosita Fire, MD [place] [address]  Colon cancer  Iron deficiency anemia  CURRENT THERAPY: Surveillance per NCCN guidelines  INTERVAL HISTORY: Sandra Williams 80 y.o. female returns for  regular  visit for followup of Stage I (T2, N0, M0) cancer the cecum status post resection by Dr. Arnoldo Morale with 14 lymph nodes found all of which were negative. She had no evidence for metastatic disease on CT scan of the abdomen and pelvis either. Her date of surgery was 10/22/2011. Not in need of chemotherapy for her stage I cancer. AND Iron deficiency anemia at the time of presentation.  I personally reviewed and went over laboratory results with the patient.  The results are noted within this dictation.  NCCN guidelines recommends colonoscopy 1 year from surgery for a T2 lesion without nodes or metastases. She was noted to have a single sessile polyp with polypectomy. Officially, we would recommend a colonoscopy if GI was agreeable per NCCN guidelines, but the patient is not sure if she desires future colonoscopy surveillance. We discussed the risks, benefits, and alternatives to colonoscopy. She has outlived her life expectancy at this time and therefore colonoscopy would be useful, but given her age and future life expectancy, I would like to have her input and monitor her conservatively if she so desires.  She reports that following meals, sometimes she has to go to the bathroom immediately,  She asks if she can take anything for this.  She may try OTC Imodium for this symptom, but it is likely secondary to her colon surgery in the past.  Otherwise, she denies any oncology complaints and ROS questioning is negative.    Past Medical History  Diagnosis Date  . Osteoporosis   . Arthritis   . HTN (hypertension)   . Hyperlipidemia   . Cancer     cecal carcinoma  . GERD (gastroesophageal reflux disease)   . Colon cancer 02/01/2012    Stage I (T2, N0, M0) cancer the  cecum status post resection by Dr. Arnoldo Morale with 14 lymph nodes found all of which were negative. She had no evidence for metastatic disease on CT scan of the abdomen and pelvis either. Her date of surgery was 10/22/2011.  Not in need of chemotherapy for her stage I cancer.   . Iron deficiency anemia 02/01/2012    At time of colon cancer presentation  . Knee joint pain 2014    right    has HYPERLIPIDEMIA; HYPERTENSION, BENIGN ESSENTIAL; CONSTIPATION; OVERACTIVE BLADDER; ARTHRITIS; SHOULDER PAIN, RIGHT; KNEE PAIN, RIGHT; BACK PAIN; POSTMENOPAUSAL OSTEOPOROSIS; MEMORY LOSS; Colon cancer; Iron deficiency anemia; OA (osteoarthritis) of knee; Chest pain; and GERD (gastroesophageal reflux disease) on her problem list.     is allergic to aspirin.  Ms. Kreiger had no medications administered during this visit.  Past Surgical History  Procedure Laterality Date  . Abdominal hysterectomy      partial  . Cataract extraction    . Cesarean section    . Dental implant    . Appendectomy      [facility]  . Partial colectomy  10/22/2011    Procedure: PARTIAL COLECTOMY;  Surgeon: Jamesetta So, MD;  Location: [facility];  Service: General;  Laterality: N/A;  . Colon surgery      Denies any headaches, dizziness, double vision, fevers, chills, night sweats, nausea, vomiting, diarrhea, constipation, chest pain, heart palpitations, shortness of breath, blood in stool, black tarry stool, urinary pain, urinary burning, urinary frequency, hematuria.   PHYSICAL  EXAMINATION  ECOG PERFORMANCE STATUS: 1 - Symptomatic but completely ambulatory  Filed Vitals:   07/12/13 1357  BP: 111/72  Pulse: 97  Temp: 98 F (36.7 C)  Resp: 20    GENERAL:alert, no distress, well nourished, well developed, comfortable, cooperative and smiling SKIN: skin color, texture, turgor are normal, no rashes or significant lesions HEAD: Normocephalic, No masses, lesions, tenderness or abnormalities EYES: normal, PERRLA, EOMI, Conjunctiva  are pink and non-injected EARS: External ears normal OROPHARYNX:mucous membranes are moist  NECK: supple, thyroid normal size, non-tender, without nodularity, no stridor, non-tender, trachea midline LYMPH:  no palpable lymphadenopathy BREAST:not examined LUNGS: clear to auscultation  HEART: regular rate & rhythm, no murmurs and no gallops ABDOMEN:abdomen soft, non-tender and normal bowel sounds BACK: Back symmetric, no curvature. EXTREMITIES:less then 2 second capillary refill, no joint deformities, effusion, or inflammation, no edema, no skin discoloration, no clubbing, no cyanosis  NEURO: alert & oriented x 3 with fluent speech, no focal motor/sensory deficits, gait normal   LABORATORY DATA: CBC    Component Value Date/Time   WBC 4.7 07/05/2013 0926   RBC 4.35 07/05/2013 0926   HGB 12.1 07/05/2013 0926   HCT 37.8 07/05/2013 0926   PLT 301 07/05/2013 0926   MCV 86.9 07/05/2013 0926   MCH 27.8 07/05/2013 0926   MCHC 32.0 07/05/2013 0926   RDW 14.6 07/05/2013 0926   LYMPHSABS 1.9 07/05/2013 0926   MONOABS 0.3 07/05/2013 0926   EOSABS 0.2 07/05/2013 0926   BASOSABS 0.0 07/05/2013 0926      Chemistry      Component Value Date/Time   NA 144 07/05/2013 0926   K 4.0 07/05/2013 0926   CL 108 07/05/2013 0926   CO2 26 07/05/2013 0926   BUN 16 07/05/2013 0926   CREATININE 0.69 07/05/2013 0926      Component Value Date/Time   CALCIUM 9.1 07/05/2013 0926   ALKPHOS 80 07/05/2013 0926   AST 20 07/05/2013 0926   ALT 14 07/05/2013 0926   BILITOT 0.3 07/05/2013 0926     Lab Results  Component Value Date   CEA 1.8 07/05/2013      ASSESSMENT:  1. 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 Arnoldo Morale with 14 lymph nodes found all of which were negative. She had no evidence for metastatic disease on CT scan of the abdomen and pelvis either. Her date of surgery was 10/22/2011. Not in need of chemotherapy for her stage I cancer.  2. Iron deficiency anemia at the time of presentation.  Patient Active Problem  List   Diagnosis Date Noted  . GERD (gastroesophageal reflux disease) 10/23/2012  . Chest pain 10/22/2012  . OA (osteoarthritis) of knee 06/27/2012  . Colon cancer 02/01/2012  . Iron deficiency anemia 02/01/2012  . MEMORY LOSS 08/15/2008  . KNEE PAIN, RIGHT 08/12/2008  . CONSTIPATION 03/01/2008  . BACK PAIN 03/01/2008  . HYPERTENSION, BENIGN ESSENTIAL 01/19/2008  . SHOULDER PAIN, RIGHT 12/12/2007  . POSTMENOPAUSAL OSTEOPOROSIS 12/12/2006  . HYPERLIPIDEMIA 10/07/2006  . OVERACTIVE BLADDER 10/07/2006  . ARTHRITIS 10/07/2006     PLAN:  1. I personally reviewed and went over laboratory results with the patient.  2. Labs in 12 months: CBC diff, CMET, CEA  3. Patient asked to consider colonoscopy surveillance options. If she desires to be monitored conservatively, will gladly oblige.  4. Return in 12 months for follow-up   THERAPY PLAN:  NCCN guidelines recommends a colonoscopy within 1 year following surgery for a T2  N0 M0 lesion of colon. She would be a candidate for repeat colonoscopy due to a sessile polyp found at time of diagnosis but if she desires conservative monitoring, I would gladly oblige due to her age and future life expectancy. CEA and annual screening are not indicated, but we will check a CEA in 12 months.   All questions were answered. The patient knows to call the clinic with any problems, questions or concerns. We can certainly see the patient much sooner if necessary.  Patient and plan discussed with Dr. Farrel Gobble and he is in agreement with the aforementioned.   KEFALAS,THOMAS 07/12/2013

## 2013-07-12 ENCOUNTER — Encounter (HOSPITAL_BASED_OUTPATIENT_CLINIC_OR_DEPARTMENT_OTHER): Payer: Medicare Other | Admitting: Oncology

## 2013-07-12 VITALS — BP 111/72 | HR 97 | Temp 98.0°F | Resp 20 | Wt 140.9 lb

## 2013-07-12 DIAGNOSIS — D509 Iron deficiency anemia, unspecified: Secondary | ICD-10-CM

## 2013-07-12 DIAGNOSIS — C189 Malignant neoplasm of colon, unspecified: Secondary | ICD-10-CM | POA: Diagnosis not present

## 2013-07-12 NOTE — Addendum Note (Signed)
Addended by: Baird Cancer on: 07/12/2013 02:29 PM   Modules accepted: Orders

## 2013-07-12 NOTE — Patient Instructions (Signed)
[facility] Discharge Instructions  RECOMMENDATIONS MADE BY THE CONSULTANT AND ANY TEST RESULTS WILL BE SENT TO YOUR REFERRING PHYSICIAN. We will see you in 1 year.  For repeat labs and a doctor's appointment. Please call for any questions or concerns.   Thank you for choosing [facility] to provide your oncology and hematology care.  To afford each patient quality time with our providers, please arrive at least 15 minutes before your scheduled appointment time.  With your help, our goal is to use those 15 minutes to complete the necessary work-up to ensure our physicians have the information they need to help with your evaluation and healthcare recommendations.    Effective January 1st, 2014, we ask that you re-schedule your appointment with our physicians should you arrive 10 or more minutes late for your appointment.  We strive to give you quality time with our providers, and arriving late affects you and other patients whose appointments are after yours.    Again, thank you for choosing [facility].  Our hope is that these requests will decrease the amount of time that you wait before being seen by our physicians.       _____________________________________________________________  Should you have questions after your visit to [facility], please contact our office at [phone] between the hours of 8:30 a.m. and 4:30 p.m.  Voicemails left after 4:30 p.m. will not be returned until the following business day.  For prescription refill requests, have your pharmacy contact our office with your prescription refill request.    _______________________________________________________________  We hope that we have given you very good care.  You may receive a patient satisfaction survey in the mail, please complete it and return it as soon as possible.  We value your  feedback!  _______________________________________________________________  Have you asked about our STAR program?  STAR stands for Survivorship Training and Rehabilitation, and this is a nationally recognized cancer care program that focuses on survivorship and rehabilitation.  Cancer and cancer treatments may cause problems, such as, pain, making you feel tired and keeping you from doing the things that you need or want to do. Cancer rehabilitation can help. Our goal is to reduce these troubling effects and help you have the best quality of life possible.  You may receive a survey from a nurse that asks questions about your current state of health.  Based on the survey results, all eligible patients will be referred to the [facility] program for an evaluation so we can better serve you!  A frequently asked questions sheet is available upon request.

## 2013-08-09 ENCOUNTER — Ambulatory Visit (INDEPENDENT_AMBULATORY_CARE_PROVIDER_SITE_OTHER): Payer: Medicare Other | Admitting: Orthopedic Surgery

## 2013-08-09 VITALS — BP 130/81 | Ht 62.5 in | Wt 140.9 lb

## 2013-08-09 DIAGNOSIS — M1710 Unilateral primary osteoarthritis, unspecified knee: Secondary | ICD-10-CM | POA: Diagnosis not present

## 2013-08-09 DIAGNOSIS — M1711 Unilateral primary osteoarthritis, right knee: Secondary | ICD-10-CM

## 2013-08-09 NOTE — Progress Notes (Signed)
Chief Complaint  Patient presents with  . Follow-up    3 month recheck Rt knee/repeat injection    Pain right knee comes in for repeat injection. We also advised her to wear brace on her knee with an economy hinged wrap on style  Inject right knee   Injection RIGHT knee Verbal consent and timeout were completed for a RIGHT injection Under sterile conditions the RIGHT knee was injected from a lateral approach.  Medication depomedrol 40 mg and Lidocaine plain 3 cc   A sterile dressing was applied there were no complications

## 2013-08-09 NOTE — Patient Instructions (Signed)
Epidural Steroid Injection, Care After   Refer to this sheet in the next few weeks. These instructions provide you with information on caring for yourself after your procedure. Your health care provider may also give you more specific instructions. Your treatment has been planned according to current medical practices, but problems sometimes occur. Call your health care provider if you have any problems or questions after your procedure.  WHAT TO EXPECT AFTER THE PROCEDURE  After your procedure, it is typical to have minimal discomfort at the injection site.  HOME CARE INSTRUCTIONS   · Avoid the use of heat on the injection site for 1 day.  · Do not take a tub bath or soak in water the day of the procedure.  · Remove the bandage on the day after the procedure.  · Resume your normal activities the day after the procedure.  · Apply ice to reduce the soreness around the injection site:  ¨ Put ice in a plastic bag.  ¨ Place a towel between your skin and the bag.  ¨ Leave the ice on for 20 minutes, 2-3 times a day.  · Follow up with your health care provider 7-10 days after the procedure.  SEEK MEDICAL CARE IF:   · You have a fever.  · You continue to have pain and soreness around the injection site, even after taking medicines.  · You have significant nausea or vomiting.  · You have a severe headache.  SEEK IMMEDIATE MEDICAL CARE IF:   · You have severe pain at the injection site, which is not relieved by medicines.  · You have a severe headache, stiff neck, or sensitivity to light.  · You have any new numbness or weakness in your legs or arms.  · You lose control over your bladder or bowel movements.  · You have difficulty breathing.  Document Released: 04/07/2010 Document Revised: 08/23/2012 Document Reviewed: 06/09/2012  ExitCare® Patient Information ©2015 [employer]. This information is not intended to replace advice given to you by your health care provider. Make sure you discuss any questions you have with your  health care provider.

## 2013-09-20 ENCOUNTER — Ambulatory Visit: Payer: Medicare Other | Admitting: Orthopedic Surgery

## 2013-10-02 DIAGNOSIS — I10 Essential (primary) hypertension: Secondary | ICD-10-CM | POA: Diagnosis not present

## 2013-10-02 DIAGNOSIS — IMO0002 Reserved for concepts with insufficient information to code with codable children: Secondary | ICD-10-CM | POA: Diagnosis not present

## 2013-10-02 DIAGNOSIS — I2510 Atherosclerotic heart disease of native coronary artery without angina pectoris: Secondary | ICD-10-CM | POA: Diagnosis not present

## 2013-10-02 DIAGNOSIS — M1710 Unilateral primary osteoarthritis, unspecified knee: Secondary | ICD-10-CM | POA: Diagnosis not present

## 2013-10-02 DIAGNOSIS — Z23 Encounter for immunization: Secondary | ICD-10-CM | POA: Diagnosis not present

## 2013-10-03 ENCOUNTER — Ambulatory Visit (INDEPENDENT_AMBULATORY_CARE_PROVIDER_SITE_OTHER): Payer: Medicare Other | Admitting: Orthopedic Surgery

## 2013-10-03 ENCOUNTER — Encounter: Payer: Self-pay | Admitting: Orthopedic Surgery

## 2013-10-03 VITALS — BP 143/76 | Ht 62.5 in | Wt 140.9 lb

## 2013-10-03 DIAGNOSIS — M1711 Unilateral primary osteoarthritis, right knee: Secondary | ICD-10-CM

## 2013-10-03 DIAGNOSIS — M1710 Unilateral primary osteoarthritis, unspecified knee: Secondary | ICD-10-CM

## 2013-10-03 DIAGNOSIS — M5430 Sciatica, unspecified side: Secondary | ICD-10-CM | POA: Diagnosis not present

## 2013-10-03 DIAGNOSIS — M5431 Sciatica, right side: Secondary | ICD-10-CM | POA: Insufficient documentation

## 2013-10-03 MED ORDER — PREDNISONE (PAK) 5 MG PO TABS: ORAL_TABLET | ORAL | Status: DC

## 2013-10-03 NOTE — Progress Notes (Signed)
Chief Complaint  Patient presents with  . Follow-up    6 week recheck Rt knee s/p injection    The patient had a repeat injection in her right knee she is a first to have a knee replacement she is apprehensive. She just saw her primary care physician Dr. Legrand Rams and he deemed her medically fit for surgery if she would want to go ahead with that. She still having pain in her knee and now has radicular pain in her right leg from her hip to her knee with no history of back injury no bowel or bladder dysfunction no pain below the knee  After discussing with her and her daughter and showing the model and diagram reviewing the surgical procedure, hospital stay, rehabilitation, risks of infection, blood clots, stiffness, pain, need for rehabilitation to prevent postop stiffness she is now decided to proceed with knee replacement surgery  Time spent 15 but not more than 20 minutes  I also gave her 5 mg 12 day dose pack for newly diagnosed in Set sciatica

## 2013-10-03 NOTE — Patient Instructions (Signed)
You have decided to proceed with knee replacement surgery. You have decided not to continue with nonoperative measures such as but not limited to oral medication, weight loss, activity modification, physical therapy, bracing, or injection.  We will perform the procedure commonly known as total knee replacement. Some of the risks associated with knee replacement surgery include but are not limited to Bleeding Infection Swelling Stiffness Blood clot Pain that persists even after surgery  Infection is especially devastating complication of knee surgery although rare. If infection does occur your implant will usually have to be removed and several surgeries will be needed to eradicate the infection prior to performing a repeat replacement.   If you're not comfortable with these risks and would like to continue with nonoperative treatment please let Dr. Aline Brochure know prior to your surgery.    Surgery 11/14/13

## 2013-10-04 ENCOUNTER — Ambulatory Visit: Payer: Medicare Other | Admitting: Orthopedic Surgery

## 2013-10-08 ENCOUNTER — Other Ambulatory Visit: Payer: Self-pay | Admitting: *Deleted

## 2013-10-08 MED ORDER — BUPIVACAINE LIPOSOME 1.3 % IJ SUSP: 20.0000 mL | Freq: Once | INTRAMUSCULAR | Status: DC

## 2013-10-26 ENCOUNTER — Encounter: Payer: Self-pay | Admitting: Orthopedic Surgery

## 2013-11-08 ENCOUNTER — Other Ambulatory Visit (HOSPITAL_COMMUNITY): Payer: Medicare Other

## 2013-11-14 ENCOUNTER — Inpatient Hospital Stay (HOSPITAL_COMMUNITY): Admission: RE | Admit: 2013-11-14 | Payer: Medicare Other | Source: Ambulatory Visit | Admitting: Orthopedic Surgery

## 2013-11-14 ENCOUNTER — Encounter (HOSPITAL_COMMUNITY): Admission: RE | Payer: Self-pay | Source: Ambulatory Visit

## 2013-11-14 SURGERY — ARTHROPLASTY, KNEE, TOTAL
Anesthesia: Choice | Laterality: Right

## 2013-11-27 ENCOUNTER — Encounter: Payer: Self-pay | Admitting: Orthopedic Surgery

## 2013-11-27 ENCOUNTER — Ambulatory Visit: Payer: Medicare Other | Admitting: Orthopedic Surgery

## 2013-12-11 ENCOUNTER — Encounter: Payer: Self-pay | Admitting: Orthopedic Surgery

## 2013-12-11 ENCOUNTER — Ambulatory Visit (INDEPENDENT_AMBULATORY_CARE_PROVIDER_SITE_OTHER): Payer: Medicare Other | Admitting: Orthopedic Surgery

## 2013-12-11 VITALS — BP 149/82 | Ht 62.5 in | Wt 140.9 lb

## 2013-12-11 DIAGNOSIS — M4806 Spinal stenosis, lumbar region: Secondary | ICD-10-CM

## 2013-12-11 DIAGNOSIS — M170 Bilateral primary osteoarthritis of knee: Secondary | ICD-10-CM | POA: Diagnosis not present

## 2013-12-11 DIAGNOSIS — M75100 Unspecified rotator cuff tear or rupture of unspecified shoulder, not specified as traumatic: Secondary | ICD-10-CM | POA: Insufficient documentation

## 2013-12-11 DIAGNOSIS — M12819 Other specific arthropathies, not elsewhere classified, unspecified shoulder: Secondary | ICD-10-CM

## 2013-12-11 DIAGNOSIS — M48061 Spinal stenosis, lumbar region without neurogenic claudication: Secondary | ICD-10-CM

## 2013-12-11 HISTORY — DX: Other specific arthropathies, not elsewhere classified, unspecified shoulder: M12.819

## 2013-12-11 HISTORY — DX: Unspecified rotator cuff tear or rupture of unspecified shoulder, not specified as traumatic: M75.100

## 2013-12-11 NOTE — Patient Instructions (Signed)
SURGERY    01/30/14        RIGHT TOTAL KNEE  You have decided to proceed with knee replacement surgery. You have decided not to continue with nonoperative measures such as but not limited to oral medication, weight loss, activity modification, physical therapy, bracing, or injection.  We will perform the procedure commonly known as total knee replacement. Some of the risks associated with knee replacement surgery include but are not limited to Bleeding Infection Swelling Stiffness Blood clot Pain that persists even after surgery  Infection is especially devastating complication of knee surgery although rare. If infection does occur your implant will usually have to be removed and several surgeries will be needed to eradicate the infection prior to performing a repeat replacement.   If  you're not comfortable with these risks and would like to continue with nonoperative treatment please let Dr. Aline Brochure know prior to your surgery.

## 2013-12-12 ENCOUNTER — Encounter: Payer: Self-pay | Admitting: Orthopedic Surgery

## 2013-12-12 ENCOUNTER — Other Ambulatory Visit: Payer: Self-pay | Admitting: *Deleted

## 2013-12-12 MED ORDER — BUPIVACAINE LIPOSOME 1.3 % IJ SUSP: 20.0000 mL | Freq: Once | INTRAMUSCULAR | Status: DC

## 2013-12-12 NOTE — Progress Notes (Signed)
Patient ID: Sandra Williams, female   DOB: 09-Jan-1925, 80 y.o.   MRN: [id number] Sandra Williams comes in today to discuss and plan for right total knee replacement surgery. She is also complaining of some mild sciatica on the right lower extremity radiating from her back into her right lateral thigh up to but not below her knee  She however reports consistent pain in her knee with loss of function in the following history  Chief Complaint   Patient presents with   .  Knee Pain       Right knee pain, no injury.    History 80 year-old female  complains of throbbing 8/10 intermittent right knee cramping and painwhich is improved with walking worse when she is sitting down or getting up from a chair she is intermittent swelling in the right knee she has mild functional deficits but nothing severe.  Review of systems history of fever diarrhea swelling.dizziness but otherwise negative  No allergies  Medications aspirin losartan and pravastatin  History of colorectal cancer with resection. Family history of arthritis and cancer. She is obviously retired does not smoke drink use caffeine.  BP 142/70  Ht 5\' 6"  (1.676 m)  Wt 140 lb (63.504 kg)  BMI 22.61 kg/m2 General appearance is normal, the patient is alert and oriented x3 with normal mood and affect. Upper extremity exam  The right and left upper extremity:     Inspection and palpation revealed no abnormalities in the upper extremities.    Range of motion is full without contracture.  Motor exam is normal with grade 5 strength.  The joints are fully reduced without subluxation.  There is no atrophy or tremor and muscle tone is normal.  All joints are stable.   Left lower extremity no tenderness or swelling full range of motion normal stability tests normal strength normal skin normal pulses normal sensation bilateral reflexes normal no lymphadenopathy and balance is normal  Right lower extremity she has tenderness over the medial  and lateral compartments of the knee with crepitance on range of motion all ligaments are stable strength is intact no skin lesions were noted pulse and temperature are normal without edema. Swelling none. Lymph nodes negative. Sensation normal. Reflexes nonpathologic. Coordination normal.  Imaging studies show osteoarthritis of the right knee moderate to severe  She is scheduled for right total knee replacement

## 2013-12-12 NOTE — H&P (Addendum)
TOTAL KNEE ADMISSION H&P  Patient is being admitted for right total knee arthroplasty.  Subjective:  Chief Complaint:right knee pain.  HPI: Sandra Williams, 80 y.o. female, has a history of pain and functional disability in the right knee due to arthritis and has failed non-surgical conservative treatments for greater than 12 weeks to includeNSAID's and/or analgesics, corticosteriod injections, use of assistive devices and activity modification.  Onset of symptoms was gradual, starting >10 years ago with gradually worsening course since that time.   She complains of throbbing 8/10 intermittent right knee cramping and painwhich is improved with walking worse when she is sitting down or getting up from a chair she is intermittent swelling in the right knee she has mild functional deficits but nothing severe.  Review of systems history of fever diarrhea swelling.dizziness but otherwise negative  No allergies  Medications aspirin losartan and pravastatin  History of colorectal cancer with resection. Family history of arthritis and cancer. She is obviously retired does not smoke drink use caffeine.   Right lower extremity she has tenderness over the medial and lateral compartments of the knee with crepitance on range of motion all ligaments are stable strength is intact no skin lesions were noted pulse and temperature are normal without edema. Swelling none. Lymph nodes negative. Sensation normal. Reflexes nonpathologic. Coordination normal.   Patient Active Problem List   Diagnosis Date Noted  . Rotator cuff tear arthropathy 12/11/2013  . Sciatica 10/03/2013  . Primary osteoarthritis of right knee 10/03/2013  . GERD (gastroesophageal reflux disease) 10/23/2012  . Chest pain 10/22/2012  . OA (osteoarthritis) of knee 06/27/2012  . Colon cancer 02/01/2012  . Iron deficiency anemia 02/01/2012  . MEMORY LOSS 08/15/2008  . KNEE PAIN, RIGHT 08/12/2008  . CONSTIPATION 03/01/2008  . Spinal  stenosis of lumbar region 03/01/2008  . HYPERTENSION, BENIGN ESSENTIAL 01/19/2008  . SHOULDER PAIN, RIGHT 12/12/2007  . POSTMENOPAUSAL OSTEOPOROSIS 12/12/2006  . HYPERLIPIDEMIA 10/07/2006  . OVERACTIVE BLADDER 10/07/2006  . ARTHRITIS 10/07/2006   Past Medical History  Diagnosis Date  . Osteoporosis   . Arthritis   . HTN (hypertension)   . Hyperlipidemia   . Cancer     cecal carcinoma  . GERD (gastroesophageal reflux disease)   . Colon cancer 02/01/2012    Stage I (T2, N0, M0) cancer the cecum status post resection by Dr. Arnoldo Morale with 14 lymph nodes found all of which were negative. She had no evidence for metastatic disease on CT scan of the abdomen and pelvis either. Her date of surgery was 10/22/2011.  Not in need of chemotherapy for her stage I cancer.   . Iron deficiency anemia 02/01/2012    At time of colon cancer presentation  . Knee joint pain 2014    right    Past Surgical History  Procedure Laterality Date  . Abdominal hysterectomy      partial  . Cataract extraction    . Cesarean section    . Dental implant    . Appendectomy      [facility]  . Partial colectomy  10/22/2011    Procedure: PARTIAL COLECTOMY;  Surgeon: Jamesetta So, MD;  Location: [facility];  Service: General;  Laterality: N/A;  . Colon surgery       (Not in a hospital admission) Allergies  Allergen Reactions  . Aspirin     REACTION: Upset stomach if takes regular and uncoated     History  Substance Use Topics  . Smoking status: Never Smoker   . Smokeless  tobacco: Never Used  . Alcohol Use: No    Family History  Problem Relation Age of Onset  . Breast cancer Daughter   . Hypertension Daughter   . Colon cancer Neg Hx   . Liver disease Neg Hx   . Hypertension Mother   . Cancer Sister      ROS  Objective:  Physical Exam  BP 149/82 mmHg  Ht 5' 2.5" (1.588 m)  Wt 140 lb 14.4 oz (63.912 kg)  BMI 25.34 kg/m2  BP 142/70  Ht 5\' 6"  (1.676 m)  Wt 140 lb (63.504 kg)  BMI 22.61 kg/m2 General  appearance is normal, the patient is alert and oriented x3 with normal mood and affect. Upper extremity exam  The right and left upper extremity:     Inspection and palpation revealed no abnormalities in the upper extremities.    Range of motion is full without contracture.  Motor exam is normal with grade 5 strength.  The joints are fully reduced without subluxation.  There is no atrophy or tremor and muscle tone is normal.  All joints are stable.   Left lower extremity no tenderness or swelling full range of motion normal stability tests normal strength normal skin normal pulses normal sensation bilateral reflexes normal no lymphadenopathy and balance is normal  Vital signs in last 24 hours: BP 149/82 mmHg  Ht 5' 2.5" (1.588 m)  Wt 140 lb 14.4 oz (63.912 kg)  BMI 25.34 kg/m2   Labs:   Estimated body mass index is 25.34 kg/(m^2) as calculated from the following:   Height as of this encounter: 5' 2.5" (1.588 m).   Weight as of this encounter: 140 lb 14.4 oz (63.912 kg).   Imaging Review Plain radiographs demonstrate moderate degenerative joint disease of the right knee(s). The overall alignment ismild valgus. The bone quality appears to be good for age and reported activity level.  Comparison: 08/08/2008.   Findings: On the lateral image there is a vague density in the suprapatellar region which may reflect an element of joint effusion.  No fracture or dislocation is evident.  There is osteopenic appearance of the bones.  There is narrowing of the lateral joint space with slight concavity and sclerosis of the lateral tibial plateau unchanged.  Medial and lateral marginal osteophytes are seen.  No opaque loose body is evident.  No chondrocalcinosis is evident.   IMPRESSION: Probable small joint effusion.  Degenerative spurring is seen medially and laterally.  Narrowing of lateral joint space with osteophyte formation with slight concavity and sclerosis of  lateral tibial plateau which appears unchanged.     Original Report Authenticated By: Shanon Brow Call  Assessment/Plan:  End stage arthritis, right knee   The patient history, physical examination, clinical judgment of the provider and imaging studies are consistent with end stage degenerative joint disease of the right knee(s) and total knee arthroplasty is deemed medically necessary. The treatment options including medical management, injection therapy arthroscopy and arthroplasty were discussed at length. The risks and benefits of total knee arthroplasty were presented and reviewed. The risks due to aseptic loosening, infection, stiffness, patella tracking problems, thromboembolic complications and other imponderables were discussed. The patient acknowledged the explanation, agreed to proceed with the plan and consent was signed. Patient is being admitted for inpatient treatment for surgery, pain control, PT, OT, prophylactic antibiotics, VTE prophylaxis, progressive ambulation and ADL's and discharge planning. The patient is planning to be discharged to skilled nursing facility

## 2013-12-25 DIAGNOSIS — J40 Bronchitis, not specified as acute or chronic: Secondary | ICD-10-CM | POA: Diagnosis not present

## 2013-12-25 DIAGNOSIS — J309 Allergic rhinitis, unspecified: Secondary | ICD-10-CM | POA: Diagnosis not present

## 2014-01-07 ENCOUNTER — Telehealth: Payer: Self-pay | Admitting: Orthopedic Surgery

## 2014-01-07 NOTE — Telephone Encounter (Signed)
Patient is calling to cancel surgery for the second time, please advise?

## 2014-01-07 NOTE — Telephone Encounter (Signed)
SPOKE WITH PATIENTS DAUGHTER, SURGERY IS TO STAY SCHEDULED AS PLANNED, SHE WAS REQUESTING A DIFFERENT PRE OP APPT, GAVE SHORT STAY NUMBER TO CALL AND CHANGE

## 2014-01-23 NOTE — Patient Instructions (Signed)
Sandra Williams  01/23/2014   Your procedure is scheduled on:  01/30/2014  Report to [facility] at  700  AM.  Call this number if you have problems the morning of surgery: [phone]   Remember:   Do not eat food or drink liquids after midnight.   Take these medicines the morning of surgery with A SIP OF WATER:  hydrocodone   Do not wear jewelry, make-up or nail polish.  Do not wear lotions, powders, or perfumes.   Do not shave 48 hours prior to surgery. Men may shave face and neck.  Do not bring valuables to the hospital.  [facility] is not responsible for any belongings or valuables.               Contacts, dentures or bridgework may not be worn into surgery.  Leave suitcase in the car. After surgery it may be brought to your room.  For patients admitted to the hospital, discharge time is determined by your treatment team.               Patients discharged the day of surgery will not be allowed to drive home.  Name and phone number of your driver: family  Special Instructions: Shower using CHG 2 nights before surgery and the night before surgery.  If you shower the day of surgery use CHG.  Use special wash - you have one bottle of CHG for all showers.  You should use approximately 1/3 of the bottle for each shower.   Please read over the following fact sheets that you were given: Pain Booklet, Coughing and Deep Breathing, Blood Transfusion Information, Total Joint Packet, MRSA Information, Surgical Site Infection Prevention, Anesthesia Post-op Instructions and Care and Recovery After Surgery Total Knee Replacement Total knee replacement is a procedure to replace your knee joint with an artificial knee joint (prosthetic knee joint). The purpose of this surgery is to reduce pain and improve your knee function. LET [facility] CARE PROVIDER KNOW ABOUT:   Any allergies you have.  All medicines you are taking, including vitamins, herbs, eye drops, creams, and over-the-counter  medicines.  Previous problems you or members of your family have had with the use of anesthetics.  Any blood disorders you have.  Previous surgeries you have had.  Medical conditions you have. RISKS AND COMPLICATIONS  Generally, total knee replacement is a safe procedure. However, problems can occur and include:  Loss of range of motion of the knee or instability.  Loosening of the prosthesis.  Infection.  Persistent pain. BEFORE THE PROCEDURE   Do not eat or drink anything after midnight on the night before the procedure or as directed by your health care provider.  Ask your health care provider about changing or stopping your regular medicines. This is especially important if you are taking diabetes medicines or blood thinners. PROCEDURE   Just before the procedure, you will receive medicine that will make you drowsy (sedative). This will be given through a tube that is inserted into one of your veins (IV tube).  Then you will be given one of the following:  A medicine injected into your spine that numbs your body below the waist (spinal anesthetic).  A medicine that makes you fall asleep (general anesthetic).  You may also receive medicine to block feeling in your leg (nerve block) to help ease pain after surgery.  An incision will be made in your knee. Your surgeon will take out any damaged  cartilage and bone by sawing off the damaged surfaces.  The surgeon will then put a new metal liner over the sawed-off portion of your thigh bone (femur) and a plastic liner over the sawed-off portion of one of the bones of your lower leg (tibia). This is to restore alignment and function to your knee. A plastic piece is often used to restore the surface of your knee cap. AFTER THE PROCEDURE   You will be taken to the recovery area.  You may have drainage tubes to drain excess fluid from your knee. These tubes attach to a device that removes these fluids.  Once you are awake,  stable, and taking fluids well, you will be taken to your hospital room.  You will receive physical therapy as prescribed by your health care provider.  Your surgeon may recommend that you spend time (usually an additional 10-14 days) in an extended-care facility to help you begin walking again and improve your range of motion before you go home.  You may also be prescribed blood-thinning medicine to decrease your risk of developing blood clots in your leg. Document Released: 03/29/2000 Document Revised: 05/07/2013 Document Reviewed: 01/31/2011 [facility] Patient Information 2015 [place], [place]. This information is not intended to replace advice given to you by your health care provider. Make sure you discuss any questions you have with your health care provider. PATIENT INSTRUCTIONS POST-ANESTHESIA  IMMEDIATELY FOLLOWING SURGERY:  Do not drive or operate machinery for the first twenty four hours after surgery.  Do not make any important decisions for twenty four hours after surgery or while taking narcotic pain medications or sedatives.  If you develop intractable nausea and vomiting or a severe headache please notify your doctor immediately.  FOLLOW-UP:  Please make an appointment with your surgeon as instructed. You do not need to follow up with anesthesia unless specifically instructed to do so.  WOUND CARE INSTRUCTIONS (if applicable):  Keep a dry clean dressing on the anesthesia/puncture wound site if there is drainage.  Once the wound has quit draining you may leave it open to air.  Generally you should leave the bandage intact for twenty four hours unless there is drainage.  If the epidural site drains for more than 36-48 hours please call the anesthesia department.  QUESTIONS?:  Please feel free to call your physician or the hospital operator if you have any questions, and they will be happy to assist you.

## 2014-01-24 ENCOUNTER — Other Ambulatory Visit: Payer: Self-pay

## 2014-01-24 ENCOUNTER — Encounter (HOSPITAL_COMMUNITY): Payer: Self-pay

## 2014-01-24 ENCOUNTER — Encounter (HOSPITAL_COMMUNITY)
Admission: RE | Admit: 2014-01-24 | Discharge: 2014-01-24 | Disposition: A | Discharge status: Home / Self Care | Payer: Medicare Other | Source: Ambulatory Visit | Attending: Orthopedic Surgery | Admitting: Orthopedic Surgery

## 2014-01-24 DIAGNOSIS — E785 Hyperlipidemia, unspecified: Secondary | ICD-10-CM | POA: Insufficient documentation

## 2014-01-24 DIAGNOSIS — M1711 Unilateral primary osteoarthritis, right knee: Secondary | ICD-10-CM | POA: Diagnosis not present

## 2014-01-24 DIAGNOSIS — Z01818 Encounter for other preprocedural examination: Secondary | ICD-10-CM | POA: Diagnosis not present

## 2014-01-24 LAB — BASIC METABOLIC PANEL
ANION GAP: 5 (ref 5–15)
BUN: 15 mg/dL (ref 6–23)
CALCIUM: 8.7 mg/dL (ref 8.4–10.5)
CO2: 26 mmol/L (ref 19–32)
Chloride: 110 mEq/L (ref 96–112)
Creatinine, Ser: 0.7 mg/dL (ref 0.50–1.10)
GFR calc Af Amer: 88 mL/min — ABNORMAL LOW (ref >90)
GFR calc non Af Amer: 76 mL/min — ABNORMAL LOW (ref >90)
Glucose, Bld: 86 mg/dL (ref 70–99)
Potassium: 3.3 mmol/L — ABNORMAL LOW (ref 3.5–5.1)
SODIUM: 141 mmol/L (ref 135–145)

## 2014-01-24 LAB — CBC WITH DIFFERENTIAL/PLATELET
Basophils Absolute: 0 10*3/uL (ref 0.0–0.1)
Basophils Relative: 1 % (ref 0–1)
EOS PCT: 5 % (ref 0–5)
Eosinophils Absolute: 0.2 10*3/uL (ref 0.0–0.7)
HEMATOCRIT: 36.8 % (ref 36.0–46.0)
HEMOGLOBIN: 12.1 g/dL (ref 12.0–15.0)
LYMPHS ABS: 1.7 10*3/uL (ref 0.7–4.0)
LYMPHS PCT: 42 % (ref 12–46)
MCH: 29.3 pg (ref 26.0–34.0)
MCHC: 32.9 g/dL (ref 30.0–36.0)
MCV: 89.1 fL (ref 78.0–100.0)
Monocytes Absolute: 0.3 10*3/uL (ref 0.1–1.0)
Monocytes Relative: 7 % (ref 3–12)
NEUTROS ABS: 1.8 10*3/uL (ref 1.7–7.7)
Neutrophils Relative %: 45 % (ref 43–77)
PLATELETS: 228 10*3/uL (ref 150–400)
RBC: 4.13 MIL/uL (ref 3.87–5.11)
RDW: 14.4 % (ref 11.5–15.5)
WBC: 4 10*3/uL (ref 4.0–10.5)

## 2014-01-24 LAB — SURGICAL PCR SCREEN
MRSA, PCR: NEGATIVE
STAPHYLOCOCCUS AUREUS: NEGATIVE

## 2014-01-24 LAB — PROTIME-INR
INR: 1.01 (ref 0.00–1.49)
Prothrombin Time: 13.4 seconds (ref 11.6–15.2)

## 2014-01-24 LAB — APTT: aPTT: 29 seconds (ref 24–37)

## 2014-01-24 NOTE — Pre-Procedure Instructions (Signed)
Patient given information to sign up for my chart at home. 

## 2014-01-25 ENCOUNTER — Inpatient Hospital Stay (HOSPITAL_COMMUNITY)
Admission: RE | Admit: 2014-01-25 | Discharge: 2014-01-25 | Disposition: A | Discharge status: Home / Self Care | Payer: Medicare Other | Source: Ambulatory Visit

## 2014-01-28 ENCOUNTER — Telehealth: Payer: Self-pay | Admitting: Orthopedic Surgery

## 2014-01-28 NOTE — Telephone Encounter (Addendum)
Regarding in-patient surgery scheduled at [facility] 01/30/14, CPT [id number], ICD10  M17.11 -  Per primary insurer, Medicare guidelines, no pre-authorization required; Per secondary insurer, [employer](physician side of [place])- ph [phone], no pre-certification required, per Mickel Baas.  Also contacted [place] directly - reached at same ph#; per Gwenette Greet L, no pre-authorization required. (01/28/14, 3:42pm to 4:01p.m.)

## 2014-01-29 MED ORDER — TRANEXAMIC ACID 100 MG/ML IV SOLN
1000.0000 mg | INTRAVENOUS | Status: AC
  Administered 2014-01-30: 1000 mg via INTRAVENOUS
  Filled 2014-01-29: qty 10

## 2014-01-29 NOTE — OR Nursing (Signed)
Potassium 3.3  Reported to Dr. Patsey Berthold. No further orders needed.

## 2014-01-29 NOTE — H&P (Signed)
TOTAL KNEE ADMISSION H&P  Patient is being admitted for right total knee arthroplasty.  Subjective:  Chief Complaint:right knee pain.  HPI: Sandra Williams, 81 y.o. female, has a history of pain and functional disability in the right knee due to arthritis and has failed non-surgical conservative treatments for greater than 12 weeks to includeNSAID's and/or analgesics, corticosteriod injections, use of assistive devices and activity modification.  Onset of symptoms was gradual, starting >10 years ago with gradually worsening course since that time.   She complains of throbbing 8/10 intermittent right knee cramping and painwhich is improved with walking worse when she is sitting down or getting up from a chair she is intermittent swelling in the right knee she has mild functional deficits but nothing severe.  Review of systems history of fever diarrhea swelling.dizziness but otherwise negative  No allergies  Medications aspirin losartan and pravastatin  History of colorectal cancer with resection. Family history of arthritis and cancer. She is obviously retired does not smoke drink use caffeine.   Right lower extremity she has tenderness over the medial and lateral compartments of the knee with crepitance on range of motion all ligaments are stable strength is intact no skin lesions were noted pulse and temperature are normal without edema. Swelling none. Lymph nodes negative. Sensation normal. Reflexes nonpathologic. Coordination normal.     Patient Active Problem List     Diagnosis  Date Noted   .  Rotator cuff tear arthropathy  12/11/2013   .  Sciatica  10/03/2013   .  Primary osteoarthritis of right knee  10/03/2013   .  GERD (gastroesophageal reflux disease)  10/23/2012   .  Chest pain  10/22/2012   .  OA (osteoarthritis) of knee  06/27/2012   .  Colon cancer  02/01/2012   .  Iron deficiency anemia  02/01/2012   .  MEMORY LOSS  08/15/2008   .  KNEE PAIN, RIGHT  08/12/2008    .  CONSTIPATION  03/01/2008   .  Spinal stenosis of lumbar region  03/01/2008   .  HYPERTENSION, BENIGN ESSENTIAL  01/19/2008   .  SHOULDER PAIN, RIGHT  12/12/2007   .  POSTMENOPAUSAL OSTEOPOROSIS  12/12/2006   .  HYPERLIPIDEMIA  10/07/2006   .  OVERACTIVE BLADDER  10/07/2006   .  ARTHRITIS  10/07/2006    Past Medical History   Diagnosis  Date   .  Osteoporosis     .  Arthritis     .  HTN (hypertension)     .  Hyperlipidemia     .  Cancer         cecal carcinoma   .  GERD (gastroesophageal reflux disease)     .  Colon cancer  02/01/2012       Stage I (T2, N0, M0) cancer the cecum status post resection by Dr. Arnoldo Morale with 14 lymph nodes found all of which were negative. She had no evidence for metastatic disease on CT scan of the abdomen and pelvis either. Her date of surgery was 10/22/2011.  Not in need of chemotherapy for her stage I cancer.    .  Iron deficiency anemia  02/01/2012       At time of colon cancer presentation   .  Knee joint pain  2014       right      Past Surgical History   Procedure  Laterality  Date   .  Abdominal hysterectomy  partial   .  Cataract extraction       .  Cesarean section       .  Dental implant       .  Appendectomy           [facility]   .  Partial colectomy    10/22/2011       Procedure: PARTIAL COLECTOMY;  Surgeon: Jamesetta So, MD;  Location: [facility];  Service: General;  Laterality: N/A;   .  Colon surgery           (Not in a hospital admission) Allergies   Allergen  Reactions   .  Aspirin         REACTION: Upset stomach if takes regular and uncoated       History   Substance Use Topics   .  Smoking status:  Never Smoker    .  Smokeless tobacco:  Never Used   .  Alcohol Use:  No     Family History   Problem  Relation  Age of Onset   .  Breast cancer  Daughter     .  Hypertension  Daughter     .  Colon cancer  Neg Hx     .  Liver disease  Neg Hx     .  Hypertension  Mother     .  Cancer  Sister          ROS  Objective:  Physical Exam  BP 149/82 mmHg  Ht 5' 2.5" (1.588 m)  Wt 140 lb 14.4 oz (63.912 kg)  BMI 25.34 kg/m2  BP 142/70  Ht 5\' 6"  (1.676 m)  Wt 140 lb (63.504 kg)  BMI 22.61 kg/m2 General appearance is normal, the patient is alert and oriented x3 with normal mood and affect. Upper extremity exam  The right and left upper extremity:     Inspection and palpation revealed no abnormalities in the upper extremities.    Range of motion is full without contracture.  Motor exam is normal with grade 5 strength.  The joints are fully reduced without subluxation.  There is no atrophy or tremor and muscle tone is normal.  All joints are stable.   Left lower extremity no tenderness or swelling full range of motion normal stability tests normal strength normal skin normal pulses normal sensation bilateral reflexes normal no lymphadenopathy and balance is normal  Vital signs in last 24 hours: BP 149/82 mmHg  Ht 5' 2.5" (1.588 m)  Wt 140 lb 14.4 oz (63.912 kg)  BMI 25.34 kg/m2   Labs:   Estimated body mass index is 25.34 kg/(m^2) as calculated from the following:   Height as of this encounter: 5' 2.5" (1.588 m).   Weight as of this encounter: 140 lb 14.4 oz (63.912 kg).   Imaging Review Plain radiographs demonstrate moderate degenerative joint disease of the right knee(s). The overall alignment ismild valgus. The bone quality appears to be good for age and reported activity level.  Comparison: 08/08/2008.   Findings: On the lateral image there is a vague density in the suprapatellar region which may reflect an element of joint effusion.  No fracture or dislocation is evident.  There is osteopenic appearance of the bones.  There is narrowing of the lateral joint space with slight concavity and sclerosis of the lateral tibial plateau unchanged.  Medial and lateral marginal osteophytes are seen.  No opaque loose body is evident.  No chondrocalcinosis is evident.  IMPRESSION: Probable small joint effusion.  Degenerative spurring is seen medially and laterally.  Narrowing of lateral joint space with osteophyte formation with slight concavity and sclerosis of lateral tibial plateau which appears unchanged.     Original Report Authenticated By: Shanon Brow Call  Assessment/Plan:  End stage arthritis, right knee   The patient history, physical examination, clinical judgment of the provider and imaging studies are consistent with end stage degenerative joint disease of the right knee(s) and total knee arthroplasty is deemed medically necessary. The treatment options including medical management, injection therapy arthroscopy and arthroplasty were discussed at length. The risks and benefits of total knee arthroplasty were presented and reviewed. The risks due to aseptic loosening, infection, stiffness, patella tracking problems, thromboembolic complications and other imponderables were discussed. The patient acknowledged the explanation, agreed to proceed with the plan and consent was signed. Patient is being admitted for inpatient treatment for surgery, pain control, PT, OT, prophylactic antibiotics, VTE prophylaxis, progressive ambulation and ADL's and discharge planning. The patient is planning to be discharged to skilled nursing facility  [facility]   Findings: On the lateral image there is a vague density in the suprapatellar region which may reflect an element of joint effusion.  No fracture or dislocation is evident.  There is osteopenic appearance of the bones.  There is narrowing of the lateral joint space with slight concavity and sclerosis of the lateral tibial plateau unchanged.  Medial and lateral marginal osteophytes are seen.  No opaque loose body is evident.  No chondrocalcinosis is evident.   IMPRESSION: Probable small joint effusion.  Degenerative spurring is seen medially and laterally.  Narrowing of lateral joint space  with osteophyte formation with slight concavity and sclerosis of lateral tibial plateau which appears unchanged.     Original Report Authenticated By: Shanon Brow Call

## 2014-01-30 ENCOUNTER — Inpatient Hospital Stay (HOSPITAL_COMMUNITY): Payer: Medicare Other | Admitting: Anesthesiology

## 2014-01-30 ENCOUNTER — Encounter (HOSPITAL_COMMUNITY): Payer: Self-pay | Admitting: *Deleted

## 2014-01-30 ENCOUNTER — Encounter (HOSPITAL_COMMUNITY)
Admission: RE | Disposition: A | Discharge status: Skilled Nursing Facility | Payer: Self-pay | Source: Ambulatory Visit | Attending: Orthopedic Surgery

## 2014-01-30 ENCOUNTER — Inpatient Hospital Stay (HOSPITAL_COMMUNITY): Payer: Medicare Other

## 2014-01-30 ENCOUNTER — Inpatient Hospital Stay (HOSPITAL_COMMUNITY)
Admission: RE | Admit: 2014-01-30 | Discharge: 2014-02-02 | DRG: 470 | Disposition: A | Discharge status: Skilled Nursing Facility | Payer: Medicare Other | Source: Ambulatory Visit | Attending: Orthopedic Surgery | Admitting: Orthopedic Surgery

## 2014-01-30 DIAGNOSIS — Z471 Aftercare following joint replacement surgery: Secondary | ICD-10-CM | POA: Diagnosis not present

## 2014-01-30 DIAGNOSIS — Z96651 Presence of right artificial knee joint: Secondary | ICD-10-CM | POA: Diagnosis not present

## 2014-01-30 DIAGNOSIS — Z8261 Family history of arthritis: Secondary | ICD-10-CM | POA: Diagnosis not present

## 2014-01-30 DIAGNOSIS — Z85038 Personal history of other malignant neoplasm of large intestine: Secondary | ICD-10-CM | POA: Diagnosis not present

## 2014-01-30 DIAGNOSIS — Z85048 Personal history of other malignant neoplasm of rectum, rectosigmoid junction, and anus: Secondary | ICD-10-CM

## 2014-01-30 DIAGNOSIS — M810 Age-related osteoporosis without current pathological fracture: Secondary | ICD-10-CM | POA: Diagnosis present

## 2014-01-30 DIAGNOSIS — Z803 Family history of malignant neoplasm of breast: Secondary | ICD-10-CM

## 2014-01-30 DIAGNOSIS — Z8249 Family history of ischemic heart disease and other diseases of the circulatory system: Secondary | ICD-10-CM

## 2014-01-30 DIAGNOSIS — M1711 Unilateral primary osteoarthritis, right knee: Secondary | ICD-10-CM | POA: Diagnosis present

## 2014-01-30 DIAGNOSIS — M4806 Spinal stenosis, lumbar region: Secondary | ICD-10-CM | POA: Diagnosis not present

## 2014-01-30 DIAGNOSIS — K219 Gastro-esophageal reflux disease without esophagitis: Secondary | ICD-10-CM | POA: Diagnosis present

## 2014-01-30 DIAGNOSIS — R262 Difficulty in walking, not elsewhere classified: Secondary | ICD-10-CM | POA: Diagnosis not present

## 2014-01-30 DIAGNOSIS — Z9660 Presence of unspecified orthopedic joint implant: Secondary | ICD-10-CM | POA: Diagnosis not present

## 2014-01-30 DIAGNOSIS — I10 Essential (primary) hypertension: Secondary | ICD-10-CM | POA: Diagnosis present

## 2014-01-30 DIAGNOSIS — M1990 Unspecified osteoarthritis, unspecified site: Secondary | ICD-10-CM | POA: Insufficient documentation

## 2014-01-30 DIAGNOSIS — R278 Other lack of coordination: Secondary | ICD-10-CM | POA: Diagnosis not present

## 2014-01-30 DIAGNOSIS — R488 Other symbolic dysfunctions: Secondary | ICD-10-CM | POA: Diagnosis not present

## 2014-01-30 DIAGNOSIS — E785 Hyperlipidemia, unspecified: Secondary | ICD-10-CM | POA: Diagnosis present

## 2014-01-30 DIAGNOSIS — M6281 Muscle weakness (generalized): Secondary | ICD-10-CM | POA: Diagnosis not present

## 2014-01-30 DIAGNOSIS — Z9049 Acquired absence of other specified parts of digestive tract: Secondary | ICD-10-CM | POA: Diagnosis present

## 2014-01-30 DIAGNOSIS — M25561 Pain in right knee: Secondary | ICD-10-CM | POA: Diagnosis not present

## 2014-01-30 DIAGNOSIS — K5900 Constipation, unspecified: Secondary | ICD-10-CM | POA: Diagnosis not present

## 2014-01-30 HISTORY — PX: TOTAL KNEE ARTHROPLASTY: SHX125

## 2014-01-30 SURGERY — ARTHROPLASTY, KNEE, TOTAL
Anesthesia: Spinal | Site: Knee | Laterality: Right

## 2014-01-30 MED ORDER — SUCCINYLCHOLINE CHLORIDE 20 MG/ML IJ SOLN
INTRAMUSCULAR | Status: AC
  Filled 2014-01-30: qty 1

## 2014-01-30 MED ORDER — WARFARIN VIDEO
Freq: Once | Status: AC
  Administered 2014-01-30: 1

## 2014-01-30 MED ORDER — GLYCOPYRROLATE 0.2 MG/ML IJ SOLN
INTRAMUSCULAR | Status: DC | PRN
  Administered 2014-01-30: 0.2 mg via INTRAVENOUS

## 2014-01-30 MED ORDER — FENTANYL CITRATE 0.05 MG/ML IJ SOLN
INTRAMUSCULAR | Status: DC | PRN
  Administered 2014-01-30: 12.5 ug via INTRAVENOUS
  Administered 2014-01-30: 30 ug via INTRAVENOUS
  Administered 2014-01-30: 20 ug via INTRATHECAL
  Administered 2014-01-30 (×3): 12.5 ug via INTRAVENOUS

## 2014-01-30 MED ORDER — BUPIVACAINE IN DEXTROSE 0.75-8.25 % IT SOLN
INTRATHECAL | Status: AC
  Filled 2014-01-30: qty 2

## 2014-01-30 MED ORDER — METOCLOPRAMIDE HCL 5 MG/ML IJ SOLN: 5.0000 mg | Freq: Three times a day (TID) | INTRAMUSCULAR | Status: DC | PRN

## 2014-01-30 MED ORDER — LACTATED RINGERS IV SOLN
INTRAVENOUS | Status: DC | PRN
  Administered 2014-01-30 (×2): via INTRAVENOUS

## 2014-01-30 MED ORDER — SODIUM CHLORIDE 0.9 % IR SOLN
Status: DC | PRN
  Administered 2014-01-30: 3000 mL

## 2014-01-30 MED ORDER — PRAVASTATIN SODIUM 40 MG PO TABS
40.0000 mg | ORAL_TABLET | Freq: Every evening | ORAL | Status: DC
  Administered 2014-01-30 – 2014-02-01 (×3): 40 mg via ORAL
  Filled 2014-01-30 (×3): qty 1

## 2014-01-30 MED ORDER — FENTANYL CITRATE 0.05 MG/ML IJ SOLN: 25.0000 ug | INTRAMUSCULAR | Status: DC | PRN

## 2014-01-30 MED ORDER — LACTATED RINGERS IV SOLN
INTRAVENOUS | Status: DC
  Administered 2014-01-30: 08:00:00 via INTRAVENOUS

## 2014-01-30 MED ORDER — PROPOFOL 10 MG/ML IV BOLUS
INTRAVENOUS | Status: AC
Start: 2014-01-30 — End: 2014-01-30
  Filled 2014-01-30: qty 20

## 2014-01-30 MED ORDER — PROPOFOL INFUSION 10 MG/ML OPTIME
INTRAVENOUS | Status: DC | PRN
  Administered 2014-01-30: 75 ug/kg/min via INTRAVENOUS

## 2014-01-30 MED ORDER — METOCLOPRAMIDE HCL 10 MG PO TABS: 5.0000 mg | ORAL_TABLET | Freq: Three times a day (TID) | ORAL | Status: DC | PRN

## 2014-01-30 MED ORDER — METHOCARBAMOL 500 MG PO TABS
500.0000 mg | ORAL_TABLET | Freq: Four times a day (QID) | ORAL | Status: DC | PRN
  Administered 2014-01-30: 500 mg via ORAL
  Filled 2014-01-30: qty 1

## 2014-01-30 MED ORDER — METHOCARBAMOL 1000 MG/10ML IJ SOLN
500.0000 mg | Freq: Four times a day (QID) | INTRAVENOUS | Status: DC | PRN
  Filled 2014-01-30: qty 5

## 2014-01-30 MED ORDER — MAGNESIUM CITRATE PO SOLN: 1.0000 | Freq: Once | ORAL | Status: AC | PRN

## 2014-01-30 MED ORDER — DIPHENHYDRAMINE HCL 12.5 MG/5ML PO ELIX: 12.5000 mg | ORAL_SOLUTION | ORAL | Status: DC | PRN

## 2014-01-30 MED ORDER — LIDOCAINE HCL (PF) 1 % IJ SOLN
INTRAMUSCULAR | Status: AC
  Filled 2014-01-30: qty 5

## 2014-01-30 MED ORDER — SODIUM CHLORIDE 0.9 % IR SOLN
Status: DC | PRN
  Administered 2014-01-30: 1000 mL

## 2014-01-30 MED ORDER — MORPHINE SULFATE 4 MG/ML IJ SOLN
4.0000 mg | INTRAMUSCULAR | Status: DC | PRN
  Administered 2014-01-30 – 2014-01-31 (×5): 4 mg via INTRAVENOUS
  Filled 2014-01-30 (×5): qty 1

## 2014-01-30 MED ORDER — PHENYLEPHRINE HCL 10 MG/ML IJ SOLN
INTRAMUSCULAR | Status: AC
  Filled 2014-01-30: qty 1

## 2014-01-30 MED ORDER — CHLORHEXIDINE GLUCONATE 4 % EX LIQD: 60.0000 mL | Freq: Once | CUTANEOUS | Status: DC

## 2014-01-30 MED ORDER — EPHEDRINE SULFATE 50 MG/ML IJ SOLN
INTRAMUSCULAR | Status: AC
  Filled 2014-01-30: qty 1

## 2014-01-30 MED ORDER — FENTANYL CITRATE 0.05 MG/ML IJ SOLN
INTRAMUSCULAR | Status: AC
  Filled 2014-01-30: qty 2

## 2014-01-30 MED ORDER — BUPIVACAINE-EPINEPHRINE (PF) 0.5% -1:200000 IJ SOLN
INTRAMUSCULAR | Status: DC | PRN
  Administered 2014-01-30: 60 mL via PERINEURAL

## 2014-01-30 MED ORDER — WARFARIN - PHYSICIAN DOSING INPATIENT
Freq: Every day | Status: DC
  Administered 2014-02-01: 18:00:00

## 2014-01-30 MED ORDER — BUPIVACAINE IN DEXTROSE 0.75-8.25 % IT SOLN
INTRATHECAL | Status: DC | PRN
  Administered 2014-01-30: 13 mg via INTRATHECAL

## 2014-01-30 MED ORDER — CEFAZOLIN SODIUM-DEXTROSE 2-3 GM-% IV SOLR
2.0000 g | INTRAVENOUS | Status: AC
  Administered 2014-01-30: 2 g via INTRAVENOUS
  Filled 2014-01-30: qty 50

## 2014-01-30 MED ORDER — CEFAZOLIN SODIUM 1-5 GM-% IV SOLN
1.0000 g | Freq: Four times a day (QID) | INTRAVENOUS | Status: AC
  Administered 2014-01-30 (×2): 1 g via INTRAVENOUS
  Filled 2014-01-30 (×2): qty 50

## 2014-01-30 MED ORDER — PROPOFOL 10 MG/ML IV BOLUS
INTRAVENOUS | Status: AC
  Filled 2014-01-30: qty 20

## 2014-01-30 MED ORDER — ARTIFICIAL TEARS OP OINT
TOPICAL_OINTMENT | OPHTHALMIC | Status: AC
  Filled 2014-01-30: qty 3.5

## 2014-01-30 MED ORDER — BISACODYL 10 MG RE SUPP
10.0000 mg | Freq: Every day | RECTAL | Status: DC | PRN
  Administered 2014-02-01: 10 mg via RECTAL
  Filled 2014-01-30: qty 1

## 2014-01-30 MED ORDER — ALUM & MAG HYDROXIDE-SIMETH 200-200-20 MG/5ML PO SUSP: 30.0000 mL | ORAL | Status: DC | PRN

## 2014-01-30 MED ORDER — MIDAZOLAM HCL 2 MG/2ML IJ SOLN
1.0000 mg | INTRAMUSCULAR | Status: DC | PRN
  Administered 2014-01-30: 2 mg via INTRAVENOUS

## 2014-01-30 MED ORDER — PHENOL 1.4 % MT LIQD: 1.0000 | OROMUCOSAL | Status: DC | PRN

## 2014-01-30 MED ORDER — LIDOCAINE HCL (CARDIAC) 10 MG/ML IV SOLN
INTRAVENOUS | Status: DC | PRN
  Administered 2014-01-30: 25 mg via INTRAVENOUS

## 2014-01-30 MED ORDER — SENNA 8.6 MG PO TABS
1.0000 | ORAL_TABLET | Freq: Two times a day (BID) | ORAL | Status: DC
  Administered 2014-01-30 – 2014-02-01 (×5): 8.6 mg via ORAL
  Filled 2014-01-30 (×6): qty 1

## 2014-01-30 MED ORDER — POLYETHYLENE GLYCOL 3350 17 G PO PACK
17.0000 g | PACK | Freq: Every day | ORAL | Status: DC
  Administered 2014-01-30 – 2014-02-01 (×3): 17 g via ORAL
  Filled 2014-01-30 (×3): qty 1

## 2014-01-30 MED ORDER — FENTANYL CITRATE 0.05 MG/ML IJ SOLN
25.0000 ug | INTRAMUSCULAR | Status: AC
  Administered 2014-01-30 (×2): 25 ug via INTRAVENOUS

## 2014-01-30 MED ORDER — BUPIVACAINE-EPINEPHRINE (PF) 0.5% -1:200000 IJ SOLN
INTRAMUSCULAR | Status: AC
  Filled 2014-01-30: qty 90

## 2014-01-30 MED ORDER — SODIUM CHLORIDE 0.9 % IV SOLN: INTRAVENOUS | Status: DC

## 2014-01-30 MED ORDER — ONDANSETRON HCL 4 MG/2ML IJ SOLN
4.0000 mg | Freq: Once | INTRAMUSCULAR | Status: DC | PRN
  Filled 2014-01-30: qty 2

## 2014-01-30 MED ORDER — MENTHOL 3 MG MT LOZG: 1.0000 | LOZENGE | OROMUCOSAL | Status: DC | PRN

## 2014-01-30 MED ORDER — SODIUM CHLORIDE 0.9 % IV SOLN
INTRAVENOUS | Status: DC
  Administered 2014-01-30 – 2014-02-02 (×3): via INTRAVENOUS

## 2014-01-30 MED ORDER — WARFARIN SODIUM 5 MG PO TABS
2.5000 mg | ORAL_TABLET | Freq: Every day | ORAL | Status: AC
  Administered 2014-01-30: 2.5 mg via ORAL
  Filled 2014-01-30: qty 1

## 2014-01-30 MED ORDER — MIDAZOLAM HCL 2 MG/2ML IJ SOLN
INTRAMUSCULAR | Status: AC
  Filled 2014-01-30: qty 2

## 2014-01-30 MED ORDER — SODIUM CHLORIDE 0.9 % IJ SOLN
INTRAMUSCULAR | Status: AC
  Filled 2014-01-30: qty 30

## 2014-01-30 MED ORDER — ADULT MULTIVITAMIN W/MINERALS CH
1.0000 | ORAL_TABLET | Freq: Every day | ORAL | Status: DC
  Administered 2014-01-30 – 2014-02-02 (×4): 1 via ORAL
  Filled 2014-01-30 (×4): qty 1

## 2014-01-30 MED ORDER — ONDANSETRON HCL 4 MG PO TABS: 4.0000 mg | ORAL_TABLET | Freq: Four times a day (QID) | ORAL | Status: DC | PRN

## 2014-01-30 MED ORDER — ONDANSETRON HCL 4 MG/2ML IJ SOLN
4.0000 mg | Freq: Four times a day (QID) | INTRAMUSCULAR | Status: DC | PRN
  Administered 2014-02-01: 4 mg via INTRAVENOUS
  Filled 2014-01-30: qty 2

## 2014-01-30 MED ORDER — EPHEDRINE SULFATE 50 MG/ML IJ SOLN
INTRAMUSCULAR | Status: DC | PRN
  Administered 2014-01-30 (×2): 5 mg via INTRAVENOUS

## 2014-01-30 MED ORDER — GLYCOPYRROLATE 0.2 MG/ML IJ SOLN
INTRAMUSCULAR | Status: AC
  Filled 2014-01-30: qty 1

## 2014-01-30 MED ORDER — LOSARTAN POTASSIUM 50 MG PO TABS
100.0000 mg | ORAL_TABLET | Freq: Every morning | ORAL | Status: DC
  Administered 2014-01-30 – 2014-02-02 (×4): 100 mg via ORAL
  Filled 2014-01-30 (×4): qty 2

## 2014-01-30 MED ORDER — DOCUSATE SODIUM 100 MG PO CAPS
100.0000 mg | ORAL_CAPSULE | Freq: Two times a day (BID) | ORAL | Status: DC
  Administered 2014-01-30 – 2014-02-02 (×6): 100 mg via ORAL
  Filled 2014-01-30 (×7): qty 1

## 2014-01-30 MED ORDER — ONDANSETRON HCL 4 MG/2ML IJ SOLN
INTRAMUSCULAR | Status: AC
  Filled 2014-01-30: qty 2

## 2014-01-30 MED ORDER — COUMADIN BOOK
Freq: Once | Status: AC
  Administered 2014-01-30: 1
  Filled 2014-01-30: qty 1

## 2014-01-30 MED ORDER — HYDROCODONE-ACETAMINOPHEN 5-325 MG PO TABS
1.0000 | ORAL_TABLET | Freq: Once | ORAL | Status: AC
Start: 2014-01-30 — End: 2014-01-30
  Administered 2014-01-30: 1 via ORAL
  Filled 2014-01-30: qty 1

## 2014-01-30 MED ORDER — ONDANSETRON HCL 4 MG/2ML IJ SOLN
4.0000 mg | Freq: Once | INTRAMUSCULAR | Status: AC
  Administered 2014-01-30: 4 mg via INTRAVENOUS

## 2014-01-30 SURGICAL SUPPLY — 70 items
BAG HAMPER (MISCELLANEOUS) ×3 IMPLANT
BANDAGE ESMARK 6X9 LF (GAUZE/BANDAGES/DRESSINGS) ×1 IMPLANT
BIT DRILL 3.2X128 (BIT) IMPLANT
BIT DRILL 3.2X128MM (BIT)
BLADE HEX COATED 2.75 (ELECTRODE) ×3 IMPLANT
BLADE SAG 18X100X1.27 (BLADE) IMPLANT
BLADE SAGITTAL 25.0X1.27X90 (BLADE) IMPLANT
BLADE SAGITTAL 25.0X1.27X90MM (BLADE)
BLADE SAW SAG 90X13X1.27 (BLADE) IMPLANT
BNDG ESMARK 6X9 LF (GAUZE/BANDAGES/DRESSINGS) ×3
BOWL SMART MIX CTS (DISPOSABLE) IMPLANT
CAP KNEE TOTAL 3 SIGMA ×3 IMPLANT
CEMENT HV SMART SET (Cement) ×6 IMPLANT
CLOTH BEACON ORANGE TIMEOUT ST (SAFETY) ×3 IMPLANT
COVER LIGHT HANDLE STERIS (MISCELLANEOUS) ×12 IMPLANT
COVER PROBE W GEL 5X96 (DRAPES) ×3 IMPLANT
CUFF TOURNIQUET SINGLE 34IN LL (TOURNIQUET CUFF) ×3 IMPLANT
CUFF TOURNIQUET SINGLE 44IN (TOURNIQUET CUFF) IMPLANT
DECANTER SPIKE VIAL GLASS SM (MISCELLANEOUS) ×6 IMPLANT
DRAPE BACK TABLE (DRAPES) ×3 IMPLANT
DRAPE EXTREMITY T 121X128X90 (DRAPE) ×3 IMPLANT
DRSG MEPILEX BORDER 4X12 (GAUZE/BANDAGES/DRESSINGS) ×3 IMPLANT
DURAPREP 26ML APPLICATOR (WOUND CARE) ×6 IMPLANT
ELECT REM PT RETURN 9FT ADLT (ELECTROSURGICAL) ×3
ELECTRODE REM PT RTRN 9FT ADLT (ELECTROSURGICAL) ×1 IMPLANT
EVACUATOR 3/16  PVC DRAIN (DRAIN) ×2
EVACUATOR 3/16 PVC DRAIN (DRAIN) ×1 IMPLANT
FACESHIELD LNG OPTICON STERILE (SAFETY) IMPLANT
GLOVE BIOGEL M 7.0 STRL (GLOVE) ×9 IMPLANT
GLOVE BIOGEL PI IND STRL 7.0 (GLOVE) ×4 IMPLANT
GLOVE BIOGEL PI INDICATOR 7.0 (GLOVE) ×8
GLOVE ECLIPSE 6.5 STRL STRAW (GLOVE) ×3 IMPLANT
GLOVE OPTIFIT SS 8.0 STRL (GLOVE) ×3 IMPLANT
GLOVE SKINSENSE NS SZ8.0 LF (GLOVE) ×2
GLOVE SKINSENSE STRL SZ8.0 LF (GLOVE) ×1 IMPLANT
GLOVE SS N UNI LF 8.5 STRL (GLOVE) ×3 IMPLANT
GOWN STRL REUS W/TWL LRG LVL3 (GOWN DISPOSABLE) ×12 IMPLANT
GOWN STRL REUS W/TWL XL LVL3 (GOWN DISPOSABLE) ×3 IMPLANT
HANDPIECE INTERPULSE COAX TIP (DISPOSABLE) ×2
HOOD W/PEELAWAY (MISCELLANEOUS) ×12 IMPLANT
INST SET MAJOR BONE (KITS) ×3 IMPLANT
IV NS IRRIG 3000ML ARTHROMATIC (IV SOLUTION) ×3 IMPLANT
KIT BLADEGUARD II DBL (SET/KITS/TRAYS/PACK) ×3 IMPLANT
KIT ROOM TURNOVER APOR (KITS) ×3 IMPLANT
MANIFOLD NEPTUNE II (INSTRUMENTS) ×3 IMPLANT
MARKER SKIN DUAL TIP RULER LAB (MISCELLANEOUS) ×3 IMPLANT
NEEDLE HYPO 21X1.5 SAFETY (NEEDLE) ×3 IMPLANT
NEEDLE HYPO 25X1 1.5 SAFETY (NEEDLE) ×3 IMPLANT
NS IRRIG 1000ML POUR BTL (IV SOLUTION) ×3 IMPLANT
PACK TOTAL JOINT (CUSTOM PROCEDURE TRAY) ×3 IMPLANT
PAD ARMBOARD 7.5X6 YLW CONV (MISCELLANEOUS) ×3 IMPLANT
PAD DANNIFLEX CPM (ORTHOPEDIC SUPPLIES) ×3 IMPLANT
PIN TROCAR 3 INCH (PIN) ×3 IMPLANT
SAW OSC TIP CART 19.5X105X1.3 (SAW) ×3 IMPLANT
SET BASIN LINEN APH (SET/KITS/TRAYS/PACK) ×3 IMPLANT
SET HNDPC FAN SPRY TIP SCT (DISPOSABLE) ×1 IMPLANT
SPONGE DRAIN TRACH 4X4 STRL 2S (GAUZE/BANDAGES/DRESSINGS) ×3 IMPLANT
STAPLER VISISTAT 35W (STAPLE) ×3 IMPLANT
SUT BRALON NAB BRD #1 30IN (SUTURE) ×6 IMPLANT
SUT MON AB 0 CT1 (SUTURE) ×6 IMPLANT
SUT MON AB 2-0 CT1 36 (SUTURE) IMPLANT
SYR 20CC LL (SYRINGE) IMPLANT
SYR 30ML LL (SYRINGE) ×3 IMPLANT
SYR BULB IRRIGATION 50ML (SYRINGE) ×3 IMPLANT
TAPE CLOTH SURG 4X10 WHT LF (GAUZE/BANDAGES/DRESSINGS) ×3 IMPLANT
TOWEL OR 17X26 4PK STRL BLUE (TOWEL DISPOSABLE) ×3 IMPLANT
TOWER CARTRIDGE SMART MIX (DISPOSABLE) ×3 IMPLANT
TRAY FOLEY CATH 16FR SILVER (SET/KITS/TRAYS/PACK) ×3 IMPLANT
WATER STERILE IRR 1000ML POUR (IV SOLUTION) ×12 IMPLANT
YANKAUER SUCT 12FT TUBE ARGYLE (SUCTIONS) ×3 IMPLANT

## 2014-01-30 NOTE — Anesthesia Postprocedure Evaluation (Signed)
  Anesthesia Post-op Note  Patient: Sandra Williams  Procedure(s) Performed: Procedure(s): TOTAL KNEE ARTHROPLASTY (Right)  Patient Location: PACU  Anesthesia Type:Spinal  Level of Consciousness: awake, alert , oriented and patient cooperative  Airway and Oxygen Therapy: Patient Spontanous Breathing and Patient connected to nasal cannula oxygen  Post-op Pain: none  Post-op Assessment: Post-op Vital signs reviewed, Patient's Cardiovascular Status Stable, Respiratory Function Stable, Patent Airway, No signs of Nausea or vomiting, Pain level controlled and No headache  Post-op Vital Signs: Reviewed and stable  Last Vitals:  Filed Vitals:   01/30/14 1145  BP: 136/46  Pulse: 52  Temp:   Resp: 10    Complications: No apparent anesthesia complications

## 2014-01-30 NOTE — Interval H&P Note (Signed)
History and Physical Interval Note:  01/30/2014 8:07 AM  Sandra Williams  has presented today for surgery, with the diagnosis of osteoarthritis right knee  The various methods of treatment have been discussed with the patient and family. After consideration of risks, benefits and other options for treatment, the patient has consented to  Procedure(s): TOTAL KNEE ARTHROPLASTY (Right) as a surgical intervention .  The patient's history has been reviewed, patient examined, no change in status, stable for surgery.  I have reviewed the patient's chart and labs.  Questions were answered to the patient's satisfaction.     Arther Abbott

## 2014-01-30 NOTE — Evaluation (Signed)
Physical Therapy Evaluation Patient Details Name: Sandra Williams MRN: [id number] DOB: Aug 07, 1925 Today's Date: 01/30/2014   History of Present Illness  [name], 81 y.o. female, has a history of pain and functional disability in the right knee due to arthritis and has failed non-surgical conservative treatments for greater than 12 weeks to includeNSAID's and/or analgesics, corticosteriod injections, use of assistive devices and activity modification.  Onset of symptoms was gradual, starting >10 years ago with gradually worsening course since that time.  Pt underwent Rt TKA on 01/30/14; MD orders for WBAT.   Clinical Impression  Pt is an 81 year old female who presents to PT s/p Rt TKA on 01/30/14; MD orders for WBAT.  Pt currently reports no complaints of pain, and no complaints of pain by end of treatment session.   Pt alternates living arrangements with sister/nephew and daughter; pt will have 24/7 assist at home if needed.  During evaluation, pt was min guard/assist with bed mobility skills, min guard for transfers and gait with use of RW.  VC throughout assessment for techniques with use of RW.  Pt demonstrated slight step through gait pattern, though decreased step length of Lt secondary to recent surgical procedure on Rt side.  Recommend continued PT while in the hospital to address ROM, strength, balance, activity tolerance, and appropriate use of DME.  HHPT vs. SNF pending; pt did excellent with session today secondary to no complaints of pain, though will assess mobility skills in next couple of treatment sessions for d/c recommendation depending on pain level and progress with PT.      Follow Up Recommendations Other (comment) (Unsure, HHPT vs. SNF, will update recommendations in next couple days)    Equipment Recommendations  Other (comment) (If HHPT, pt will need RW. If SNF, will refer to SNF)       Precautions / Restrictions Precautions Precautions:  Fall Restrictions Weight Bearing Restrictions: Yes RLE Weight Bearing: Weight bearing as tolerated      Mobility  Bed Mobility Overal bed mobility: Needs Assistance Bed Mobility: Supine to Sit;Sit to Supine     Supine to sit: Min guard (for Rt LE) Sit to supine: Min assist (for Rt LE)      Transfers Overall transfer level: Needs assistance Equipment used: Rolling walker (2 wheeled) Transfers: Sit to/from Stand Sit to Stand: Min guard         General transfer comment: Continuous VC for technique for hand/foot placement with use of RW  Ambulation/Gait Ambulation/Gait assistance: Min guard Ambulation Distance (Feet): 20 Feet Assistive device: Rolling walker (2 wheeled) Gait Pattern/deviations: Step-through pattern;Decreased stance time - left;Decreased weight shift to right;Decreased dorsiflexion - right   Gait velocity interpretation: Below normal speed for age/gender       Balance Overall balance assessment: Needs assistance Sitting-balance support: Bilateral upper extremity supported;Feet unsupported Sitting balance-Leahy Scale: Good     Standing balance support: Bilateral upper extremity supported;During functional activity Standing balance-Leahy Scale: Fair                               Pertinent Vitals/Pain Pain Assessment: No/denies pain    Home Living Family/patient expects to be discharged to:: Unsure Living Arrangements: Other relatives;Children (Alternates living with sister/nephew and daughter) Available Help at Discharge: Family;Available 24 hours/day Type of Home: House Home Access: Stairs to enter Entrance Stairs-Rails: Can reach both Entrance Stairs-Number of Steps: 3 Home Layout:  (1 step to bathroom) Home Equipment: Grab bars -  tub/shower Additional Comments: Tub Shower    Prior Function Level of Independence: Independent                  Extremity/Trunk Assessment               Lower Extremity Assessment:  RLE deficits/detail RLE Deficits / Details: Knee flexion 60 degrees       Communication   Communication: No difficulties  Cognition Arousal/Alertness: Awake/alert Behavior During Therapy: WFL for tasks assessed/performed Overall Cognitive Status: Within Functional Limits for tasks assessed                         Exercises General Exercises - Lower Extremity Ankle Circles/Pumps: AROM;Both;10 reps;Supine Quad Sets: Strengthening;Both;5 reps;Supine Heel Slides: AROM;AAROM;Both;5 reps;Supine (AROM Lt, AAROM Rt)      Assessment/Plan    PT Assessment Patient needs continued PT services  PT Diagnosis Abnormality of gait;Generalized weakness   PT Problem List Decreased strength;Decreased range of motion;Decreased activity tolerance;Decreased balance;Decreased mobility;Decreased knowledge of precautions;Decreased safety awareness  PT Treatment Interventions DME instruction;Balance training;Gait training;Neuromuscular re-education;Stair training;Functional mobility training;Therapeutic activities;Therapeutic exercise;Patient/family education;Manual techniques   PT Goals (Current goals can be found in the Care Plan section) Acute Rehab PT Goals Patient Stated Goal: improve function PT Goal Formulation: With patient/family Time For Goal Achievement: 02/06/14 Potential to Achieve Goals: Good    Frequency 7X/week    End of Session Equipment Utilized During Treatment: Gait belt Activity Tolerance: Patient tolerated treatment well Patient left: in bed;with call bell/phone within reach;with bed alarm set Nurse Communication: Mobility status    Functional Assessment Tool Used: Clinical Judgement Functional Limitation: Mobility: Walking and moving around Mobility: Walking and Moving Around Current Status (Z6109): At least 20 percent but less than 40 percent impaired, limited or restricted Mobility: Walking and Moving Around Goal Status [phone]): At least 1 percent but less than 20  percent impaired, limited or restricted    Time: 1235-1315 PT Time Calculation (min) (ACUTE ONLY): 40 min   Charges:   PT Evaluation $Initial PT Evaluation Tier I: 1 Procedure PT Treatments $Self Care/Home Management: 8-22 (Techniques for bed mobility/transfers/gait secondary to use, use of RW, HHPT vs. SNF, HEP)   PT G Codes:   PT G-Codes **NOT FOR INPATIENT CLASS** Functional Assessment Tool Used: Clinical Judgement Functional Limitation: Mobility: Walking and moving around Mobility: Walking and Moving Around Current Status (U9811): At least 20 percent but less than 40 percent impaired, limited or restricted Mobility: Walking and Moving Around Goal Status [phone]): At least 1 percent but less than 20 percent impaired, limited or restricted    Lonna Cobb, DPT [phone]  01/30/2014, 1:27 PM

## 2014-01-30 NOTE — Anesthesia Procedure Notes (Addendum)
Date/Time: 01/30/2014 8:29 AM Performed by: Andree Elk, Daishia Fetterly A Pre-anesthesia Checklist: Patient identified, Timeout performed, Emergency Drugs available, Suction available and Patient being monitored Patient Re-evaluated:Patient Re-evaluated prior to inductionOxygen Delivery Method: Simple face mask   Spinal Patient location during procedure: OR Start time: 01/30/2014 8:47 AM Staffing Resident/CRNA: Horst Ostermiller A Preanesthetic Checklist Completed: patient identified, site marked, surgical consent, pre-op evaluation, timeout performed, IV checked, risks and benefits discussed and monitors and equipment checked Spinal Block Patient position: right lateral decubitus Prep: Betadine Patient monitoring: heart rate, cardiac monitor, continuous pulse ox and blood pressure Approach: right paramedian Location: L3-4 Injection technique: single-shot Needle Needle type: Spinocan  Needle gauge: 22 G Needle length: 9 cm Assessment Sensory level: T8 Additional Notes ATTEMPTS:1 TRAY MP:[id number] TRAY EXPIRATION DATE:03/2015

## 2014-01-30 NOTE — Transfer of Care (Signed)
Immediate Anesthesia Transfer of Care Note  Patient: Sandra Williams  Procedure(s) Performed: Procedure(s): TOTAL KNEE ARTHROPLASTY (Right)  Patient Location: PACU  Anesthesia Type:Spinal  Level of Consciousness: awake, alert , oriented and patient cooperative  Airway & Oxygen Therapy: Patient Spontanous Breathing and Patient connected to nasal cannula oxygen  Post-op Assessment: Report given to PACU RN and Post -op Vital signs reviewed and stable  Post vital signs: Reviewed and stable  Complications: No apparent anesthesia complications

## 2014-01-30 NOTE — Brief Op Note (Signed)
01/30/2014  10:48 AM  PATIENT:  Sandra Williams  81 y.o. female  PRE-OPERATIVE DIAGNOSIS:  osteoarthritis right knee  POST-OPERATIVE DIAGNOSIS:  osteoarthritis right knee  PROCEDURE:  Procedure(s): TOTAL KNEE ARTHROPLASTY (Right)  Depuy 57f 3t 38p 10 poly   SURGEON:  Surgeon(s) and Role:    * Carole Civil, MD - Primary  PHYSICIAN ASSISTANT:   ASSISTANTS: BETTY ASHLEY AND DEBI DALLAS   ANESTHESIA:   spinal  EBL:  Total I/O In: 1100 [I.V.:1100] Out: 100 [Urine:50; Blood:50]  BLOOD ADMINISTERED:none  DRAINS: HEMOVAC   LOCAL MEDICATIONS USED:  MARCAINE    and Amount: 60 ml  SPECIMEN:  No Specimen  DISPOSITION OF SPECIMEN:  N/A  COUNTS:  YES  TOURNIQUET:   Total Tourniquet Time Documented: Thigh (Right) - 85 minutes Total: Thigh (Right) - 85 minutes   DICTATION: .Viviann Spare Dictation  PLAN OF CARE: Admit to inpatient   PATIENT DISPOSITION:  PACU - hemodynamically stable.   Delay start of Pharmacological VTE agent (>24hrs) due to surgical blood loss or risk of bleeding: yes

## 2014-01-30 NOTE — Progress Notes (Signed)
Patient tolerated clear liquids and full liquids.  Advanced diet to soft. Will continue to monitor patient.

## 2014-01-30 NOTE — Op Note (Signed)
OP REPORT FOR 01-30-2014  Preop diagnosis osteoarthritis right knee  Postop diagnosis same  Procedure right total knee arthroplasty  Implants:  fixed-bearing posterior stabilized DepUY sigma knee replacement  Surgical findings:  The patient had a valgus knee with a flexion contracture and a preop passive range of motion of 95 under anesthesia, primarily lateral compartment disease once the joint was opened.  Details of procedure   The patient was identified by 2 approved identification mechanism. The operative extremity was evaluated and found to be acceptable for surgical treatment today. The chart was reviewed. The surgical site was confirmed and marked.  The patient was taken to the operating room and given the following antibiotic ancef 2 gm. This is consistent with the SCIP medications.  The patient was given the following anesthetic: spinal   The patient was then placed supine on the operating table. A Foley catheter was inserted. The operative extremity (right) was prepped and draped sterilely from the toes to the groin.  Timeout procedure was executed confirming the patient's name, surgical site, antibiotic administration, x-rays available, and implants available.  The operative limb (right lower extremity),  was exsanguinated with a six-inch Esmarch and the tourniquet was inflated to 300 mmHg.  A straight midline incision was made and taken down to the extensor mechanism. A medial arthrotomy was performed. The patella was everted and the patellofemoral ligament was released. The anterior cruciate ligament and PCL were resected.  The anterior horns of the lateral and medial meniscus were resected. The medial soft tissue sleeve was elevated to the mid coronal plane.  A three-eighths inch drill bit was used to enter the femoral canal which was decompressed with suction and irrigation until clear. The distal femoral cutting guide was set for 11 mm distal resection,  5valgus  alignment, for a right  knee. The distal femur was resected and checked for flatness.  The Depuy Sigma sizing femoral guide was placed and the femur was sized to a size 3 . A 4-in-1 cutting block was placed along with collateral ligament retractors and the distal femoral cuts were completed.  The external alignment guide for the tibial resection was then applied to the distal and proximal tibia and set for anatomic slope along with 2 MM resection  from the  Depth of the deficient lateral plateau .   Rotational alignment was set using the malleolus, the tibial tubercle and the tibial spines.  The proximal tibia was resected residual menisci were removed. The tibia was sized using a base plate to a size  3 .   Spacer blocks were used to confirm equal flexion extension gaps with releases done as needed. The knee was tight laterally in extension. The piecrust technique was used to release the lateral soft tissue sleeve using a laminar spreader to place tension laterally. After release the knee was still tight and the distal femur was recut resect 2 mm of bone and repeating the distal femoral cuts.    A size 10 MM spacer block gave equal stability and flexion extension.  The notch cutting guide for the femur was then applied and the notch cut was made with a size 3 guide.  Trial reduction was completed using 23F, 3T  trial implants. Patella packing was normal  We then skeletonized the patella. It measured 20 in thickness and the patellar resection was set for 12 will meters. the patellar resection was completed. The patella diameter measured 38. We then drilled the peg holes for the patella  The  proximal tibia was then punched using the size 3 base plate.  Thorough irrigation was performed and the bone was dried and prepared for cement. The cement was mixed on the back table using third generation preparation techniques  60 cc of MARCAINE WITH EPI WAS INJECTED IN THE SOFT TISSUES   The implants  was then cemented in place and excess cement was removed. The cement was allowed to cure. Irrigation was repeated and excess and residual bone fragments and cement were removed.  A medium-size Hemovac was placed in the joint with 6 openings in the tube left in the joint.  The extensor mechanism was closed with #1 Bralon suture followed by subcutaneous tissue closure using 0 Monocryl suture  Skin approximation was performed using staples  A sterile dressing was applied using Mepilex dressing followed by application of a long TED hose  The patient was taken recovery room in stable condition

## 2014-01-30 NOTE — Anesthesia Preprocedure Evaluation (Signed)
Anesthesia Evaluation  Patient identified by MRN, date of birth, ID band Patient awake    Reviewed: Allergy & Precautions, H&P , NPO status , Patient's Chart, lab work & pertinent test results  History of Anesthesia Complications Negative for: history of anesthetic complications  Airway Mallampati: II  TM Distance: >3 FB     Dental  (+) Teeth Intact, Poor Dentition   Pulmonary neg pulmonary ROS,  breath sounds clear to auscultation        Cardiovascular hypertension, Pt. on medications Rhythm:Regular Rate:Normal     Neuro/Psych    GI/Hepatic GERD-  Medicated and Controlled,  Endo/Other    Renal/GU      Musculoskeletal  (+) Arthritis -, Osteoarthritis,    Abdominal   Peds  Hematology  (+) anemia ,   Anesthesia Other Findings Hx sciatica, lumbar spinal stenosis.  Reproductive/Obstetrics                             Anesthesia Physical Anesthesia Plan  ASA: III  Anesthesia Plan: Spinal   Post-op Pain Management:    Induction: Intravenous  Airway Management Planned: Simple Face Mask  Additional Equipment:   Intra-op Plan:   Post-operative Plan:   Informed Consent: I have reviewed the patients History and Physical, chart, labs and discussed the procedure including the risks, benefits and alternatives for the proposed anesthesia with the patient or authorized representative who has indicated his/her understanding and acceptance.     Plan Discussed with:   Anesthesia Plan Comments:         Anesthesia Quick Evaluation

## 2014-01-31 LAB — CBC
HEMATOCRIT: 31.6 % — AB (ref 36.0–46.0)
HEMOGLOBIN: 10 g/dL — AB (ref 12.0–15.0)
MCH: 28.2 pg (ref 26.0–34.0)
MCHC: 31.6 g/dL (ref 30.0–36.0)
MCV: 89.3 fL (ref 78.0–100.0)
Platelets: 239 10*3/uL (ref 150–400)
RBC: 3.54 MIL/uL — AB (ref 3.87–5.11)
RDW: 13.9 % (ref 11.5–15.5)
WBC: 7.5 10*3/uL (ref 4.0–10.5)

## 2014-01-31 LAB — BASIC METABOLIC PANEL
Anion gap: 6 (ref 5–15)
BUN: 10 mg/dL (ref 6–23)
CO2: 27 mmol/L (ref 19–32)
Calcium: 8.4 mg/dL (ref 8.4–10.5)
Chloride: 105 mmol/L (ref 96–112)
Creatinine, Ser: 0.64 mg/dL (ref 0.50–1.10)
GFR calc non Af Amer: 78 mL/min — ABNORMAL LOW (ref >90)
GLUCOSE: 167 mg/dL — AB (ref 70–99)
POTASSIUM: 4.3 mmol/L (ref 3.5–5.1)
Sodium: 138 mmol/L (ref 135–145)

## 2014-01-31 LAB — PROTIME-INR
INR: 1.14 (ref 0.00–1.49)
PROTHROMBIN TIME: 14.7 s (ref 11.6–15.2)

## 2014-01-31 MED ORDER — HYDROCODONE-ACETAMINOPHEN 5-325 MG PO TABS
1.0000 | ORAL_TABLET | ORAL | Status: DC | PRN
  Administered 2014-01-31 – 2014-02-02 (×7): 1 via ORAL
  Filled 2014-01-31 (×7): qty 1

## 2014-01-31 MED ORDER — WARFARIN SODIUM 5 MG PO TABS
5.0000 mg | ORAL_TABLET | Freq: Once | ORAL | Status: AC
  Administered 2014-01-31: 5 mg via ORAL
  Filled 2014-01-31: qty 1

## 2014-01-31 NOTE — Progress Notes (Signed)
Physical Therapy Treatment Patient Details Name: Kawthar Ennen MRN: [id number] DOB: 1925/08/25 Today's Date: 01/31/2014    History of Present Illness [name], 81 y.o. female, has a history of pain and functional disability in the right knee due to arthritis and has failed non-surgical conservative treatments for greater than 12 weeks to includeNSAID's and/or analgesics, corticosteriod injections, use of assistive devices and activity modification.  Onset of symptoms was gradual, starting >10 years ago with gradually worsening course since that time.  Pt underwent Rt TKA on 01/30/14; MD orders for WBAT.     PT Comments    Pt has been much more lethargic today.  We attempted to see her this AM but she was sleepy and confused from having had morphine earlier.  She was seen after lunch was served for another attempt.  She needed to use the [facility] so she was instructed in a stand pivot transfer using a walker.  She had extreme difficulty off loading weight from her LLE in order to move either foot.  After completing this transfer back and forth, she wanted to complete eating her lunch.  We returned about 40 minutes later, instructed again on transfers from chair to bed.  She again had great difficulty moving her feet in order to complete the transfer.  Once in the bed, therapeutic exercise was initiated per protocol.  Her AA ROM right knee =- 10-72 degrees.  She has mod visible edema, pain well controlled orally.  CPM was initiated, 0-60 degrees along with SCD and ice to the knee.  She was quite comfortable and RN was alerted to remove after 6-8 hours.  Pt was asked to inform the RN if she had any discomfort or if she needed to use the [facility].  Follow Up Recommendations  SNF     Equipment Recommendations  None recommended by PT    Recommendations for Other Services  OT     Precautions / Restrictions Precautions Precautions: Fall;Knee Restrictions RLE Weight Bearing: Weight bearing as  tolerated    Mobility  Bed Mobility Overal bed mobility: Needs Assistance Bed Mobility: Sit to Supine       Sit to supine: Max assist   General bed mobility comments: pt is more lethargic today and has been far less mobile  Transfers Overall transfer level: Needs assistance Equipment used: Rolling walker (2 wheeled) Transfers: Sit to/from [facility] Sit to Stand: Max assist Stand pivot transfers: Mod assist          Ambulation/Gait Ambulation/Gait assistance:  (unable to ambulate due to lethargy)               Stairs            Wheelchair Mobility    Modified Rankin (Stroke Patients Only)       Balance   Sitting-balance support: No upper extremity supported;Feet supported Sitting balance-Leahy Scale: Good     Standing balance support: Bilateral upper extremity supported Standing balance-Leahy Scale: Fair                      Cognition Arousal/Alertness: Lethargic Behavior During Therapy: WFL for tasks assessed/performed Overall Cognitive Status: Within Functional Limits for tasks assessed                      Exercises Total Joint Exercises Ankle Circles/Pumps: AROM;Both;10 reps;Supine Quad Sets: AROM;Both;10 reps;Supine Short Arc Quad: AAROM;Right;10 reps;Supine Heel Slides: AAROM;Right;5 reps;Supine Knee Flexion: AAROM;Right;5 reps;Seated Goniometric ROM: 10-72 degrees, AA  RLE    General Comments        Pertinent Vitals/Pain Pain Assessment: No/denies pain    Home Living                      Prior Function            PT Goals (current goals can now be found in the care plan section) Progress towards PT goals: Not progressing toward goals - comment (more lethargic today)    Frequency       PT Plan Current plan remains appropriate;Discharge plan needs to be updated;Equipment recommendations need to be updated    Co-evaluation             End of Session Equipment Utilized  During Treatment: Gait belt Activity Tolerance: Patient tolerated treatment well Patient left: in bed;in CPM;with call bell/phone within reach;with bed alarm set     Time: 1412 (also seen from 1315 to 1334 for transfer training)-1504 PT Time Calculation (min) (ACUTE ONLY): 52 min  Charges:  $Therapeutic Exercise: 8-22 mins $Therapeutic Activity: 23-37 mins $Self Care/Home Management: 8-22                    G Codes:      Sable Feil 2014/02/08, 3:19 PM

## 2014-01-31 NOTE — Clinical Social Work Placement (Signed)
Clinical Social Work Department CLINICAL SOCIAL WORK PLACEMENT NOTE 01/31/2014  Patient:  Sandra Williams, Sandra Williams  Account Number:  [id number] [place] date:  01/30/2014  Clinical Social Worker:  Benay Pike, LCSW  Date/time:  01/31/2014 02:42 PM  Clinical Social Work is seeking post-discharge placement for this patient at the following level of care:   [place]   (*CSW will update this form in Epic as items are completed)   01/31/2014  Patient/family provided with [place] Department of Clinical Social Work's list of facilities offering this level of care within the geographic area requested by the patient (or if unable, by the patient's family).  01/31/2014  Patient/family informed of their freedom to choose among providers that offer the needed level of care, that participate in Medicare, Medicaid or managed care program needed by the patient, have an available bed and are willing to accept the patient.  01/31/2014  Patient/family informed of [facility]' ownership interest in [facility], as well as of the fact that they are under no obligation to receive care at this facility.  PASARR submitted to EDS on 01/31/2014 PASARR number received on 01/31/2014  FL2 transmitted to all facilities in geographic area requested by pt/family on  01/31/2014 FL2 transmitted to all facilities within larger geographic area on   Patient informed that his/her managed care company has contracts with or will negotiate with  certain facilities, including the following:     Patient/family informed of bed offers received:   Patient chooses bed at  Physician recommends and patient chooses bed at    Patient to be transferred to  on   Patient to be transferred to facility by  Patient and family notified of transfer on  Name of family member notified:    The following physician request were entered in Epic:   Additional Comments:  Benay Pike, [place]

## 2014-01-31 NOTE — Anesthesia Postprocedure Evaluation (Signed)
  Anesthesia Post-op Note  Patient: Sandra Williams  Procedure(s) Performed: Procedure(s): TOTAL KNEE ARTHROPLASTY (Right)  Patient Location: Room 339  Anesthesia Type:Spinal  Level of Consciousness: awake, alert , oriented and patient cooperative  Airway and Oxygen Therapy: Patient Spontanous Breathing  Post-op Pain: mild  Post-op Assessment: Post-op Vital signs reviewed, Patient's Cardiovascular Status Stable, Respiratory Function Stable, Patent Airway, No signs of Nausea or vomiting, Pain level controlled and No headache  Post-op Vital Signs: Reviewed and stable  Last Vitals:  Filed Vitals:   01/31/14 0741  BP: 172/53  Pulse: 71  Temp: 37.5 C  Resp: 18    Complications: No apparent anesthesia complications

## 2014-01-31 NOTE — Evaluation (Signed)
Occupational Therapy Evaluation Patient Details Name: Dava Rensch MRN: [id number] DOB: 1925/11/29 Today's Date: 01/31/2014    History of Present Illness [name], 81 y.o. female, has a history of pain and functional disability in the right knee due to arthritis and has failed non-surgical conservative treatments for greater than 12 weeks to includeNSAID's and/or analgesics, corticosteriod injections, use of assistive devices and activity modification.  Onset of symptoms was gradual, starting >10 years ago with gradually worsening course since that time.  Pt underwent Rt TKA on 01/30/14; MD orders for WBAT.    Clinical Impression   Patient will benefit from skilled OT services to increase functional performance during BADL tasks. Pt had increased difficulty this AM with weight shifting while transitioning from bed to recliner. Constant verbal and physical cues needed for sequencing and safety. Pt's cognition seemed impaired as well. She had difficulty following directions and remembering what leg the therapist asked her to move during transfer. Recommend SNF at discharge.     Follow Up Recommendations  SNF    Equipment Recommendations  None recommended by OT       Precautions / Restrictions Precautions Precautions: Fall Restrictions Weight Bearing Restrictions: Yes RLE Weight Bearing: Weight bearing as tolerated      Mobility Bed Mobility Overal bed mobility: Needs Assistance Bed Mobility: Supine to Sit     Supine to sit: Mod assist;HOB elevated        Transfers Overall transfer level: Needs assistance Equipment used: Rolling walker (2 wheeled)   Sit to Stand: Mod assist;From elevated surface         General transfer comment: Continuous VC for technique for hand/foot placement with use of RW         ADL Overall ADL's : Needs assistance/impaired                     Lower Body Dressing: Total assistance;Bed level   Toilet Transfer:  Moderate assistance;RW;Ambulation;Cueing for safety;Cueing for sequencing             General ADL Comments: Increased time needed to complete transfer this session. Patient had difficulty sequencing, bearing weight on right leg, attention to task, etc.                Pertinent Vitals/Pain Pain Assessment: No/denies pain     Hand Dominance Right   Extremity/Trunk Assessment Upper Extremity Assessment Upper Extremity Assessment: Overall WFL for tasks assessed   Lower Extremity Assessment Lower Extremity Assessment: Defer to PT evaluation       Communication Communication Communication: No difficulties   Cognition Arousal/Alertness: Lethargic Behavior During Therapy: WFL for tasks assessed/performed Overall Cognitive Status: Impaired/Different from baseline Area of Impairment: Memory;Following commands;Safety/judgement;Awareness;Problem solving;Attention     Memory: Decreased short-term memory Following Commands: Follows one step commands consistently Safety/Judgement: Decreased awareness of safety   Problem Solving: Slow processing;Requires verbal cues;Requires tactile cues;Difficulty sequencing;Decreased initiation                Home Living Family/patient expects to be discharged to:: Skilled nursing facility Living Arrangements:  (in between nephew, sister and son) Available Help at Discharge: Family;Available 24 hours/day Type of Home: House Home Access: Stairs to enter [employer] of Steps: 3 Entrance Stairs-Rails: Can reach both Home Layout: One level               Home Equipment: Grab bars - tub/shower;Tub bench;Bedside commode   Additional Comments: Tub Shower      Prior Functioning/Environment Level of Independence:  Independent             OT Diagnosis: Generalized weakness   OT Problem List: Decreased strength;Decreased activity tolerance;Impaired balance (sitting and/or standing)   OT Treatment/Interventions:  Self-care/ADL training;Therapeutic exercise;Neuromuscular education;DME and/or AE instruction;Manual therapy;Patient/family education;Therapeutic activities;Modalities    OT Goals(Current goals can be found in the care plan section) Acute Rehab OT Goals Patient Stated Goal: to go home OT Goal Formulation: With patient Time For Goal Achievement: 02/14/14 Potential to Achieve Goals: Good  OT Frequency: Min 2X/week    End of Session Equipment Utilized During Treatment: Gait belt;Rolling walker Nurse Communication: Mobility status  Activity Tolerance: Patient tolerated treatment well;Patient limited by fatigue Patient left: in chair;with call bell/phone within reach;with chair alarm set   Time: 0830-0910 OT Time Calculation (min): 40 min Charges:  OT General Charges $OT Visit: 1 Procedure OT Evaluation $Initial OT Evaluation Tier I: 1 Procedure G-Codes:    Ailene Ravel, OTR/L,CBIS  [phone]  01/31/2014, 9:49 AM

## 2014-01-31 NOTE — Addendum Note (Signed)
Addendum  created 01/31/14 1346 by Mickel Baas, CRNA   Modules edited: Notes Section   Notes Section:  File: [id number]

## 2014-01-31 NOTE — Progress Notes (Signed)
UR chart review completed.  

## 2014-01-31 NOTE — Clinical Social Work Note (Signed)
CSW received consult for possible SNF. Pt worked with PT yesterday and did very well. Recommendation at that time was for home health. Pt was unable to work with PT this morning as she was not alert enough. CSW will continue to follow.  Benay Pike, [place]

## 2014-01-31 NOTE — Clinical Social Work Psychosocial (Signed)
Clinical Social Work Department BRIEF PSYCHOSOCIAL ASSESSMENT 01/31/2014  Patient:  Sandra Williams, Sandra Williams     Account Number:  [id number]     Admit date:  01/30/2014  Clinical Social Worker:  Wyatt Haste  Date/Time:  01/31/2014 02:43 PM  Referred by:  Physician  Date Referred:  01/31/2014 Referred for  SNF Placement   Other Referral:   Interview type:  Patient Other interview type:   daughter and sister    PSYCHOSOCIAL DATA Living Status:  FAMILY Admitted from facility:   Level of care:   Primary support name:  Sandra Williams Primary support relationship to patient:  FAMILY Degree of support available:   supportive    CURRENT CONCERNS Current Concerns  Post-Acute Placement   Other Concerns:    SOCIAL WORK ASSESSMENT / PLAN CSW met with pt and pt's daughter, Sandra Williams and sister, Sandra Williams at bedside. Pt alert, but somewhat confused. Sandra Williams provided most of information for assessment. She states that pt alternates living with her and [place]. Pt admitted after TKA. Yesterday, pt worked with PT and did very well. However, today she is having much more difficulty and recommendation is for SNF. Pt states she was prepared to go to rehab after discussion with MD prior to surgery. She is fairly independent at baseline and uses a cane. Family report it is most important for pt to remain in [place] so they can visit more often. SNF list provided and they are aware of Medicare coverage/criteria. CSW will initiate bed search.   Assessment/plan status:  Psychosocial Support/Ongoing Assessment of Needs Other assessment/ plan:   Information/referral to community resources:   SNF list    PATIENT'S/FAMILY'S RESPONSE TO PLAN OF CARE: Pt and family agree that short term SNF would be beneficial prior to return home.       Benay Pike, [place]

## 2014-01-31 NOTE — Progress Notes (Signed)
PT Cancellation Note  Patient Details Name: Sandra Williams MRN: [id number] DOB: [birth year]   Cancelled Treatment:    Reason Eval/Treat Not Completed: Patient's level of consciousness   Sable Feil 01/31/2014, 11:12 AM

## 2014-01-31 NOTE — Care Management Note (Addendum)
    Page 1 of 1   02/01/2014     2:30:03 PM CARE MANAGEMENT NOTE 02/01/2014  Patient:  Sandra Williams, Sandra Williams   Account Number:  [id number]  Date Initiated:  01/31/2014  Documentation initiated by:  Theophilus Kinds  Subjective/Objective Assessment:   Pt admitted from home s/p knee. Pt lives with her sister and would like to return home at discharge. Pt had been fairly independent with ADl's.     Action/Plan:   PT recommends pt discharge to SNF at discharge. CSW is aware and will collect the pts information and initiiate bed search.   Anticipated DC Date:  02/02/2014   Anticipated DC Plan:  SKILLED NURSING FACILITY  In-house referral  Clinical Social Worker      DC Planning Services  CM consult      Choice offered to / List presented to:             Status of service:  Completed, signed off Medicare Important Message given?  YES (If response is "NO", the following Medicare IM given date fields will be blank) Date Medicare IM given:  02/01/2014 Medicare IM given by:  Theophilus Kinds Date Additional Medicare IM given:   Additional Medicare IM given by:    Discharge Disposition:  [place]  Per UR Regulation:    If discussed at Long Length of Stay Meetings, dates discussed:    Comments:  02/01/14 [place], RN BSN CM Anticipate discharge on 02/02/14 to [facility]. CSW to arrange discharge to facility.  01/31/14 [place], RN BSN CM

## 2014-02-01 ENCOUNTER — Encounter (HOSPITAL_COMMUNITY): Payer: Self-pay | Admitting: Orthopedic Surgery

## 2014-02-01 LAB — CBC
HCT: 30 % — ABNORMAL LOW (ref 36.0–46.0)
Hemoglobin: 9.7 g/dL — ABNORMAL LOW (ref 12.0–15.0)
MCH: 28.6 pg (ref 26.0–34.0)
MCHC: 32.3 g/dL (ref 30.0–36.0)
MCV: 88.5 fL (ref 78.0–100.0)
Platelets: 230 10*3/uL (ref 150–400)
RBC: 3.39 MIL/uL — ABNORMAL LOW (ref 3.87–5.11)
RDW: 14.1 % (ref 11.5–15.5)
WBC: 8.7 10*3/uL (ref 4.0–10.5)

## 2014-02-01 LAB — PROTIME-INR
INR: 1.53 — ABNORMAL HIGH (ref 0.00–1.49)
PROTHROMBIN TIME: 18.5 s — AB (ref 11.6–15.2)

## 2014-02-01 LAB — CLOSTRIDIUM DIFFICILE BY PCR: Toxigenic C. Difficile by PCR: POSITIVE — AB

## 2014-02-01 MED ORDER — MORPHINE SULFATE 2 MG/ML IJ SOLN: 2.0000 mg | INTRAMUSCULAR | Status: DC | PRN

## 2014-02-01 MED ORDER — WARFARIN SODIUM 5 MG PO TABS
2.5000 mg | ORAL_TABLET | Freq: Every day | ORAL | Status: DC
  Administered 2014-02-01: 2.5 mg via ORAL
  Filled 2014-02-01: qty 1

## 2014-02-01 NOTE — Progress Notes (Signed)
Physical Therapy Treatment Patient Details Name: Sandra Williams MRN: [id number] DOB: 09-Oct-1925 Today's Date: 02/01/2014    History of Present Illness Sandra Williams, 81 y.o. female, has a history of pain and functional disability in the right knee due to arthritis and has failed non-surgical conservative treatments for greater than 12 weeks to includeNSAID's and/or analgesics, corticosteriod injections, use of assistive devices and activity modification.  Onset of symptoms was gradual, starting >10 years ago with gradually worsening course since that time.  Pt underwent Rt TKA on 01/30/14; MD orders for WBAT.     PT Comments    Pt was up in a chair for about 2 hours, tolerated well.  She was able to transfer sit to stand with mod/max assist.  She was instructed in gait with walker, WBAT right.  She ambulated about 8' with cues for each step taken.  She returned to the bed with max assist due to fatigue.  Pt was able to participate in therapeutic exercise, 10 repetitions each.  ROM right LE=10-78 degrees AA.  She was planned to have a bath so CPM was set at 0-70 degrees and CNA will place it under her RLE after the bath.  Follow Up Recommendations  SNF     Equipment Recommendations  None recommended by PT    Recommendations for Other Services  OT     Precautions / Restrictions Precautions Precautions: Fall;Knee Precaution Booklet Issued: No Restrictions RLE Weight Bearing: Weight bearing as tolerated    Mobility  Bed Mobility Overal bed mobility: Needs Assistance Bed Mobility: Supine to Sit     Supine to sit: Mod assist;HOB elevated Sit to supine: Max assist      Transfers Overall transfer level: Needs assistance Equipment used: Rolling walker (2 wheeled) Transfers: Sit to/from Stand Sit to Stand: Mod assist            Ambulation/Gait Ambulation/Gait assistance: Mod assist Ambulation Distance (Feet): 8 Feet Assistive device: Rolling walker (2  wheeled) Gait Pattern/deviations: Step-to pattern;Decreased stance time - right   Gait velocity interpretation: Below normal speed for age/gender     Stairs            Wheelchair Mobility    Modified Rankin (Stroke Patients Only)       Balance     Sitting balance-Leahy Scale: Good     Standing balance support: Bilateral upper extremity supported Standing balance-Leahy Scale: Good                      Cognition Arousal/Alertness: Awake/alert Behavior During Therapy: WFL for tasks assessed/performed Overall Cognitive Status: Within Functional Limits for tasks assessed                      Exercises Total Joint Exercises Ankle Circles/Pumps: AROM;Both;10 reps;Supine Quad Sets: AROM;Both;10 reps;Supine Short Arc Quad: AAROM;Right;10 reps;Supine Heel Slides: AAROM;Right;10 reps;Supine Goniometric ROM: 10-78 degrees, AA right knee    General Comments        Pertinent Vitals/Pain Pain Assessment: No/denies pain    Home Living                      Prior Function            PT Goals (current goals can now be found in the care plan section) Progress towards PT goals: Progressing toward goals    Frequency  Min 6X/week    PT Plan Current plan remains appropriate    Co-evaluation  End of Session Equipment Utilized During Treatment: Gait belt Activity Tolerance: Patient tolerated treatment well Patient left: in bed;with call bell/phone within reach;with bed alarm set     Time: 1351-1426 PT Time Calculation (min) (ACUTE ONLY): 35 min  Charges:  $Gait Training: 8-22 mins $Therapeutic Exercise: 8-22 mins $Therapeutic Activity: 8-22 mins                    G Codes:      Sandra Williams 11-Feb-2014, 2:34 PM

## 2014-02-01 NOTE — Discharge Instructions (Signed)
Information on my medicine - Coumadin®   (Warfarin) ° °This medication education was reviewed with me or my healthcare representative as part of my discharge preparation.   ° °Why was Coumadin prescribed for you? °Coumadin was prescribed for you because you have a blood clot or a medical condition that can cause an increased risk of forming blood clots. Blood clots can cause serious health problems by blocking the flow of blood to the heart, lung, or brain. Coumadin can prevent harmful blood clots from forming. °As a reminder your indication for Coumadin is:   Blood Clot Prevention After Orthopedic Surgery ° °What test will check on my response to Coumadin? °While on Coumadin (warfarin) you will need to have an INR test regularly to ensure that your dose is keeping you in the desired range. The INR (international normalized ratio) number is calculated from the result of the laboratory test called prothrombin time (PT). ° °If an INR APPOINTMENT HAS NOT ALREADY BEEN MADE FOR YOU please schedule an appointment to have this lab work done by your health care provider within 7 days. °Your INR goal is usually a number between:  2 to 3 or your provider may give you a more narrow range like 2-2.5.  Ask your health care provider during an office visit what your goal INR is. ° °What  do you need to  know  About  COUMADIN? °Take Coumadin (warfarin) exactly as prescribed by your healthcare provider about the same time each day.  DO NOT stop taking without talking to the doctor who prescribed the medication.  Stopping without other blood clot prevention medication to take the place of Coumadin may increase your risk of developing a new clot or stroke.  Get refills before you run out. ° °What do you do if you miss a dose? °If you miss a dose, take it as soon as you remember on the same day then continue your regularly scheduled regimen the next day.  Do not take two doses of Coumadin at the same time. ° °Important Safety  Information °A possible side effect of Coumadin (Warfarin) is an increased risk of bleeding. You should call your healthcare provider right away if you experience any of the following: °  Bleeding from an injury or your nose that does not stop. °  Unusual colored urine (red or dark brown) or unusual colored stools (red or black). °  Unusual bruising for unknown reasons. °  A serious fall or if you hit your head (even if there is no bleeding). ° °Some foods or medicines interact with Coumadin® (warfarin) and might alter your response to warfarin. To help avoid this: °  Eat a balanced diet, maintaining a consistent amount of Vitamin K. °  Notify your provider about major diet changes you plan to make. °  Avoid alcohol or limit your intake to 1 drink for women and 2 drinks for men per day. °(1 drink is 5 oz. wine, 12 oz. beer, or 1.5 oz. liquor.) ° °Make sure that ANY health care provider who prescribes medication for you knows that you are taking Coumadin (warfarin).  Also make sure the healthcare provider who is monitoring your Coumadin knows when you have started a new medication including herbals and non-prescription products. ° °Coumadin® (Warfarin)  Major Drug Interactions  °Increased Warfarin Effect Decreased Warfarin Effect  °Alcohol (large quantities) °Antibiotics (esp. Septra/Bactrim, Flagyl, Cipro) °Amiodarone (Cordarone) °Aspirin (ASA) °Cimetidine (Tagamet) °Megestrol (Megace) °NSAIDs (ibuprofen, naproxen, etc.) °Piroxicam (Feldene) °Propafenone (Rythmol SR) °Propranolol (  Inderal) °Isoniazid (INH) °Posaconazole (Noxafil) Barbiturates (Phenobarbital) °Carbamazepine (Tegretol) °Chlordiazepoxide (Librium) °Cholestyramine (Questran) °Griseofulvin °Oral Contraceptives °Rifampin °Sucralfate (Carafate) °Vitamin K  ° °Coumadin® (Warfarin) Major Herbal Interactions  °Increased Warfarin Effect Decreased Warfarin Effect  °Garlic °Ginseng °Ginkgo biloba Coenzyme Q10 °Green tea °St. John’s wort   ° °Coumadin® (Warfarin)  FOOD Interactions  °Eat a consistent number of servings per week of foods HIGH in Vitamin K °(1 serving = ½ cup)  °Collards (cooked, or boiled & drained) °Kale (cooked, or boiled & drained) °Mustard greens (cooked, or boiled & drained) °Parsley *serving size only = ¼ cup °Spinach (cooked, or boiled & drained) °Swiss chard (cooked, or boiled & drained) °Turnip greens (cooked, or boiled & drained)  °Eat a consistent number of servings per week of foods MEDIUM-HIGH in Vitamin K °(1 serving = 1 cup)  °Asparagus (cooked, or boiled & drained) °Broccoli (cooked, boiled & drained, or raw & chopped) °Brussel sprouts (cooked, or boiled & drained) *serving size only = ½ cup °Lettuce, raw (green leaf, endive, romaine) °Spinach, raw °Turnip greens, raw & chopped  ° °These websites have more information on Coumadin (warfarin):  www.coumadin.com; °www.ahrq.gov/consumer/coumadin.htm; ° ° ° °

## 2014-02-01 NOTE — Clinical Social Work Placement (Signed)
Clinical Social Work Department CLINICAL SOCIAL WORK PLACEMENT NOTE 02/01/2014  Patient:  CONSTANCIA, GEETING  Account Number:  [id number] [place] date:  01/30/2014  Clinical Social Worker:  Benay Pike, LCSW  Date/time:  01/31/2014 02:42 PM  Clinical Social Work is seeking post-discharge placement for this patient at the following level of care:   [facility]   (*CSW will update this form in Epic as items are completed)   01/31/2014  Patient/family provided with [place] Department of Clinical Social Work's list of facilities offering this level of care within the geographic area requested by the patient (or if unable, by the patient's family).  01/31/2014  Patient/family informed of their freedom to choose among providers that offer the needed level of care, that participate in Medicare, Medicaid or managed care program needed by the patient, have an available bed and are willing to accept the patient.  01/31/2014  Patient/family informed of [facility]' ownership interest in [facility], as well as of the fact that they are under no obligation to receive care at this facility.  PASARR submitted to EDS on 01/31/2014 PASARR number received on 01/31/2014  FL2 transmitted to all facilities in geographic area requested by pt/family on  01/31/2014 FL2 transmitted to all facilities within larger geographic area on   Patient informed that his/her managed care company has contracts with or will negotiate with  certain facilities, including the following:     Patient/family informed of bed offers received:  02/01/2014 Patient chooses bed at [facility] Physician recommends and patient chooses bed at  [facility]  Patient to be transferred to [facility] on   Patient to be transferred to facility by  Patient and family notified of transfer on  Name of family member notified:    The following physician request were entered in Epic:   Additional  Comments:  Benay Pike, [place]

## 2014-02-01 NOTE — Progress Notes (Signed)
Note for 01-21-2014  Pod 1   Vitals were stable   Right leg neuro vascular intact  I found Sandra Williams sitting in chair sleeping, she was awakened easily.  Therapy  Continue warfarin

## 2014-02-01 NOTE — Clinical Social Work Note (Signed)
CSW presented bed offers and pt chooses [facility]. Pt's daughter, [name] also notified. Facility aware of probable d/c tomorrow.   Benay Pike, [place]

## 2014-02-01 NOTE — Progress Notes (Signed)
Patient cpm taken off at 2200.  Patient alert but still disoriented, requires frequent reorientation.  Patient has been getting  Up to use bsc, but needs two person max assistance.  Patient has difficulty following and receiving directions.  Otherwise patient has been stable during night.  Pain has been controlled with oral pain medication.

## 2014-02-01 NOTE — Progress Notes (Signed)
Physical Therapy Treatment Patient Details Name: Sandra Williams MRN: [id number] DOB: 10-Jul-1925 Today's Date: 02/01/2014    History of Present Illness Sandra Williams, 81 y.o. female, has a history of pain and functional disability in the right knee due to arthritis and has failed non-surgical conservative treatments for greater than 12 weeks to includeNSAID's and/or analgesics, corticosteriod injections, use of assistive devices and activity modification.  Onset of symptoms was gradual, starting >10 years ago with gradually worsening course since that time.  Pt underwent Rt TKA on 01/30/14; MD orders for WBAT.     PT Comments    Pt is much more alert today.  She reports no pain in the right knee at rest.  AA ROM of the right knee=12-72 degrees.  She requires mod assist to transfer supine to sit to stand with a walker.  Pt was able to ambulate 5' with a walker, mod assist to help her sequence gait pattern.  She is now up in chair doing well.  Follow Up Recommendations  SNF     Equipment Recommendations  None recommended by PT    Recommendations for Other Services  OT     Precautions / Restrictions Precautions Precautions: Fall;Knee Precaution Booklet Issued: No Restrictions RLE Weight Bearing: Weight bearing as tolerated    Mobility  Bed Mobility Overal bed mobility: Needs Assistance Bed Mobility: Supine to Sit     Supine to sit: Mod assist;HOB elevated        Transfers Overall transfer level: Needs assistance Equipment used: Rolling walker (2 wheeled) Transfers: Sit to/from Stand Sit to Stand: Mod assist            Ambulation/Gait Ambulation/Gait assistance: Mod assist Ambulation Distance (Feet): 5 Feet Assistive device: Rolling walker (2 wheeled) Gait Pattern/deviations: Step-to pattern;Decreased stance time - right   Gait velocity interpretation: Below normal speed for age/gender     Stairs            Wheelchair Mobility    Modified  Rankin (Stroke Patients Only)       Balance     Sitting balance-Leahy Scale: Good       Standing balance-Leahy Scale: Fair                      Cognition Arousal/Alertness: Awake/alert Behavior During Therapy: WFL for tasks assessed/performed Overall Cognitive Status: Within Functional Limits for tasks assessed                      Exercises Total Joint Exercises Ankle Circles/Pumps: AROM;Both;10 reps;Supine Quad Sets: AROM;Both;10 reps;Supine Short Arc Quad: AAROM;Right;10 reps;Supine Heel Slides: AAROM;Right;10 reps;Supine Goniometric ROM: 12-72 degrees right knee, AA    General Comments        Pertinent Vitals/Pain Pain Assessment: No/denies pain    Home Living                      Prior Function            PT Goals (current goals can now be found in the care plan section) Progress towards PT goals: Progressing toward goals    Frequency  Min 6X/week    PT Plan Current plan remains appropriate    Co-evaluation             End of Session Equipment Utilized During Treatment: Gait belt Activity Tolerance: Patient tolerated treatment well Patient left: in chair;with call bell/phone within reach;with chair alarm set     Time: 7829-5621  PT Time Calculation (min) (ACUTE ONLY): 34 min  Charges:  $Gait Training: 8-22 mins $Therapeutic Exercise: 8-22 mins                    G Codes:      Sandra Williams 02/03/14, 11:39 AM

## 2014-02-01 NOTE — Progress Notes (Signed)
  POD # 2 right tka    vitals signs are stable labs were unremarkable with an INR less than 2 dressing was clean dry and intact neurovascular status of the limb was normal  Patient is plan for discharge tomorrow. Today will continue therapy and progression towards discharge

## 2014-02-02 ENCOUNTER — Inpatient Hospital Stay
Admission: RE | Admit: 2014-02-02 | Discharge: 2014-03-11 | Disposition: A | Discharge status: Home / Self Care | Payer: BLUE CROSS/BLUE SHIELD | Source: Ambulatory Visit | Attending: Internal Medicine | Admitting: Internal Medicine

## 2014-02-02 DIAGNOSIS — R278 Other lack of coordination: Secondary | ICD-10-CM | POA: Diagnosis not present

## 2014-02-02 DIAGNOSIS — M6281 Muscle weakness (generalized): Secondary | ICD-10-CM | POA: Diagnosis not present

## 2014-02-02 DIAGNOSIS — K219 Gastro-esophageal reflux disease without esophagitis: Secondary | ICD-10-CM | POA: Diagnosis not present

## 2014-02-02 DIAGNOSIS — M25561 Pain in right knee: Secondary | ICD-10-CM | POA: Diagnosis not present

## 2014-02-02 DIAGNOSIS — I10 Essential (primary) hypertension: Secondary | ICD-10-CM | POA: Diagnosis not present

## 2014-02-02 DIAGNOSIS — M129 Arthropathy, unspecified: Secondary | ICD-10-CM | POA: Diagnosis not present

## 2014-02-02 DIAGNOSIS — R7611 Nonspecific reaction to tuberculin skin test without active tuberculosis: Secondary | ICD-10-CM | POA: Diagnosis not present

## 2014-02-02 DIAGNOSIS — M810 Age-related osteoporosis without current pathological fracture: Secondary | ICD-10-CM | POA: Diagnosis not present

## 2014-02-02 DIAGNOSIS — Z9660 Presence of unspecified orthopedic joint implant: Secondary | ICD-10-CM | POA: Diagnosis not present

## 2014-02-02 DIAGNOSIS — Z96651 Presence of right artificial knee joint: Secondary | ICD-10-CM | POA: Diagnosis not present

## 2014-02-02 DIAGNOSIS — Z471 Aftercare following joint replacement surgery: Secondary | ICD-10-CM | POA: Diagnosis not present

## 2014-02-02 DIAGNOSIS — M1711 Unilateral primary osteoarthritis, right knee: Secondary | ICD-10-CM | POA: Diagnosis not present

## 2014-02-02 DIAGNOSIS — R488 Other symbolic dysfunctions: Secondary | ICD-10-CM | POA: Diagnosis not present

## 2014-02-02 DIAGNOSIS — R197 Diarrhea, unspecified: Secondary | ICD-10-CM | POA: Diagnosis not present

## 2014-02-02 DIAGNOSIS — K5900 Constipation, unspecified: Secondary | ICD-10-CM | POA: Diagnosis not present

## 2014-02-02 DIAGNOSIS — R05 Cough: Secondary | ICD-10-CM | POA: Diagnosis not present

## 2014-02-02 DIAGNOSIS — M4806 Spinal stenosis, lumbar region: Secondary | ICD-10-CM | POA: Diagnosis not present

## 2014-02-02 DIAGNOSIS — A047 Enterocolitis due to Clostridium difficile: Secondary | ICD-10-CM | POA: Diagnosis not present

## 2014-02-02 DIAGNOSIS — M62838 Other muscle spasm: Secondary | ICD-10-CM | POA: Diagnosis not present

## 2014-02-02 DIAGNOSIS — R262 Difficulty in walking, not elsewhere classified: Secondary | ICD-10-CM | POA: Diagnosis not present

## 2014-02-02 DIAGNOSIS — E785 Hyperlipidemia, unspecified: Secondary | ICD-10-CM | POA: Diagnosis not present

## 2014-02-02 LAB — TYPE AND SCREEN
ABO/RH(D): O POS
ABO/RH(D): O POS
Antibody Screen: NEGATIVE
Antibody Screen: NEGATIVE
UNIT DIVISION: 0
Unit division: 0
Unit division: 0
Unit division: 0

## 2014-02-02 LAB — CBC
HCT: 28.1 % — ABNORMAL LOW (ref 36.0–46.0)
HEMOGLOBIN: 9 g/dL — AB (ref 12.0–15.0)
MCH: 28.3 pg (ref 26.0–34.0)
MCHC: 32 g/dL (ref 30.0–36.0)
MCV: 88.4 fL (ref 78.0–100.0)
PLATELETS: 218 10*3/uL (ref 150–400)
RBC: 3.18 MIL/uL — ABNORMAL LOW (ref 3.87–5.11)
RDW: 14.3 % (ref 11.5–15.5)
WBC: 8.6 10*3/uL (ref 4.0–10.5)

## 2014-02-02 LAB — PROTIME-INR
INR: 1.59 — ABNORMAL HIGH (ref 0.00–1.49)
Prothrombin Time: 19.1 seconds — ABNORMAL HIGH (ref 11.6–15.2)

## 2014-02-02 MED ORDER — WARFARIN SODIUM 2.5 MG PO TABS: 2.5000 mg | ORAL_TABLET | Freq: Every day | ORAL | Status: DC

## 2014-02-02 MED ORDER — METRONIDAZOLE 250 MG PO TABS: 250.0000 mg | ORAL_TABLET | Freq: Four times a day (QID) | ORAL | Status: DC

## 2014-02-02 MED ORDER — HYDROCODONE-ACETAMINOPHEN 5-325 MG PO TABS: 1.0000 | ORAL_TABLET | ORAL | Status: DC | PRN

## 2014-02-02 NOTE — Progress Notes (Signed)
NURSING PROGRESS NOTE  Sandra Williams [id number] Discharge Data: 02/02/2014 4:29 PM Attending Provider: No att. providers found QGB:EEFEO,FHQRFXJ, MD   [name] to be D/C'd Skilled nursing facility per MD order.    All IV's discontinued and monitored for bleeding.  All belongings returned to patient for patient to take home.  Patient left floor via wheelchair, escorted by NT.  Last Documented Vital Signs:  Blood pressure 154/59, pulse 86, temperature 98 F (36.7 C), temperature source Oral, resp. rate 20, height 5\' 3"  (1.6 m), weight 65.772 kg (145 lb), SpO2 97 %.  Cecilie Kicks D

## 2014-02-02 NOTE — Plan of Care (Signed)
Problem: Consults Goal: Diagnosis- Total Joint Replacement Outcome: Completed/Met Date Met:  02/02/14 Primary Total Knee  Problem: Discharge Progression Outcomes Goal: Activity appropriate for discharge plan Outcome: Completed/Met Date Met:  02/02/14 Will continue to receive physical therapy at SNF.

## 2014-02-02 NOTE — Discharge Summary (Addendum)
Physician Discharge Summary  Patient ID: Sandra Williams MRN: [id number] DOB/AGE: 08/17/1925 81 y.o.  Admit date: 01/30/2014 Discharge date: 02/02/2014  Admission Diagnoses: arthritis right knee  Discharge Diagnoses same Active Problems:   Osteoarthritis   Arthritis of knee, right   Discharged Condition: good  Hospital Course very stable, had some sleepiness with morphine and slow progress th therapy. On POD 2 she had diarrhea but was on severeal stool softeners and by protocol pcr c diff was sent. It was positive. So treatment will be 250 flagyl qid x 10 days   inr 1.6 (I prefer low dose warfarin with goal of 1.8-2.0) hgb 9.0     Discharge Exam: Blood pressure 154/59, pulse 86, temperature 98 F (36.7 C), temperature source Oral, resp. rate 20, height 5\' 3"  (1.6 m), weight 145 lb (65.772 kg), SpO2 97 %. Incision/Wound: clean  She is alert   Disposition: [facility]   Discharge Instructions    CPM    Complete by:  As directed   Continuous passive motion machine (CPM):      Use the CPM from  to 8 hrs a day 0-70 increase 10 per day for 21 days You may break it up into 2 or 3 sessions per day.     Call MD / Call 911    Complete by:  As directed   If you experience chest pain or shortness of breath, CALL 911 and be transported to the hospital emergency room.  If you develope a fever above 101 F, pus (white drainage) or increased drainage or redness at the wound, or calf pain, call your surgeon's office.     Change dressing    Complete by:  As directed      Constipation Prevention    Complete by:  As directed   Drink plenty of fluids.  Prune juice may be helpful.  You may use a stool softener, such as Colace (over the counter) 100 mg twice a day.  Use MiraLax (over the counter) for constipation as needed.     Diet - low sodium heart healthy    Complete by:  As directed      Do not put a pillow under the knee. Place it under the heel.    Complete by:  As directed      Increase activity slowly as tolerated    Complete by:  As directed             Medication List    STOP taking these medications        acetaminophen 325 MG tablet  Commonly known as:  TYLENOL     aspirin 81 MG tablet     predniSONE 5 MG Tabs tablet  Commonly known as:  STERAPRED UNI-PAK      TAKE these medications        HYDROcodone-acetaminophen 5-325 MG per tablet  Commonly known as:  NORCO/VICODIN  Take 1 tablet by mouth every 4 (four) hours as needed for moderate pain.     losartan 100 MG tablet  Commonly known as:  COZAAR  Take 100 mg by mouth every morning.     multivitamin with minerals Tabs tablet  Take 1 tablet by mouth daily.     pravastatin 40 MG tablet  Commonly known as:  PRAVACHOL  Take 40 mg by mouth every morning.     warfarin 2.5 MG tablet  Commonly known as:  COUMADIN  Take 1 tablet (2.5 mg total) by mouth daily at 6  PM.         Signed: Arther Abbott 02/02/2014, 8:47 AM

## 2014-02-04 ENCOUNTER — Non-Acute Institutional Stay (SKILLED_NURSING_FACILITY): Payer: Medicare Other | Admitting: Internal Medicine

## 2014-02-04 ENCOUNTER — Inpatient Hospital Stay (HOSPITAL_COMMUNITY)
Admit: 2014-02-04 | Discharge: 2014-02-04 | Disposition: A | Discharge status: Home / Self Care | Payer: Medicare Other | Attending: Internal Medicine | Admitting: Internal Medicine

## 2014-02-04 DIAGNOSIS — I10 Essential (primary) hypertension: Secondary | ICD-10-CM

## 2014-02-04 DIAGNOSIS — R05 Cough: Secondary | ICD-10-CM | POA: Diagnosis not present

## 2014-02-04 DIAGNOSIS — A0472 Enterocolitis due to Clostridium difficile, not specified as recurrent: Secondary | ICD-10-CM

## 2014-02-04 DIAGNOSIS — Z96651 Presence of right artificial knee joint: Secondary | ICD-10-CM | POA: Diagnosis not present

## 2014-02-04 DIAGNOSIS — A047 Enterocolitis due to Clostridium difficile: Secondary | ICD-10-CM

## 2014-02-04 DIAGNOSIS — R7611 Nonspecific reaction to tuberculin skin test without active tuberculosis: Secondary | ICD-10-CM | POA: Diagnosis not present

## 2014-02-06 NOTE — Progress Notes (Signed)
Patient ID: Sandra Williams, female   DOB: [birth year], 80 y.o.   MRN: [id number]               HISTORY & PHYSICAL  DATE:  02/04/2014                FACILITY: [facility]       LEVEL OF CARE:   SNF   CHIEF COMPLAINT:  Admission to SNF, post admit to hospital, 01/30/2014 through 02/02/2014, for an elective right total knee replacement.    HISTORY OF PRESENT ILLNESS:  This patient underwent the above-named procedure.  Postoperatively, she developed diarrhea.  Her stool was sent.  However, it was C.diff positive by PCR.  She was discharged on Flagyl 250 q.i.d. x10 days.  Otherwise, she appears to be doing well.    PAST MEDICAL HISTORY/PROBLEM LIST:                     Osteoporosis.    Osteoarthritis.    Hypertension.    Hyperlipidemia.    History of colon cancer of the cecum.    Gastroesophageal reflux disease.    Presentation of iron deficiency anemia at the time of colon cancer in 2014.    PAST SURGICAL HISTORY:                    Abdominal hysterectomy.    Cataract extractions.    Cesarean section.     Appendectomy.    Partial colectomy in October 2013.    Total knee arthroplasty on January 30, 2014.    CURRENT MEDICATIONS:  Discharge medications include:        Hydrocodone 5/325, 1 tablet q.4 p.r.n.      Cozaar 100 q.d.    Pravastatin 40 q.d.      Coumadin 2.5 q.d.    Probiotic 1 p.o. b.i.d.     Flagyl q.i.d. x10 days.     SOCIAL HISTORY:         HOUSING:  The patient tells me that she lives most of the time with her sister, some of the time with her daughter.   FUNCTIONAL STATUS:  She is functionally independent.  Does not drive.  Therefore, she is mostly with other people.  She does not use ambulatory assist devices.    FAMILY HISTORY:              MOTHER:  Hypertension in her mother.   SIBLINGS:  Cancer in a sister.   CHILDREN:  Breast cancer in a daughter.    REVIEW OF SYSTEMS:             CHEST/RESPIRATORY:  No shortness of  breath.  CARDIAC:   No chest pain.    GI:  Had a good bowel movement this morning.  Does not state she specifically has diarrhea.   GU:  Frequency, but no dysuria.   MUSCULOSKELETAL:  She does not have problems with her shoulders, nor her left knee.   PHYSICAL EXAMINATION:         VITAL SIGNS:   O2 SATURATIONS:  95% on room air.          RESPIRATIONS:  18 and unlabored.           PULSE:  67.   GENERAL APPEARANCE:   Very pleasant, alert woman in no distress.   CHEST/RESPIRATORY:  Clear air entry bilaterally.   CARDIOVASCULAR:  CARDIAC:   Heart sounds are normal.  There are no murmurs.  She appears to be euvolemic.   GASTROINTESTINAL:  ABDOMEN:   Multiple surgical scars.  Bowel sounds are quiet.  However, there is no tenderness.   LIVER/SPLEEN/KIDNEYS:  No liver, no spleen.   GENITOURINARY:  BLADDER:   Not enlarged.   MUSCULOSKELETAL:   EXTREMITIES:   RIGHT LOWER EXTREMITY:  Her right knee incision looks fine.  no evidence of infection.    ASSESSMENT/PLAN:                              Status post right total knee replacement.  She appears to be doing well.    Pseudomembranous colitis.  On Flagyl.  I am not exactly sure what her risk factors for this were.    I believe there is a drug interaction between Coumadin and Flagyl.  This will need to be carefully monitored.    Hypertension.  We will monitor while she is here.    Hyperlipidemia.  On Pravachol.  I see no reason to monitor this currently.     CPT CODE: 62831                                     ADDENDUM:  Due to the severity of the reaction between Flagyl and Coumadin, I am going to change her to oral vancomycin although she appears to be doing well.

## 2014-02-08 ENCOUNTER — Other Ambulatory Visit (HOSPITAL_COMMUNITY)
Admission: RE | Admit: 2014-02-08 | Discharge: 2014-02-08 | Disposition: A | Discharge status: Home / Self Care | Payer: Medicare Other | Source: Ambulatory Visit | Attending: Internal Medicine | Admitting: Internal Medicine

## 2014-02-08 LAB — PROTIME-INR
INR: 2.11 — ABNORMAL HIGH (ref 0.00–1.49)
Prothrombin Time: 23.8 seconds — ABNORMAL HIGH (ref 11.6–15.2)

## 2014-02-11 ENCOUNTER — Ambulatory Visit (INDEPENDENT_AMBULATORY_CARE_PROVIDER_SITE_OTHER): Payer: Self-pay | Admitting: Orthopedic Surgery

## 2014-02-11 ENCOUNTER — Other Ambulatory Visit (HOSPITAL_COMMUNITY)
Admission: RE | Admit: 2014-02-11 | Discharge: 2014-02-11 | Disposition: A | Discharge status: Home / Self Care | Payer: Medicare Other | Source: Ambulatory Visit | Attending: Internal Medicine | Admitting: Internal Medicine

## 2014-02-11 ENCOUNTER — Encounter (HOSPITAL_COMMUNITY)
Admission: RE | Admit: 2014-02-11 | Discharge: 2014-02-11 | Disposition: A | Discharge status: Home / Self Care | Payer: Medicare Other | Source: Skilled Nursing Facility | Attending: Internal Medicine | Admitting: Internal Medicine

## 2014-02-11 VITALS — BP 139/112 | Ht 63.0 in | Wt 145.0 lb

## 2014-02-11 DIAGNOSIS — Z96651 Presence of right artificial knee joint: Secondary | ICD-10-CM

## 2014-02-11 LAB — PROTIME-INR
INR: 2.02 — ABNORMAL HIGH (ref 0.00–1.49)
Prothrombin Time: 23.1 seconds — ABNORMAL HIGH (ref 11.6–15.2)

## 2014-02-11 MED ORDER — HYDROCODONE-ACETAMINOPHEN 5-325 MG PO TABS: 1.0000 | ORAL_TABLET | ORAL | Status: DC | PRN

## 2014-02-11 NOTE — Progress Notes (Signed)
Chief Complaint  Patient presents with  . Follow-up    post op 1, Right TKA, DOS 01/30/14   She had a right total knee she's doing reasonably well she's having trouble with extension but with some passive extension and gravity I got a to 5 full extension.  No swelling wound looks good and is ambulatory with therapy and a walker  Come back in 4 weeks continue hydrocodone for pain  Preop diagnosis osteoarthritis right knee  Postop diagnosis same  Procedure right total knee arthroplasty  Implants:  fixed-bearing posterior stabilized DepUY sigma knee replacement  Surgical findings:   The patient had a valgus knee with a flexion contracture and a preop passive range of motion of 95 under anesthesia, primarily lateral compartment disease once the joint was opened.

## 2014-02-12 ENCOUNTER — Non-Acute Institutional Stay (SKILLED_NURSING_FACILITY): Payer: Medicare Other | Admitting: Internal Medicine

## 2014-02-12 ENCOUNTER — Encounter: Payer: Self-pay | Admitting: Internal Medicine

## 2014-02-12 DIAGNOSIS — M25561 Pain in right knee: Secondary | ICD-10-CM

## 2014-02-12 DIAGNOSIS — A047 Enterocolitis due to Clostridium difficile: Secondary | ICD-10-CM

## 2014-02-12 DIAGNOSIS — M129 Arthropathy, unspecified: Secondary | ICD-10-CM | POA: Diagnosis not present

## 2014-02-12 DIAGNOSIS — M62838 Other muscle spasm: Secondary | ICD-10-CM

## 2014-02-12 DIAGNOSIS — A0472 Enterocolitis due to Clostridium difficile, not specified as recurrent: Secondary | ICD-10-CM

## 2014-02-12 DIAGNOSIS — M1711 Unilateral primary osteoarthritis, right knee: Secondary | ICD-10-CM

## 2014-02-12 NOTE — Progress Notes (Signed)
Patient ID: Sandra Williams, female   DOB: Sep 06, 1925, 81 y.o.   MRN: [id number]   This is an acute visit.  Level care skilled.  Facility [facility].  Chief complaint-acute visit secondary to left leg discomfort question spasm during therapy.  History of present illness.  Patient is a pleasant 81 year old female who is here for rehabilitation after undergoing a right knee replacement secondary to end-stage osteoarthritis.  She appears to have done well with her postop course.  She apparently developed diarrhea in the hospital stool was positive for C. difficile she was initially started on Flagyl and switched to vancomycin when she came to this facility secondary to concerns about interaction with her Coumadin.  She is on Coumadin it appears for DVT prophylaxis.  She has completed her course of vancomycin according to patient diarrhea has resolved she is now having some formed stool she does not complain of any abdominal discomfort \  In regards to knee issues her pain is relatively well controlled on Tylenol however with therapy apparently there is some increased discomfort i--therapy feels she is having some muscle spasms symptoms--she seems to agree with this.  Family medical social history as been reviewed per history and physical on 02/04/2014.  Medications have been reviewed per MAR.  Review of systems.  In general no complaints of fever or chills that she feels well.  Respiratory does not complain of any shortness of breath or cough.  Cardiac no chest pain.  GI-does not complaining of diarrhea as stated above has completed treatment for C. difficile.  GU does not complain of dysuria.  Musculoskeletal-as stated above apparently some increased pain with therapy.  Physical exam.  Temperature is 97.7 pulse 96 respirations 20 blood pressure 118/77.  General this is a pleasant elderly female in no distress sitting comfortably in her wheelchair.  Her skin is warm  and dry.  Chest is clear to auscultation no labored breathing.  Heart is regular rate and rhythm without murmur gallop or rub she does have some slight edema of her right leg with intact pedal pulse this is resolving according to staff and patient.  Abdomen is soft nontender she has a history of multiple surgical scars bowel sounds are positive although slightly hypoactive.  Musculoskeletal right knee incision is covered by Steri-Strips. Dry crusting I do not see any sign of infection or grossly increased erythema or warmth this appears to be healing unremarkably.  Labs.  02/11/2014-.  INR 2.0 2  02/04/2014.  WBC 7.0 hemoglobin 9.6 platelets 352.  Sodium 139 potassium 3.7 BUN 9 creatinine 0.71.  On January 30 hemoglobin was 9.0.   Assessment and plan.  #1-right leg muscle pain-as stated above-we will start Robaxin 500 mg 3 times a day as needed for suspected muscle pain and monitor-she also has hydrocodone 05/07/2023 every 4 hours as needed for her pain issues-.  #2-history C. difficile-this appears to be resolved no further complaints of diarrhea she's completed vancomycin will update a metabolic panel to insure stability as well as a CBC.  #3-anticoagulation management-INR is therapeutic but appears possibly be trending down we will update this later this week as well-currently on 2.5 mg Coumadin  [id number]  [id number]

## 2014-02-14 ENCOUNTER — Other Ambulatory Visit (HOSPITAL_COMMUNITY)
Admission: RE | Admit: 2014-02-14 | Discharge: 2014-02-14 | Disposition: A | Discharge status: Home / Self Care | Payer: Medicare Other | Source: Ambulatory Visit | Attending: Internal Medicine | Admitting: Internal Medicine

## 2014-02-14 ENCOUNTER — Encounter (HOSPITAL_COMMUNITY)
Admission: RE | Admit: 2014-02-14 | Discharge: 2014-02-14 | Disposition: A | Discharge status: Home / Self Care | Payer: Medicare Other | Source: Skilled Nursing Facility | Attending: Internal Medicine | Admitting: Internal Medicine

## 2014-02-14 LAB — BASIC METABOLIC PANEL
ANION GAP: 3 — AB (ref 5–15)
BUN: 18 mg/dL (ref 6–23)
CALCIUM: 8.8 mg/dL (ref 8.4–10.5)
CO2: 25 mmol/L (ref 19–32)
CREATININE: 0.73 mg/dL (ref 0.50–1.10)
Chloride: 110 mmol/L (ref 96–112)
GFR calc Af Amer: 87 mL/min — ABNORMAL LOW (ref >90)
GFR, EST NON AFRICAN AMERICAN: 75 mL/min — AB (ref >90)
Glucose, Bld: 94 mg/dL (ref 70–99)
POTASSIUM: 4.3 mmol/L (ref 3.5–5.1)
Sodium: 138 mmol/L (ref 135–145)

## 2014-02-14 LAB — CBC WITH DIFFERENTIAL/PLATELET
BASOS ABS: 0 10*3/uL (ref 0.0–0.1)
BASOS PCT: 1 % (ref 0–1)
EOS ABS: 0.2 10*3/uL (ref 0.0–0.7)
Eosinophils Relative: 3 % (ref 0–5)
HEMATOCRIT: 34.1 % — AB (ref 36.0–46.0)
Hemoglobin: 10.5 g/dL — ABNORMAL LOW (ref 12.0–15.0)
LYMPHS ABS: 1.3 10*3/uL (ref 0.7–4.0)
LYMPHS PCT: 21 % (ref 12–46)
MCH: 27.5 pg (ref 26.0–34.0)
MCHC: 30.8 g/dL (ref 30.0–36.0)
MCV: 89.3 fL (ref 78.0–100.0)
MONO ABS: 0.6 10*3/uL (ref 0.1–1.0)
Monocytes Relative: 9 % (ref 3–12)
Neutro Abs: 4.3 10*3/uL (ref 1.7–7.7)
Neutrophils Relative %: 66 % (ref 43–77)
PLATELETS: 651 10*3/uL — AB (ref 150–400)
RBC: 3.82 MIL/uL — AB (ref 3.87–5.11)
RDW: 14.8 % (ref 11.5–15.5)
WBC: 6.5 10*3/uL (ref 4.0–10.5)

## 2014-02-14 LAB — PROTIME-INR
INR: 2.13 — AB (ref 0.00–1.49)
Prothrombin Time: 24 seconds — ABNORMAL HIGH (ref 11.6–15.2)

## 2014-02-18 ENCOUNTER — Encounter (HOSPITAL_COMMUNITY)
Admission: RE | Admit: 2014-02-18 | Discharge: 2014-02-18 | Disposition: A | Discharge status: Home / Self Care | Payer: Medicare Other | Source: Skilled Nursing Facility | Attending: Internal Medicine | Admitting: Internal Medicine

## 2014-02-18 ENCOUNTER — Other Ambulatory Visit (HOSPITAL_COMMUNITY)
Admission: RE | Admit: 2014-02-18 | Discharge: 2014-02-18 | Disposition: A | Discharge status: Home / Self Care | Payer: Medicare Other | Source: Ambulatory Visit | Attending: Internal Medicine | Admitting: Internal Medicine

## 2014-02-18 LAB — PROTIME-INR
INR: 2.78 — AB (ref 0.00–1.49)
PROTHROMBIN TIME: 29.5 s — AB (ref 11.6–15.2)

## 2014-02-22 ENCOUNTER — Telehealth: Payer: Self-pay | Admitting: Orthopedic Surgery

## 2014-02-22 NOTE — Telephone Encounter (Signed)
Mavis from [facility] called to request order for an ice pack for some swelling, right knee -- patient is staus/post total knee surgery 01/30/14; her next scheduled appointment is 03/11/14.  Patient's nurse is Asa Lente, ph# [phone].

## 2014-02-22 NOTE — Telephone Encounter (Signed)
GAVE VERBAL OK FOR ICE TID PRN

## 2014-02-25 ENCOUNTER — Encounter: Payer: Self-pay | Admitting: Orthopedic Surgery

## 2014-02-25 ENCOUNTER — Encounter (HOSPITAL_COMMUNITY)
Admission: RE | Admit: 2014-02-25 | Discharge: 2014-02-25 | Disposition: A | Discharge status: Home / Self Care | Payer: Medicare Other | Source: Skilled Nursing Facility | Attending: Internal Medicine | Admitting: Internal Medicine

## 2014-02-25 LAB — PROTIME-INR
INR: 3.27 — ABNORMAL HIGH (ref 0.00–1.49)
Prothrombin Time: 33.6 seconds — ABNORMAL HIGH (ref 11.6–15.2)

## 2014-02-25 NOTE — Telephone Encounter (Signed)
good

## 2014-02-27 ENCOUNTER — Encounter (HOSPITAL_COMMUNITY)
Admission: RE | Admit: 2014-02-27 | Discharge: 2014-02-27 | Disposition: A | Discharge status: Home / Self Care | Payer: Medicare Other | Source: Skilled Nursing Facility | Attending: Internal Medicine | Admitting: Internal Medicine

## 2014-02-27 LAB — PROTIME-INR
INR: 2.28 — AB (ref 0.00–1.49)
Prothrombin Time: 25.3 seconds — ABNORMAL HIGH (ref 11.6–15.2)

## 2014-03-01 ENCOUNTER — Encounter (HOSPITAL_COMMUNITY)
Admission: RE | Admit: 2014-03-01 | Discharge: 2014-03-01 | Disposition: A | Discharge status: Home / Self Care | Payer: Medicare Other | Source: Skilled Nursing Facility | Attending: Internal Medicine | Admitting: Internal Medicine

## 2014-03-01 ENCOUNTER — Telehealth: Payer: Self-pay | Admitting: Orthopedic Surgery

## 2014-03-01 LAB — PROTIME-INR
INR: 2.05 — ABNORMAL HIGH (ref 0.00–1.49)
PROTHROMBIN TIME: 23.3 s — AB (ref 11.6–15.2)

## 2014-03-01 NOTE — Telephone Encounter (Signed)
ROUTING TO DR HARRISON 

## 2014-03-01 NOTE — Telephone Encounter (Signed)
Nurse calling from the [facility] stating that Sandra Williams Rt knee is swollen and warm to the touch, they are not sure what to do for her at this point DOS 01/30/14 TKA, please advise?

## 2014-03-04 ENCOUNTER — Encounter (HOSPITAL_COMMUNITY)
Admission: RE | Admit: 2014-03-04 | Discharge: 2014-03-04 | Disposition: A | Discharge status: Home / Self Care | Payer: Medicare Other | Source: Skilled Nursing Facility | Attending: Internal Medicine | Admitting: Internal Medicine

## 2014-03-04 LAB — PROTIME-INR
INR: 2.11 — ABNORMAL HIGH (ref 0.00–1.49)
Prothrombin Time: 23.9 seconds — ABNORMAL HIGH (ref 11.6–15.2)

## 2014-03-04 NOTE — Telephone Encounter (Signed)
Spoken to already  No fever no redness

## 2014-03-06 ENCOUNTER — Telehealth: Payer: Self-pay | Admitting: *Deleted

## 2014-03-06 NOTE — Telephone Encounter (Signed)
28 days post op

## 2014-03-06 NOTE — Telephone Encounter (Signed)
NURSE DELLA AWARE

## 2014-03-06 NOTE — Telephone Encounter (Signed)
How long do you want patient on Coumadin? Patient is ambulating.

## 2014-03-09 ENCOUNTER — Encounter: Payer: Self-pay | Admitting: Internal Medicine

## 2014-03-09 ENCOUNTER — Non-Acute Institutional Stay (SKILLED_NURSING_FACILITY): Payer: Medicare Other | Admitting: Internal Medicine

## 2014-03-09 DIAGNOSIS — M1711 Unilateral primary osteoarthritis, right knee: Secondary | ICD-10-CM | POA: Diagnosis not present

## 2014-03-09 DIAGNOSIS — I10 Essential (primary) hypertension: Secondary | ICD-10-CM

## 2014-03-09 DIAGNOSIS — R197 Diarrhea, unspecified: Secondary | ICD-10-CM | POA: Diagnosis not present

## 2014-03-09 NOTE — Progress Notes (Signed)
Patient ID: Sandra Williams, female   DOB: February 02, 1925, 81 y.o.   MRN: [id number]   This is a discharge note  Level care skilled.  Facility [facility].  Chief complain-discharge note.  History of present illness.  Patient is a pleasant 81 year old female who was here for rehabilitation after undergoing a right knee replacement secondary to end-stage osteoarthritis.  She appears to have done well with her postop course.  She apparently developed diarrhea in the hospital stool was positive for C. difficile she was initially started on Flagyl and switched to vancomycin when she came to this facility secondary to concerns about interaction with her Coumadin.   .  She has completed her course of vancomycin according to patient diarrhea has resolved --this has not really been an issue during her stay here.    \  In regards to knee issues her pain is relatively well controlled--she is receiving Vicodin as needed every 4 hours when necessary is also on a muscle relaxer. It appears orthopedics was contacted earlier this week about her Coumadin and this has been DC'd she has completed a four-week course.    She is ambulating with a walker will need continued PT and OT at home as an outpatient.  She has no complaints this afternoon is looking forward to going home she will be with her sister.     Family medical social history as been reviewed per history and physical on 02/04/2014 Previous history includes.  Osteoporosis.  Osteoarthritis.  Hypertension.  Hyperlipidemia.  History of colon cancer of the cecum.  GERD.  Presentation of iron deficiency anemia at time of colon cancer in 2014.  Past surgical history.  Abdominal hysterectomy.  Cataract extractions.  Cesarean section.  Appendectomy.  Partial colectomy in October 2013.  Total knee arthroplasty on 01/30/2014.  Current medications.  Colace 100 mg daily.  Vicodin 05/07/2023 milligrams every 4 hours  when necessary.  Losartan 100 mg every morning.  MiraLAX 1 stay when necessary.  Pravastatin 40 mg daily at bedtime.  Robaxin 500 mg every 8 hours  Social history.  Patient lives with her sister.  Was functionally independent does not drive.  Family history.  History of hypertension in her mother-cancer in a sister-I daughter with a history of breast cancer. Per  Review of systems.  In general no complaints of fever or chills that she feels well.  Respiratory does not complain of any shortness of breath or cough.  Cardiac no chest pain.  GI-does not complaining of diarrhea as stated above has completed treatment for C. difficile.  GU does not complain of dysuria.  Musculoskeletal-any joint pain appears to be controlled with the when necessary Vicodin as well as the muscle relaxer.  Neurologic does not complain of any dizziness or headache or single-type feelings.  GI says she has good appetite denies any abdominal discomfort nausea vomiting diarrhea or constipation.  Psych-does not complain of depression or anxiety  Physical exam.  Temperature 98.5 pulse 70 respirations 20 blood pressure 133/66  General this is a pleasant elderly female in no distress sitting comfortably in her wheelchair.  Her skin is warm and dry--surgical site right knee appears well healed with dry crusting.  Chest is clear to auscultation no labored breathing.  Heart is regular rate and rhythm without murmur gallop or rub--edema of right leg appears to be improved--very minimal except at the knee area which also appears to be improved there is a vigorous pedal pulse  Abdomen is soft nontender she has  a history of multiple surgical scars bowel sounds are positive   Musculoskeletal --right knee surgical site appears to be healing unremarkably there is still some edema but this is improved very minimal warmth is appears to be quite benign.  She is able to stand without assistance but however  is somewhat weak with attempts at ambulation will need an assistance device--.  Strength appears to be intact on 4 extremities  Neurologic is grossly intact her speech is clear no lateralizing findings.  Psych she is alert and oriented pleasant and appropriate .  Labs.  02/14/2014.  Sodium 138 potassium 4.3 BUN 18 creatinine 0.73.  WBC 6.5 hemoglobin 10.5 platelets 651  02/11/2014-.  INR 2.0 2  02/04/2014.  WBC 7.0 hemoglobin 9.6 platelets 352.  Sodium 139 potassium 3.7 BUN 9 creatinine 0.71.  On January 30 hemoglobin was 9.0.   Assessment and plan.  #1-.History of right knee end-stage osteoarthritis status post replacement-this appears to have had a fairly benign postop course she is doing well with therapy would benefit from continued outpatient PT and OT-pain is controlled with Vicodin and muscle relaxer.  Per chart review it appears she is no longer on Coumadin-since she previously had been on aspirin it appears will restart low-dose aspirin-she is ambulatory  #2-history C. difficile-this appears to be resolved no further complaints of diarrhea she's completed vancomycin will update a metabolic panel to insure stability as well as a CBC.  # #3-history of hypertension this appears to have been well controlled during her stay here recent blood pressures 133/66-127/45 these appear relatively baseline she is on Losartan  History of hyperlipidemia she continues on pravastatin will defer to primary care provider for aggressive follow-up of this secondary to her short stay here   #5-history of mild thrombocytosis-per recent lab with platelets over 600,000 we'll recheck this as suspect this was probably reactive  CPT-99 316-of note greater than 30 minutes spent on this discharge summary

## 2014-03-11 ENCOUNTER — Ambulatory Visit (INDEPENDENT_AMBULATORY_CARE_PROVIDER_SITE_OTHER): Payer: Self-pay | Admitting: Orthopedic Surgery

## 2014-03-11 ENCOUNTER — Encounter (HOSPITAL_COMMUNITY)
Admission: RE | Admit: 2014-03-11 | Discharge: 2014-03-11 | Disposition: A | Discharge status: Home / Self Care | Payer: Medicare Other | Source: Skilled Nursing Facility | Attending: Internal Medicine | Admitting: Internal Medicine

## 2014-03-11 ENCOUNTER — Encounter: Payer: Self-pay | Admitting: Orthopedic Surgery

## 2014-03-11 VITALS — BP 128/51 | Ht 63.0 in | Wt 145.0 lb

## 2014-03-11 DIAGNOSIS — Z96651 Presence of right artificial knee joint: Secondary | ICD-10-CM

## 2014-03-11 LAB — CBC WITH DIFFERENTIAL/PLATELET
Basophils Absolute: 0 10*3/uL (ref 0.0–0.1)
Basophils Relative: 1 % (ref 0–1)
Eosinophils Absolute: 0.2 10*3/uL (ref 0.0–0.7)
Eosinophils Relative: 4 % (ref 0–5)
HEMATOCRIT: 34.8 % — AB (ref 36.0–46.0)
HEMOGLOBIN: 10.6 g/dL — AB (ref 12.0–15.0)
LYMPHS ABS: 2 10*3/uL (ref 0.7–4.0)
Lymphocytes Relative: 41 % (ref 12–46)
MCH: 27.1 pg (ref 26.0–34.0)
MCHC: 30.5 g/dL (ref 30.0–36.0)
MCV: 89 fL (ref 78.0–100.0)
MONO ABS: 0.5 10*3/uL (ref 0.1–1.0)
Monocytes Relative: 9 % (ref 3–12)
Neutro Abs: 2.2 10*3/uL (ref 1.7–7.7)
Neutrophils Relative %: 45 % (ref 43–77)
Platelets: 334 10*3/uL (ref 150–400)
RBC: 3.91 MIL/uL (ref 3.87–5.11)
RDW: 14.9 % (ref 11.5–15.5)
WBC: 5 10*3/uL (ref 4.0–10.5)

## 2014-03-11 LAB — PROTIME-INR
INR: 1.19 (ref 0.00–1.49)
Prothrombin Time: 15.2 seconds (ref 11.6–15.2)

## 2014-03-11 LAB — BASIC METABOLIC PANEL
ANION GAP: 5 (ref 5–15)
BUN: 16 mg/dL (ref 6–23)
CO2: 28 mmol/L (ref 19–32)
Calcium: 9 mg/dL (ref 8.4–10.5)
Chloride: 109 mmol/L (ref 96–112)
Creatinine, Ser: 0.73 mg/dL (ref 0.50–1.10)
GFR calc Af Amer: 87 mL/min — ABNORMAL LOW (ref >90)
GFR, EST NON AFRICAN AMERICAN: 75 mL/min — AB (ref >90)
GLUCOSE: 94 mg/dL (ref 70–99)
POTASSIUM: 4.6 mmol/L (ref 3.5–5.1)
SODIUM: 142 mmol/L (ref 135–145)

## 2014-03-11 MED ORDER — HYDROCODONE-ACETAMINOPHEN 5-325 MG PO TABS: 1.0000 | ORAL_TABLET | Freq: Four times a day (QID) | ORAL | Status: DC | PRN

## 2014-03-11 NOTE — Progress Notes (Signed)
Chief Complaint  Patient presents with  . Follow-up    post op 2, Right knee, TKA 01/30/14   Encounter Diagnosis  Name Primary?  . Status post right knee replacement Yes    Slight flexion contracture knee flexion 110 doing well walking well without supportive devices  Has some pain with extension  Recommend outpatient therapy follow-up 5 weeks

## 2014-03-11 NOTE — Patient Instructions (Signed)
CALL [facility] THERAPY DEPT TO ARRANGE THERAPY

## 2014-03-13 ENCOUNTER — Encounter (HOSPITAL_COMMUNITY): Payer: Self-pay | Admitting: Physical Therapy

## 2014-03-13 ENCOUNTER — Other Ambulatory Visit: Payer: Self-pay

## 2014-03-13 ENCOUNTER — Ambulatory Visit (HOSPITAL_COMMUNITY): Payer: Medicare Other | Attending: Internal Medicine | Admitting: Physical Therapy

## 2014-03-13 DIAGNOSIS — M6281 Muscle weakness (generalized): Secondary | ICD-10-CM | POA: Insufficient documentation

## 2014-03-13 DIAGNOSIS — Z471 Aftercare following joint replacement surgery: Secondary | ICD-10-CM | POA: Insufficient documentation

## 2014-03-13 DIAGNOSIS — R269 Unspecified abnormalities of gait and mobility: Secondary | ICD-10-CM | POA: Diagnosis not present

## 2014-03-13 DIAGNOSIS — M25561 Pain in right knee: Secondary | ICD-10-CM

## 2014-03-13 DIAGNOSIS — R29898 Other symptoms and signs involving the musculoskeletal system: Secondary | ICD-10-CM

## 2014-03-13 DIAGNOSIS — Z96651 Presence of right artificial knee joint: Secondary | ICD-10-CM | POA: Diagnosis not present

## 2014-03-13 MED ORDER — HYDROCODONE-ACETAMINOPHEN 5-325 MG PO TABS: 1.0000 | ORAL_TABLET | ORAL | Status: DC | PRN

## 2014-03-13 NOTE — Therapy (Signed)
[place], [place], [address] Phone: [phone]   Fax:  [phone]  Physical Therapy Evaluation  Patient Details  Name: Sandra Williams MRN: [id number] Date of Birth: 16-Nov-1925 Referring Provider:  Ricard Dillon, MD  Encounter Date: 03/13/2014      PT End of Session - 03/13/14 1155    Visit Number 1   Number of Visits 12   Date for PT Re-Evaluation 04/10/14   Authorization Type Medicare   Authorization Time Period 03/13/14 to 05/13/14   Authorization - Visit Number 1   Authorization - Number of Visits 10   Activity Tolerance Patient tolerated treatment well   Behavior During Therapy [facility] for tasks assessed/performed      Past Medical History  Diagnosis Date  . Osteoporosis   . Arthritis   . HTN (hypertension)   . Hyperlipidemia   . Cancer     cecal carcinoma  . GERD (gastroesophageal reflux disease)   . Colon cancer 02/01/2012    Stage I (T2, N0, M0) cancer the cecum status post resection by Dr. Arnoldo Morale with 14 lymph nodes found all of which were negative. She had no evidence for metastatic disease on CT scan of the abdomen and pelvis either. Her date of surgery was 10/22/2011.  Not in need of chemotherapy for her stage I cancer.   . Iron deficiency anemia 02/01/2012    At time of colon cancer presentation  . Knee joint pain 2014    right    Past Surgical History  Procedure Laterality Date  . Abdominal hysterectomy      partial  . Cataract extraction Bilateral   . Cesarean section    . Dental implant    . Appendectomy      [facility]  . Partial colectomy  10/22/2011    Procedure: PARTIAL COLECTOMY;  Surgeon: Jamesetta So, MD;  Location: [facility];  Service: General;  Laterality: N/A;  . Colon surgery    . Total knee arthroplasty Right 01/30/2014    Procedure: TOTAL KNEE ARTHROPLASTY;  Surgeon: Carole Civil, MD;  Location: [facility];  Service: Orthopedics;  Laterality: Right;    There were no  vitals taken for this visit.  Visit Diagnosis:  Pain in right knee - Plan: PT plan of care cert/re-cert  Status post total right knee replacement - Plan: PT plan of care cert/re-cert  Abnormality of gait - Plan: PT plan of care cert/re-cert  Weakness of both legs - Plan: PT plan of care cert/re-cert      Subjective Assessment - 03/13/14 1110    Symptoms Patient states knee feels alright, can walk without pain; slight cramping every now and then   Pertinent History Patient had a lot of pain bending knee; eventually got surgery   How long can you sit comfortably? Unlimited   How long can you stand comfortably? 30 minutes   How long can you walk comfortably? No pain during walking   Currently in Pain? Yes   Pain Score 4    Pain Location Knee   Pain Orientation Right          [facility] PT Assessment - 03/13/14 0001    Assessment   Medical Diagnosis s/p R TKR   Onset Date 02/13/14  approximate date   Next MD Visit Pt cannot recall   Balance Screen   Has the patient fallen in the past 6 months No   Has the patient had a decrease in activity  level because of a fear of falling?  No   Is the patient reluctant to leave their home because of a fear of falling?  No   AROM   Right Hip External Rotation  37   Right Hip Internal Rotation  20   Left Hip External Rotation  42   Left Hip Internal Rotation  18   Right Knee Extension 14  patient had hard time relaxing for measure   Right Knee Flexion 98   Right Ankle Dorsiflexion 9   Left Ankle Dorsiflexion 13   Strength   Right Hip Flexion 3/5   Right Hip Extension 2/5   Right Hip ABduction 3/5   Left Hip Flexion 3+/5   Left Hip Extension 2/5   Left Hip ABduction 3+/5   Right Knee Flexion 4/5   Right Knee Extension 4/5   Left Knee Flexion 4-/5   Left Knee Extension 4+/5   Right Ankle Dorsiflexion 4/5   Left Ankle Dorsiflexion 4+/5                          PT Education - 03/13/14 1154    Education provided Yes    Education Details Prognosis, HEP   Person(s) Educated Patient   Methods Explanation   Comprehension Verbalized understanding;Returned demonstration          PT Short Term Goals - 03/13/14 1158    PT SHORT TERM GOAL #1   Title Patient will display R knee range of motion of at least 7 degrees extension to 110 degrees flexion with pain 0/10   Time 2   Period Weeks   Status New   PT SHORT TERM GOAL #2   Title Patient will demonstrate an increase of at least one muscle grade in all tested muscle groups in order to improve knee stability and improve overall function   Time 2   Period Weeks   Status New   PT SHORT TERM GOAL #3   Title Patient will demonstrate improved TKE during gait over stable and unstable surfaces without AD   Time 2   Period Weeks   Status New   PT SHORT TERM GOAL #4   Title Patient will be able to tolerate standing tasks for at least 90 minutes with pain no more than 2/10    Time 2   Period Weeks   Status New           PT Long Term Goals - 03/13/14 1201    PT LONG TERM GOAL #1   Title Patient will be independent in HEP and will perform program correctly and on consistent basis   Time 4   Period Weeks   Status New   PT LONG TERM GOAL #2   Title Patient will demonstrate R knee extension of no more than 3 degrees and knee flexion of at least 115 degrees   Time 4   Period Weeks   Status New   PT LONG TERM GOAL #3   Title Patient will demonstrate the abilty to reciprocally ascend/descend full flight of standard stairs with U railing and no circumduction, pain  R knee no more than 1/10   Time 4   Period Weeks   Status New   PT LONG TERM GOAL #4   Title Patient will demonstrate the ability to perform standing tasks for at least 3 hours with pain right knee no more than 1/10 in order to enhance ability to participate in functional  tasks   Time 4   Period Weeks   Status New   PT LONG TERM GOAL #5   Title Patient will demonstrate the ability to  ambulate unlimited distances over even and uneven surfaces, no AD, with pain right knee no more than 1/10   Time 4   Period Weeks   Status New               Plan - 04/09/14 1155    Clinical Impression Statement Patient presents with R knee pain, impaired gait mechanics, reduced strength, reduced soft tissue mobiltiy, and reduced range of motion s/p R total knee replacement. Patient does appear to have poor memory and is slow learner, appeared to respond well to physical demonstration of tasks. Patient will benefit from skilled PT services in order to address these  impairments, as well as to assist her in reaching optimal level of pain free function.    Pt will benefit from skilled therapeutic intervention in order to improve on the following deficits Abnormal gait;Impaired perceived functional ability;Improper body mechanics;Decreased activity tolerance;Decreased strength;Impaired flexibility;Postural dysfunction;Decreased balance;Decreased mobility;Decreased range of motion;Increased edema;Pain;Decreased coordination;Decreased safety awareness   Rehab Potential Good   PT Frequency 3x / week   PT Duration 4 weeks   PT Treatment/Interventions ADLs/Self Care Home Management;Gait training;Neuromuscular re-education;Stair training;Functional mobility training;Patient/family education;Cryotherapy;Therapeutic activities;Therapeutic exercise;Manual techniques;Balance training   PT Next Visit Plan functional stretches and strengthening, address limited hip mobility   PT Home Exercise Plan given   Consulted and Agree with Plan of Care Patient          G-Codes - April 09, 2014 1205    Functional Assessment Tool Used Skilled clinical assessment and judgement   Functional Limitation Mobility: Walking and moving around   Mobility: Walking and Moving Around Current Status [phone]) At least 40 percent but less than 60 percent impaired, limited or restricted   Mobility: Walking and Moving Around Goal  Status [phone]) At least 20 percent but less than 40 percent impaired, limited or restricted       Problem List Patient Active Problem List   Diagnosis Date Noted  . Muscle spasm 02/12/2014  . Arthritis of knee, right 01/30/2014  . Osteoarthritis   . Rotator cuff tear arthropathy 12/11/2013  . Sciatica 10/03/2013  . Primary osteoarthritis of right knee 10/03/2013  . GERD (gastroesophageal reflux disease) 10/23/2012  . Chest pain 10/22/2012  . OA (osteoarthritis) of knee 06/27/2012  . Colon cancer 02/01/2012  . Iron deficiency anemia 02/01/2012  . MEMORY LOSS 08/15/2008  . KNEE PAIN, RIGHT 08/12/2008  . CONSTIPATION 03/01/2008  . Spinal stenosis of lumbar region 03/01/2008  . HYPERTENSION, BENIGN ESSENTIAL 01/19/2008  . SHOULDER PAIN, RIGHT 12/12/2007  . POSTMENOPAUSAL OSTEOPOROSIS 12/12/2006  . HYPERLIPIDEMIA 10/07/2006  . OVERACTIVE BLADDER 10/07/2006  . ARTHRITIS 10/07/2006    Deniece Ree PT, DPT [facility] [address] [place], [place], [address] Phone: [phone]   Fax:  [phone]

## 2014-03-13 NOTE — Patient Instructions (Signed)
ABDUCTION: Standing (Active)   Stand, feet flat. Lift right leg out to side. Use _0__ lbs. Complete _1__ sets of _10__ repetitions. Perform _2__ sessions per day.  http://gtsc.exer.us/111   Copyright  VHI. All rights reserved.  EXTENSION: Standing (Active)   Stand, both feet flat. Draw right leg behind body as far as possible. Use _0__ lbs. Complete _1__ sets of _10__ repetitions. Perform _2__ sessions per day.  http://gtsc.exer.us/77   Copyright  VHI. All rights reserved.  Hamstring Curl   Hold onto kitchen counter. Shift weight onto right leg, while bending left knee. Repeat, switching legs. Repeat _10__ times, alternating legs. Do _2__ times per day.  Copyright  VHI. All rights reserved.  Achilles Tendon Stretch   Stand with hands supported on wall, elbows slightly bent, feet parallel and both heels on floor, front knee bent, back knee straight. Slowly relax back knee until a stretch is felt in achilles tendon. Hold __30__ seconds. Repeat with leg positions switched. Do this twice, two times per day.   Copyright  VHI. All rights reserved.  Hamstring Stretch   Place sound foot on the floor, keep back and residual limb knee straight. Lean forward until a stretch is felt in back of residual limb thigh. Hold _30___ seconds. Repeat __2__ times. Do _2___ sessions per day.  Copyright  VHI. All rights reserved.

## 2014-03-13 NOTE — Telephone Encounter (Signed)
RX faxed to [facility] @ [phone]. Phone number [phone]  

## 2014-03-27 ENCOUNTER — Ambulatory Visit (HOSPITAL_COMMUNITY): Payer: Medicare Other | Admitting: Physical Therapy

## 2014-03-27 DIAGNOSIS — Z96651 Presence of right artificial knee joint: Secondary | ICD-10-CM

## 2014-03-27 DIAGNOSIS — R269 Unspecified abnormalities of gait and mobility: Secondary | ICD-10-CM

## 2014-03-27 DIAGNOSIS — M25561 Pain in right knee: Secondary | ICD-10-CM | POA: Diagnosis not present

## 2014-03-27 DIAGNOSIS — M6281 Muscle weakness (generalized): Secondary | ICD-10-CM | POA: Diagnosis not present

## 2014-03-27 DIAGNOSIS — R29898 Other symptoms and signs involving the musculoskeletal system: Secondary | ICD-10-CM

## 2014-03-27 DIAGNOSIS — Z471 Aftercare following joint replacement surgery: Secondary | ICD-10-CM | POA: Diagnosis not present

## 2014-03-27 NOTE — Therapy (Addendum)
Sandra Williams, [place], [address] Phone: [phone]   Fax:  [phone]  Physical Therapy Treatment  Patient Details  Name: Sandra Williams MRN: [id number] Date of Birth: 1925-05-25 Referring Provider:  Rosita Fire, MD  Encounter Date: 03/27/2014      PT End of Session - 03/27/14 1150    Visit Number 2   Number of Visits 12   Date for PT Re-Evaluation 04/10/14   Authorization Type Medicare   Authorization Time Period 03/13/14 to 05/13/14   Authorization - Visit Number 2   Authorization - Number of Visits 10   PT Start Time 1114   PT Stop Time 1153   PT Time Calculation (min) 39 min      Past Medical History  Diagnosis Date  . Osteoporosis   . Arthritis   . HTN (hypertension)   . Hyperlipidemia   . Cancer     cecal carcinoma  . GERD (gastroesophageal reflux disease)   . Colon cancer 02/01/2012    Stage I (T2, N0, M0) cancer the cecum status post resection by Dr. Arnoldo Williams with 14 lymph nodes found all of which were negative. She had no evidence for metastatic disease on CT scan of the abdomen and pelvis either. Her date of surgery was 10/22/2011.  Not in need of chemotherapy for her stage I cancer.   . Iron deficiency anemia 02/01/2012    At time of colon cancer presentation  . Knee joint pain 2014    right    Past Surgical History  Procedure Laterality Date  . Abdominal hysterectomy      partial  . Cataract extraction Bilateral   . Cesarean section    . Dental implant    . Appendectomy      [facility]  . Partial colectomy  10/22/2011    Procedure: PARTIAL COLECTOMY;  Surgeon: Sandra So, MD;  Location: [facility];  Service: General;  Laterality: N/A;  . Colon surgery    . Total knee arthroplasty Right 01/30/2014    Procedure: TOTAL KNEE ARTHROPLASTY;  Surgeon: Sandra Civil, MD;  Location: [facility];  Service: Orthopedics;  Laterality: Right;    There were no vitals filed for this visit.  Visit  Diagnosis:  Status post total right knee replacement  Abnormality of gait  Weakness of both legs  Pain in right knee      Subjective Assessment - 03/27/14 1115    Symptoms Pt states that she is doing her exercises every day.     Currently in Pain? Yes   Pain Score 4    Pain Location Knee   Pain Orientation Right               [facility] Adult PT Treatment/Exercise - 03/27/14 0001    Exercises   Exercises Knee/Hip   Knee/Hip Exercises: Stretches   Passive Hamstring Stretch 3 reps;30 seconds   Passive Hamstring Stretch Limitations 14" box    Gastroc Stretch 3 reps;30 seconds   Gastroc Stretch Limitations slant board    Knee/Hip Exercises: Standing   Heel Raises 10 reps   Knee Flexion Strengthening;Right;10 reps   Lateral Step Up Right;10 reps;Step Height: 4"   Forward Step Up Right;10 reps;Step Height: 4"   Functional Squat 10 reps   Rocker Board 2 minutes   SLS x 10" x 5   Knee/Hip Exercises: Seated   Long Arc Quad Right;10 reps   Knee/Hip Exercises: Supine   Quad Sets Right;10 reps  Heel Slides Strengthening;Right;10 reps   Terminal Knee Extension Strengthening;Right;10 reps   Knee Extension PROM             PT Education - 03/27/14 1152    Methods Handout;Explanation for quad sets to improve extension          PT Short Term Goals - 03/27/14 1201    PT SHORT TERM GOAL #1   Title Patient will display R knee range of motion of at least 7 degrees extension to 110 degrees flexion with pain 0/10   Time 2   Period Weeks   Status On-going   PT SHORT TERM GOAL #2   Title Patient will demonstrate an increase of at least one muscle grade in all tested muscle groups in order to improve knee stability and improve overall function   Time 2   Period Weeks   Status On-going   PT SHORT TERM GOAL #3   Title Patient will demonstrate improved TKE during gait over stable and unstable surfaces without AD   Time 2   Period Weeks   Status On-going   PT SHORT TERM  GOAL #4   Title Patient will be able to tolerate standing tasks for at least 90 minutes with pain no more than 2/10    Time 2   Period Weeks   Status On-going           PT Long Term Goals - 03/27/14 1202    PT LONG TERM GOAL #1   Title Patient will be independent in HEP and will perform program correctly and on consistent basis   Time 4   Period Weeks   Status On-going   PT LONG TERM GOAL #2   Title Patient will demonstrate R knee extension of no more than 3 degrees and knee flexion of at least 115 degrees   Time 4   Period Weeks   Status On-going   PT LONG TERM GOAL #3   Title Patient will demonstrate the abilty to reciprocally ascend/descend full flight of standard stairs with U railing and no circumduction, pain  R knee no more than 1/10   Time 4   Period Weeks   Status On-going   PT LONG TERM GOAL #4   Title Patient will demonstrate the ability to perform standing tasks for at least 3 hours with pain right knee no more than 1/10 in order to enhance ability to participate in functional tasks   Time 4   Period Weeks   Status On-going   PT LONG TERM GOAL #5   Title Patient will demonstrate the ability to ambulate unlimited distances over even and uneven surfaces, no AD, with pain right knee no more than 1/10   Time 4   Period Weeks   Status On-going               Plan - 03/27/14 1153    Clinical Impression Statement [name] has improved with her ROM from 14-98 at eval to 12-108 today.  Treatment session focused on ROM, strength and balance.  Pt encouraged to work on extension at home.    PT Next Visit Plan begin lunges next treatment.    PT Home Exercise Plan given   Consulted and Agree with Plan of Care Patient        Problem List Patient Active Problem List   Diagnosis Date Noted  . Muscle spasm 02/12/2014  . Arthritis of knee, right 01/30/2014  . Osteoarthritis   . Rotator cuff  tear arthropathy 12/11/2013  . Sciatica 10/03/2013  . Primary  osteoarthritis of right knee 10/03/2013  . GERD (gastroesophageal reflux disease) 10/23/2012  . Chest pain 10/22/2012  . OA (osteoarthritis) of knee 06/27/2012  . Colon cancer 02/01/2012  . Iron deficiency anemia 02/01/2012  . MEMORY LOSS 08/15/2008  . KNEE PAIN, RIGHT 08/12/2008  . CONSTIPATION 03/01/2008  . Spinal stenosis of lumbar region 03/01/2008  . HYPERTENSION, BENIGN ESSENTIAL 01/19/2008  . SHOULDER PAIN, RIGHT 12/12/2007  . POSTMENOPAUSAL OSTEOPOROSIS 12/12/2006  . HYPERLIPIDEMIA 10/07/2006  . OVERACTIVE BLADDER 10/07/2006  . ARTHRITIS 10/07/2006    Rayetta Humphrey PT [phone] 03/27/2014, 12:02 PM  [facility] [address] [place], [place], [address] Phone: [phone]   Fax:  [phone]

## 2014-03-27 NOTE — Patient Instructions (Signed)
Strengthening: Quadriceps Set   Tighten muscles on top of thighs by pushing knees down into surface. Hold ____ seconds. Repeat ____ times per set. Do ____ sets per session. Do ____ sessions per day.  http://orth.exer.us/602   Copyright  VHI. All rights reserved.

## 2014-04-02 ENCOUNTER — Ambulatory Visit (HOSPITAL_COMMUNITY): Payer: Medicare Other | Admitting: Physical Therapy

## 2014-04-02 DIAGNOSIS — Z96651 Presence of right artificial knee joint: Secondary | ICD-10-CM

## 2014-04-02 DIAGNOSIS — M25561 Pain in right knee: Secondary | ICD-10-CM

## 2014-04-02 DIAGNOSIS — Z471 Aftercare following joint replacement surgery: Secondary | ICD-10-CM | POA: Diagnosis not present

## 2014-04-02 DIAGNOSIS — R269 Unspecified abnormalities of gait and mobility: Secondary | ICD-10-CM | POA: Diagnosis not present

## 2014-04-02 DIAGNOSIS — M6281 Muscle weakness (generalized): Secondary | ICD-10-CM | POA: Diagnosis not present

## 2014-04-02 DIAGNOSIS — R29898 Other symptoms and signs involving the musculoskeletal system: Secondary | ICD-10-CM

## 2014-04-02 NOTE — Therapy (Signed)
[place], [place], [address] Phone: [phone]   Fax:  [phone]  Physical Therapy Treatment  Patient Details  Name: Sandra Williams MRN: [id number] Date of Birth: 1925-03-14 Referring Provider:  Ricard Dillon, MD  Encounter Date: 04/02/2014      PT End of Session - 04/02/14 1153    Visit Number 3   Number of Visits 12   Date for PT Re-Evaluation 04/10/14   Authorization Type Medicare   Authorization Time Period 03/13/14 to 05/13/14   Authorization - Visit Number 3   Authorization - Number of Visits 10   PT Start Time 1749   PT Stop Time 1151   PT Time Calculation (min) 48 min   Activity Tolerance Patient tolerated treatment well   Behavior During Therapy [facility] for tasks assessed/performed      Past Medical History  Diagnosis Date  . Osteoporosis   . Arthritis   . HTN (hypertension)   . Hyperlipidemia   . Cancer     cecal carcinoma  . GERD (gastroesophageal reflux disease)   . Colon cancer 02/01/2012    Stage I (T2, N0, M0) cancer the cecum status post resection by Dr. Arnoldo Morale with 14 lymph nodes found all of which were negative. She had no evidence for metastatic disease on CT scan of the abdomen and pelvis either. Her date of surgery was 10/22/2011.  Not in need of chemotherapy for her stage I cancer.   . Iron deficiency anemia 02/01/2012    At time of colon cancer presentation  . Knee joint pain 2014    right    Past Surgical History  Procedure Laterality Date  . Abdominal hysterectomy      partial  . Cataract extraction Bilateral   . Cesarean section    . Dental implant    . Appendectomy      [facility]  . Partial colectomy  10/22/2011    Procedure: PARTIAL COLECTOMY;  Surgeon: Jamesetta So, MD;  Location: [facility];  Service: General;  Laterality: N/A;  . Colon surgery    . Total knee arthroplasty Right 01/30/2014    Procedure: TOTAL KNEE ARTHROPLASTY;  Surgeon: Carole Civil, MD;   Location: [facility];  Service: Orthopedics;  Laterality: Right;    There were no vitals filed for this visit.  Visit Diagnosis:  Status post total right knee replacement  Abnormality of gait  Weakness of both legs  Pain in right knee      Subjective Assessment - 04/02/14 1107    Symptoms Patient states she is doing well today, had a good relaxing holiday weekend. No pain in her knee, mostly just stiffness.    Pertinent History Patient had a lot of pain bending knee; eventually got surgery   Currently in Pain? No/denies                       [facility] Adult PT Treatment/Exercise - 04/02/14 0001    Knee/Hip Exercises: Stretches   Passive Hamstring Stretch 3 reps;30 seconds   Passive Hamstring Stretch Limitations 14" box    Quad Stretch 2 reps;30 seconds   Quad Stretch Limitations prone with rope   Piriformis Stretch 2 reps;30 seconds   Piriformis Stretch Limitations seated   Gastroc Stretch 3 reps;30 seconds   Gastroc Stretch Limitations slant board   Knee/Hip Exercises: Standing   Heel Raises 1 set;10 reps   Heel Raises Limitations slantboard   Forward Lunges  Both;1 set;10 reps   Forward Lunges Limitations 4 inch box   Side Lunges Both;1 set;10 reps   Side Lunges Limitations 4 inch box   Functional Squat 1 set;10 reps   Functional Squat Limitations sit to stand, no hands   Rocker Board 4 minutes   Rocker Board Limitations 2 minutes A-P, 2 minutes lateral   Other Standing Knee Exercises 3D hip excursions 1x10   Other Standing Knee Exercises Hip IR box walks 1x5 each side                PT Education - 04/02/14 1152    Education provided Yes   Education Details Educated to  continue elevating and icing ankle when it does swell   Person(s) Educated Patient   Methods Explanation   Comprehension Verbalized understanding          PT Short Term Goals - 03/27/14 1201    PT SHORT TERM GOAL #1   Title Patient will display R knee range of motion of at  least 7 degrees extension to 110 degrees flexion with pain 0/10   Time 2   Period Weeks   Status On-going   PT SHORT TERM GOAL #2   Title Patient will demonstrate an increase of at least one muscle grade in all tested muscle groups in order to improve knee stability and improve overall function   Time 2   Period Weeks   Status On-going   PT SHORT TERM GOAL #3   Title Patient will demonstrate improved TKE during gait over stable and unstable surfaces without AD   Time 2   Period Weeks   Status On-going   PT SHORT TERM GOAL #4   Title Patient will be able to tolerate standing tasks for at least 90 minutes with pain no more than 2/10    Time 2   Period Weeks   Status On-going           PT Long Term Goals - 03/27/14 1202    PT LONG TERM GOAL #1   Title Patient will be independent in HEP and will perform program correctly and on consistent basis   Time 4   Period Weeks   Status On-going   PT LONG TERM GOAL #2   Title Patient will demonstrate R knee extension of no more than 3 degrees and knee flexion of at least 115 degrees   Time 4   Period Weeks   Status On-going   PT LONG TERM GOAL #3   Title Patient will demonstrate the abilty to reciprocally ascend/descend full flight of standard stairs with U railing and no circumduction, pain  R knee no more than 1/10   Time 4   Period Weeks   Status On-going   PT LONG TERM GOAL #4   Title Patient will demonstrate the ability to perform standing tasks for at least 3 hours with pain right knee no more than 1/10 in order to enhance ability to participate in functional tasks   Time 4   Period Weeks   Status On-going   PT LONG TERM GOAL #5   Title Patient will demonstrate the ability to ambulate unlimited distances over even and uneven surfaces, no AD, with pain right knee no more than 1/10   Time 4   Period Weeks   Status On-going               Plan - 04/02/14 1153    Clinical Impression Statement Continued exercise  program  with addition of lunges, rockerboard, and stair training; patient does continue to demonstrate difficulty on stairs with difficulty bearing weight down efficiently through R leg for proper reciprocal stair pattern. Also continues to demonstrate significant tightness in bilateral quad muscle groups. Responded well to today's session with no increase in pain, but did require cues throughout for correct form for exercises.    Pt will benefit from skilled therapeutic intervention in order to improve on the following deficits Abnormal gait;Impaired perceived functional ability;Improper body mechanics;Decreased activity tolerance;Decreased strength;Impaired flexibility;Postural dysfunction;Decreased balance;Decreased mobility;Decreased range of motion;Increased edema;Pain;Decreased coordination;Decreased safety awareness   Rehab Potential Good   PT Frequency 3x / week   PT Duration 4 weeks   PT Treatment/Interventions ADLs/Self Care Home Management;Gait training;Neuromuscular re-education;Stair training;Functional mobility training;Patient/family education;Cryotherapy;Therapeutic activities;Therapeutic exercise;Manual techniques;Balance training   PT Next Visit Plan Continue functional stretches with focus on quad stretch; functional strengthening including lunges and step ups; rocker board, improve bilateral hip IR   PT Home Exercise Plan given   Consulted and Agree with Plan of Care Patient        Problem List Patient Active Problem List   Diagnosis Date Noted  . Muscle spasm 02/12/2014  . Arthritis of knee, right 01/30/2014  . Osteoarthritis   . Rotator cuff tear arthropathy 12/11/2013  . Sciatica 10/03/2013  . Primary osteoarthritis of right knee 10/03/2013  . GERD (gastroesophageal reflux disease) 10/23/2012  . Chest pain 10/22/2012  . OA (osteoarthritis) of knee 06/27/2012  . Colon cancer 02/01/2012  . Iron deficiency anemia 02/01/2012  . MEMORY LOSS 08/15/2008  . KNEE PAIN,  RIGHT 08/12/2008  . CONSTIPATION 03/01/2008  . Spinal stenosis of lumbar region 03/01/2008  . HYPERTENSION, BENIGN ESSENTIAL 01/19/2008  . SHOULDER PAIN, RIGHT 12/12/2007  . POSTMENOPAUSAL OSTEOPOROSIS 12/12/2006  . HYPERLIPIDEMIA 10/07/2006  . OVERACTIVE BLADDER 10/07/2006  . ARTHRITIS 10/07/2006    Deniece Ree PT, DPT [place] [address] [place], [place], [address] Phone: [phone]   Fax:  [phone]

## 2014-04-04 ENCOUNTER — Ambulatory Visit (HOSPITAL_COMMUNITY): Payer: Medicare Other

## 2014-04-04 DIAGNOSIS — M25561 Pain in right knee: Secondary | ICD-10-CM

## 2014-04-04 DIAGNOSIS — M6281 Muscle weakness (generalized): Secondary | ICD-10-CM | POA: Diagnosis not present

## 2014-04-04 DIAGNOSIS — R29898 Other symptoms and signs involving the musculoskeletal system: Secondary | ICD-10-CM

## 2014-04-04 DIAGNOSIS — Z471 Aftercare following joint replacement surgery: Secondary | ICD-10-CM | POA: Diagnosis not present

## 2014-04-04 DIAGNOSIS — Z96651 Presence of right artificial knee joint: Secondary | ICD-10-CM | POA: Diagnosis not present

## 2014-04-04 DIAGNOSIS — R269 Unspecified abnormalities of gait and mobility: Secondary | ICD-10-CM | POA: Diagnosis not present

## 2014-04-04 NOTE — Therapy (Signed)
[place], [place], [address] Phone: [phone]   Fax:  [phone]  Physical Therapy Treatment  Patient Details  Name: Sandra Williams MRN: [id number] Date of Birth: 1925-04-21 Referring Provider:  Ricard Dillon, MD  Encounter Date: 04/04/2014      PT End of Session - 04/04/14 1034    Visit Number 4   Number of Visits 12   Date for PT Re-Evaluation 04/10/14   Authorization Type Medicare   Authorization Time Period 03/13/14 to 05/13/14   Authorization - Visit Number 4   Authorization - Number of Visits 10   PT Start Time 1025   PT Stop Time 1103   PT Time Calculation (min) 38 min   Activity Tolerance Patient tolerated treatment well   Behavior During Therapy [facility] for tasks assessed/performed      Past Medical History  Diagnosis Date  . Osteoporosis   . Arthritis   . HTN (hypertension)   . Hyperlipidemia   . Cancer     cecal carcinoma  . GERD (gastroesophageal reflux disease)   . Colon cancer 02/01/2012    Stage I (T2, N0, M0) cancer the cecum status post resection by Dr. Arnoldo Morale with 14 lymph nodes found all of which were negative. She had no evidence for metastatic disease on CT scan of the abdomen and pelvis either. Her date of surgery was 10/22/2011.  Not in need of chemotherapy for her stage I cancer.   . Iron deficiency anemia 02/01/2012    At time of colon cancer presentation  . Knee joint pain 2014    right    Past Surgical History  Procedure Laterality Date  . Abdominal hysterectomy      partial  . Cataract extraction Bilateral   . Cesarean section    . Dental implant    . Appendectomy      [facility]  . Partial colectomy  10/22/2011    Procedure: PARTIAL COLECTOMY;  Surgeon: Jamesetta So, MD;  Location: [facility];  Service: General;  Laterality: N/A;  . Colon surgery    . Total knee arthroplasty Right 01/30/2014    Procedure: TOTAL KNEE ARTHROPLASTY;  Surgeon: Carole Civil, MD;   Location: [facility];  Service: Orthopedics;  Laterality: Right;    There were no vitals filed for this visit.  Visit Diagnosis:  Status post total right knee replacement  Abnormality of gait  Weakness of both legs  Pain in right knee      Subjective Assessment - 04/04/14 1027    Symptoms Pt stated soreness anterior Rt knee, pain free today.   Currently in Pain? No/denies   Pain Location Knee   Pain Orientation Right;Anterior   Pain Descriptors / Indicators Cramping                       [facility] Adult PT Treatment/Exercise - 04/04/14 0001    Exercises   Exercises Knee/Hip   Knee/Hip Exercises: Stretches   Passive Hamstring Stretch 3 reps;30 seconds   Passive Hamstring Stretch Limitations 14" box 3 directions   Quad Stretch 3 reps;30 seconds   Quad Stretch Limitations prone with rope   Gastroc Stretch 3 reps;30 seconds   Gastroc Stretch Limitations slant board   Knee/Hip Exercises: Standing   Other Standing Knee Exercises 3D hip excursions 1x10   Other Standing Knee Exercises Hip IR box walks 5 sets with 2 squats then walk around step each side  Knee/Hip Exercises: Supine   Quad Sets Right;10 reps   Short Arc Quad Sets Right;10 reps   Heel Slides Strengthening;Right;10 reps   Knee Extension PROM   Knee/Hip Exercises: Prone   Other Prone Exercises TKE 10x 5"AAROM                PT Education - 04/04/14 1039    Education provided Yes   Education Details Educated on beneifts of icing knee and ankle to for edmea control    Methods Explanation   Comprehension Verbalized understanding          PT Short Term Goals - 04/04/14 1034    PT SHORT TERM GOAL #1   Title Patient will display R knee range of motion of at least 7 degrees extension to 110 degrees flexion with pain 0/10   Status On-going   PT SHORT TERM GOAL #2   Title Patient will demonstrate an increase of at least one muscle grade in all tested muscle groups in order to improve knee  stability and improve overall function   Status On-going   PT SHORT TERM GOAL #3   Title Patient will demonstrate improved TKE during gait over stable and unstable surfaces without AD   PT SHORT TERM GOAL #4   Title Patient will be able to tolerate standing tasks for at least 90 minutes with pain no more than 2/10    Status On-going           PT Long Term Goals - 04/04/14 1035    PT LONG TERM GOAL #1   Title Patient will be independent in HEP and will perform program correctly and on consistent basis   PT LONG TERM GOAL #2   Title Patient will demonstrate R knee extension of no more than 3 degrees and knee flexion of at least 115 degrees   PT LONG TERM GOAL #3   Title Patient will demonstrate the abilty to reciprocally ascend/descend full flight of standard stairs with U railing and no circumduction, pain  R knee no more than 1/10   PT LONG TERM GOAL #4   Title Patient will demonstrate the ability to perform standing tasks for at least 3 hours with pain right knee no more than 1/10 in order to enhance ability to participate in functional tasks   PT LONG TERM GOAL #5   Title Patient will demonstrate the ability to ambulate unlimited distances over even and uneven surfaces, no AD, with pain right knee no more than 1/10               Plan - 04/04/14 1106    Clinical Impression Statement Pt late for apt, unable to complete full plan this session.  Session focus on improving flexibilty, quad strengthening and overall hip mobilty.  AROM 12-107.  Pt encouraged to increase quad sets at home.  Pt educated on benefits of applying ice for edema control.  No reports of pain through session.     PT Next Visit Plan Continue functional stretches with focus on quad stretch; functional strengthening including lunges and step ups; rocker board, improve bilateral hip IR.  Focus on knee extension next session.          Problem List Patient Active Problem List   Diagnosis Date Noted  . Muscle  spasm 02/12/2014  . Arthritis of knee, right 01/30/2014  . Osteoarthritis   . Rotator cuff tear arthropathy 12/11/2013  . Sciatica 10/03/2013  . Primary osteoarthritis of right knee 10/03/2013  .  GERD (gastroesophageal reflux disease) 10/23/2012  . Chest pain 10/22/2012  . OA (osteoarthritis) of knee 06/27/2012  . Colon cancer 02/01/2012  . Iron deficiency anemia 02/01/2012  . MEMORY LOSS 08/15/2008  . KNEE PAIN, RIGHT 08/12/2008  . CONSTIPATION 03/01/2008  . Spinal stenosis of lumbar region 03/01/2008  . HYPERTENSION, BENIGN ESSENTIAL 01/19/2008  . SHOULDER PAIN, RIGHT 12/12/2007  . POSTMENOPAUSAL OSTEOPOROSIS 12/12/2006  . HYPERLIPIDEMIA 10/07/2006  . OVERACTIVE BLADDER 10/07/2006  . ARTHRITIS 10/07/2006   Ihor Austin, [place]; [place], [place] #[id number] [phone]   Aldona Lento 04/04/2014, 11:17 AM  [place], [place], [address] Phone: [phone]   Fax:  [phone]

## 2014-04-09 ENCOUNTER — Ambulatory Visit (HOSPITAL_COMMUNITY): Payer: Medicare Other | Attending: Internal Medicine | Admitting: Physical Therapy

## 2014-04-09 DIAGNOSIS — R269 Unspecified abnormalities of gait and mobility: Secondary | ICD-10-CM | POA: Insufficient documentation

## 2014-04-09 DIAGNOSIS — Z471 Aftercare following joint replacement surgery: Secondary | ICD-10-CM | POA: Insufficient documentation

## 2014-04-09 DIAGNOSIS — Z96651 Presence of right artificial knee joint: Secondary | ICD-10-CM | POA: Insufficient documentation

## 2014-04-09 DIAGNOSIS — M6281 Muscle weakness (generalized): Secondary | ICD-10-CM | POA: Insufficient documentation

## 2014-04-09 DIAGNOSIS — M25561 Pain in right knee: Secondary | ICD-10-CM | POA: Insufficient documentation

## 2014-04-11 ENCOUNTER — Ambulatory Visit (HOSPITAL_COMMUNITY): Payer: Medicare Other

## 2014-04-15 ENCOUNTER — Ambulatory Visit: Payer: Medicare Other | Admitting: Orthopedic Surgery

## 2014-04-15 DIAGNOSIS — I509 Heart failure, unspecified: Secondary | ICD-10-CM | POA: Diagnosis not present

## 2014-04-15 DIAGNOSIS — R7309 Other abnormal glucose: Secondary | ICD-10-CM | POA: Diagnosis not present

## 2014-04-15 DIAGNOSIS — I10 Essential (primary) hypertension: Secondary | ICD-10-CM | POA: Diagnosis not present

## 2014-04-15 DIAGNOSIS — K219 Gastro-esophageal reflux disease without esophagitis: Secondary | ICD-10-CM | POA: Diagnosis not present

## 2014-04-15 DIAGNOSIS — E780 Pure hypercholesterolemia: Secondary | ICD-10-CM | POA: Diagnosis not present

## 2014-04-15 DIAGNOSIS — E785 Hyperlipidemia, unspecified: Secondary | ICD-10-CM | POA: Diagnosis not present

## 2014-04-15 DIAGNOSIS — D649 Anemia, unspecified: Secondary | ICD-10-CM | POA: Diagnosis not present

## 2014-04-15 DIAGNOSIS — C189 Malignant neoplasm of colon, unspecified: Secondary | ICD-10-CM | POA: Diagnosis not present

## 2014-04-16 ENCOUNTER — Ambulatory Visit (HOSPITAL_COMMUNITY): Payer: Medicare Other | Admitting: Physical Therapy

## 2014-04-18 ENCOUNTER — Ambulatory Visit (HOSPITAL_COMMUNITY): Payer: Medicare Other | Admitting: Physical Therapy

## 2014-04-18 DIAGNOSIS — R269 Unspecified abnormalities of gait and mobility: Secondary | ICD-10-CM

## 2014-04-18 DIAGNOSIS — M25561 Pain in right knee: Secondary | ICD-10-CM

## 2014-04-18 DIAGNOSIS — Z471 Aftercare following joint replacement surgery: Secondary | ICD-10-CM | POA: Diagnosis not present

## 2014-04-18 DIAGNOSIS — R29898 Other symptoms and signs involving the musculoskeletal system: Secondary | ICD-10-CM

## 2014-04-18 DIAGNOSIS — M6281 Muscle weakness (generalized): Secondary | ICD-10-CM | POA: Diagnosis not present

## 2014-04-18 DIAGNOSIS — Z96651 Presence of right artificial knee joint: Secondary | ICD-10-CM

## 2014-04-18 NOTE — Therapy (Signed)
[facility] [address] [place], [place], [address] Phone: [phone]   Fax:  [phone]  Physical Therapy Treatment (Re-Assessment)  Patient Details  Name: Sandra Williams MRN: [id number] Date of Birth: [birth year] Referring Provider:  Ricard Dillon, MD  Encounter Date: 04/18/2014      PT End of Session - 04/18/14 1148    Visit Number 5   Number of Visits 12   Date for PT Re-Evaluation 05/16/14   Authorization Type Medicare   Authorization Time Period 03/13/14 to 05/13/14; G-code done 5th visit    Authorization - Visit Number 5   Authorization - Number of Visits 10   PT Start Time 1100   PT Stop Time 1144   PT Time Calculation (min) 44 min   Activity Tolerance Patient tolerated treatment well   Behavior During Therapy [facility] for tasks assessed/performed      Past Medical History  Diagnosis Date  . Osteoporosis   . Arthritis   . HTN (hypertension)   . Hyperlipidemia   . Cancer     cecal carcinoma  . GERD (gastroesophageal reflux disease)   . Colon cancer 02/01/2012    Stage I (T2, N0, M0) cancer the cecum status post resection by Dr. Arnoldo Morale with 14 lymph nodes found all of which were negative. She had no evidence for metastatic disease on CT scan of the abdomen and pelvis either. Her date of surgery was 10/22/2011.  Not in need of chemotherapy for her stage I cancer.   . Iron deficiency anemia 02/01/2012    At time of colon cancer presentation  . Knee joint pain 2014    right    Past Surgical History  Procedure Laterality Date  . Abdominal hysterectomy      partial  . Cataract extraction Bilateral   . Cesarean section    . Dental implant    . Appendectomy      [facility]  . Partial colectomy  10/22/2011    Procedure: PARTIAL COLECTOMY;  Surgeon: Jamesetta So, MD;  Location: [facility];  Service: General;  Laterality: N/A;  . Colon surgery    . Total knee arthroplasty Right 01/30/2014    Procedure: TOTAL KNEE  ARTHROPLASTY;  Surgeon: Carole Civil, MD;  Location: [facility];  Service: Orthopedics;  Laterality: Right;    There were no vitals filed for this visit.  Visit Diagnosis:  Status post total right knee replacement  Abnormality of gait  Weakness of both legs  Pain in right knee      Subjective Assessment - 04/18/14 1103    Subjective Patient was having a little bit of pain this morning but took a pain pill, relatively pain free now    Pertinent History Patient had a lot of pain bending knee; eventually got surgery   How long can you sit comfortably? 4/14- unlimited   How long can you stand comfortably? 4/14- no real limitations right now    How long can you walk comfortably? 4/14- still no pain with walking    Currently in Pain? No/denies            [facility] PT Assessment - 04/18/14 0001    Assessment   Medical Diagnosis s/p R TKR   Onset Date 02/13/14  approximate date   Next MD Visit Pt cannot recall   AROM   Right Hip External Rotation  38   Right Hip Internal Rotation  21   Left Hip External Rotation  38  Left Hip Internal Rotation  25   Right Knee Extension 11   Right Knee Flexion 107   Right Ankle Dorsiflexion 15   Left Ankle Dorsiflexion 17   Strength   Right Hip Flexion 3+/5   Right Hip Extension 2/5   Right Hip ABduction 3+/5   Left Hip Flexion 4-/5   Left Hip Extension 2+/5   Left Hip ABduction 4-/5   Right Knee Flexion 4+/5   Right Knee Extension 4+/5   Left Knee Flexion 4+/5   Left Knee Extension 4+/5   Right Ankle Dorsiflexion 5/5   Left Ankle Dorsiflexion 5/5                   [facility] Adult PT Treatment/Exercise - 04/18/14 0001    Knee/Hip Exercises: Stretches   Passive Hamstring Stretch 3 reps;30 seconds   Passive Hamstring Stretch Limitations 14" box 3 directions   Quad Stretch 3 reps;30 seconds   Quad Stretch Limitations prone with rope   Gastroc Stretch 3 reps;30 seconds   Gastroc Stretch Limitations slant board   Knee/Hip  Exercises: Standing   Forward Lunges Both;1 set;10 reps   Forward Lunges Limitations 4 inch box, reach to knee    Other Standing Knee Exercises 3D hip excursions 1x10 split stance                 PT Education - 04/18/14 1147    Education provided Yes   Education Details Progress with skilled PT services; PT plan of care moving forward; walked patient up front to schedule more appointments    Person(s) Educated Patient   Methods Explanation   Comprehension Verbalized understanding          PT Short Term Goals - 04/18/14 1152    PT SHORT TERM GOAL #1   Title Patient will display R knee range of motion of at least 7 degrees extension to 110 degrees flexion with pain 0/10   Time 2   Period Weeks   Status On-going   PT SHORT TERM GOAL #2   Title Patient will demonstrate an increase of at least one muscle grade in all tested muscle groups in order to improve knee stability and improve overall function   Time 2   Period Weeks   Status On-going   PT SHORT TERM GOAL #3   Title Patient will demonstrate improved TKE during gait over stable and unstable surfaces without AD   Time 2   Period Weeks   Status On-going   PT SHORT TERM GOAL #4   Title Patient will be able to tolerate standing tasks for at least 90 minutes with pain no more than 2/10    Time 2   Period Weeks   Status On-going           PT Long Term Goals - 04/18/14 1152    PT LONG TERM GOAL #1   Title Patient will be independent in HEP and will perform program correctly and on consistent basis   Time 4   Period Weeks   Status On-going   PT LONG TERM GOAL #2   Title Patient will demonstrate R knee extension of no more than 3 degrees and knee flexion of at least 115 degrees   Time 4   Period Weeks   Status On-going   PT LONG TERM GOAL #3   Title Patient will demonstrate the abilty to reciprocally ascend/descend full flight of standard stairs with U railing and no circumduction, pain  R  knee no more than  1/10   Baseline 04/19/22- no circumduction but visible effort and difficulty with stairs at this time, step to pattern on descent    Time 4   Period Weeks   Status Partially Met   PT LONG TERM GOAL #4   Title Patient will demonstrate the ability to perform standing tasks for at least 3 hours with pain right knee no more than 1/10 in order to enhance ability to participate in functional tasks   Time 4   Period Weeks   Status On-going   PT LONG TERM GOAL #5   Title Patient will demonstrate the ability to ambulate unlimited distances over even and uneven surfaces, no AD, with pain right knee no more than 1/10   Baseline 19-Apr-2022- patient unable to describe if she has tried walking over uneven surfaces at this time or not    Time 4   Period Weeks   Status On-going               Plan - 04/19/2014 1149    Clinical Impression Statement Re-assessment performed today. Patient continues to demonstrate reduced range of motion in R knee, experiences R knee pain, displays proximal muscle weakness, demonstrates gait impairments, postural impairments, and overall displays reduced ability to perform functional tasks at this time.  Patient continues to require a high amount of cues to correctly perform tasks and exercises with PT at this time. Patient will benefit from 4 more weeks of skilled PT services in order to address her remaining deficits as well as to assist her in reaching an optimal level of function.    Pt will benefit from skilled therapeutic intervention in order to improve on the following deficits Abnormal gait;Impaired perceived functional ability;Improper body mechanics;Decreased activity tolerance;Decreased strength;Impaired flexibility;Postural dysfunction;Decreased balance;Decreased mobility;Decreased range of motion;Increased edema;Pain;Decreased coordination;Decreased safety awareness   Rehab Potential Good   PT Frequency 2x / week   PT Duration 4 weeks   PT Treatment/Interventions  ADLs/Self Care Home Management;Gait training;Neuromuscular re-education;Stair training;Functional mobility training;Patient/family education;Cryotherapy;Therapeutic activities;Therapeutic exercise;Manual techniques;Balance training   PT Next Visit Plan Continue functional stretches with focus on quad stretch; functional strengthening including lunges and step ups; rocker board, improve bilateral hip IR.  Focus on knee and hip  ROM next session.   PT Home Exercise Plan given   Consulted and Agree with Plan of Care Patient          G-Codes - 04/19/2014 1154    Functional Assessment Tool Used Skilled clinical assessment and judgement based on R knee ROM, strength, and general gait/functional mobility    Functional Limitation Mobility: Walking and moving around   Mobility: Walking and Moving Around Current Status (L0786) At least 20 percent but less than 40 percent impaired, limited or restricted   Mobility: Walking and Moving Around Goal Status [phone]) At least 1 percent but less than 20 percent impaired, limited or restricted      Problem List Patient Active Problem List   Diagnosis Date Noted  . Muscle spasm 02/12/2014  . Arthritis of knee, right 01/30/2014  . Osteoarthritis   . Rotator cuff tear arthropathy 12/11/2013  . Sciatica 10/03/2013  . Primary osteoarthritis of right knee 10/03/2013  . GERD (gastroesophageal reflux disease) 10/23/2012  . Chest pain 10/22/2012  . OA (osteoarthritis) of knee 06/27/2012  . Colon cancer 02/01/2012  . Iron deficiency anemia 02/01/2012  . MEMORY LOSS 08/15/2008  . KNEE PAIN, RIGHT 08/12/2008  . CONSTIPATION 03/01/2008  . Spinal stenosis of  lumbar region 03/01/2008  . HYPERTENSION, BENIGN ESSENTIAL 01/19/2008  . SHOULDER PAIN, RIGHT 12/12/2007  . POSTMENOPAUSAL OSTEOPOROSIS 12/12/2006  . HYPERLIPIDEMIA 10/07/2006  . OVERACTIVE BLADDER 10/07/2006  . ARTHRITIS 10/07/2006    Deniece Ree PT, DPT [facility] [address] [place], [place], [address] Phone: [phone]   Fax:  [phone]

## 2014-04-23 ENCOUNTER — Ambulatory Visit (HOSPITAL_COMMUNITY): Payer: Medicare Other | Admitting: Physical Therapy

## 2014-04-23 DIAGNOSIS — Z96651 Presence of right artificial knee joint: Secondary | ICD-10-CM | POA: Diagnosis not present

## 2014-04-23 DIAGNOSIS — R269 Unspecified abnormalities of gait and mobility: Secondary | ICD-10-CM

## 2014-04-23 DIAGNOSIS — M6281 Muscle weakness (generalized): Secondary | ICD-10-CM | POA: Diagnosis not present

## 2014-04-23 DIAGNOSIS — R29898 Other symptoms and signs involving the musculoskeletal system: Secondary | ICD-10-CM

## 2014-04-23 DIAGNOSIS — M25561 Pain in right knee: Secondary | ICD-10-CM

## 2014-04-23 DIAGNOSIS — Z471 Aftercare following joint replacement surgery: Secondary | ICD-10-CM | POA: Diagnosis not present

## 2014-04-23 NOTE — Therapy (Signed)
[facility] [place], [place], [address] Phone: [phone]   Fax:  [phone]  Physical Therapy Treatment  Patient Details  Name: Sandra Williams MRN: [id number] Date of Birth: 10/19/1925 Referring Provider:  Ricard Dillon, MD  Encounter Date: 04/23/2014      PT End of Session - 04/23/14 1015    Visit Number 6   Number of Visits 12   Date for PT Re-Evaluation 05/16/14   Authorization Type Medicare   Authorization Time Period 03/13/14 to 05/13/14; G-code done 5th visit    Authorization - Visit Number 6   Authorization - Number of Visits 10   PT Start Time 0933   PT Stop Time 1013   PT Time Calculation (min) 40 min   Activity Tolerance Patient tolerated treatment well   Behavior During Therapy [facility] for tasks assessed/performed      Past Medical History  Diagnosis Date  . Osteoporosis   . Arthritis   . HTN (hypertension)   . Hyperlipidemia   . Cancer     cecal carcinoma  . GERD (gastroesophageal reflux disease)   . Colon cancer 02/01/2012    Stage I (T2, N0, M0) cancer the cecum status post resection by Dr. Arnoldo Morale with 14 lymph nodes found all of which were negative. She had no evidence for metastatic disease on CT scan of the abdomen and pelvis either. Her date of surgery was 10/22/2011.  Not in need of chemotherapy for her stage I cancer.   . Iron deficiency anemia 02/01/2012    At time of colon cancer presentation  . Knee joint pain 2014    right    Past Surgical History  Procedure Laterality Date  . Abdominal hysterectomy      partial  . Cataract extraction Bilateral   . Cesarean section    . Dental implant    . Appendectomy      [facility]  . Partial colectomy  10/22/2011    Procedure: PARTIAL COLECTOMY;  Surgeon: Jamesetta So, MD;  Location: [facility];  Service: General;  Laterality: N/A;  . Colon surgery    . Total knee arthroplasty Right 01/30/2014    Procedure: TOTAL KNEE ARTHROPLASTY;  Surgeon:  Carole Civil, MD;  Location: [facility];  Service: Orthopedics;  Laterality: Right;    There were no vitals filed for this visit.  Visit Diagnosis:  Status post total right knee replacement  Abnormality of gait  Weakness of both legs  Pain in right knee      Subjective Assessment - 04/23/14 0935    Subjective Patient not having any pain today, just feeling a little bit weak    Pertinent History Patient had a lot of pain bending knee; eventually got surgery   Currently in Pain? No/denies                         [facility] Adult PT Treatment/Exercise - 04/23/14 0001    Knee/Hip Exercises: Stretches   Passive Hamstring Stretch 3 reps;30 seconds   Passive Hamstring Stretch Limitations 12 " box 3 directions   Quad Stretch 3 reps;30 seconds   Quad Stretch Limitations prone with rope   Piriformis Stretch 2 reps;30 seconds   Piriformis Stretch Limitations seated   Gastroc Stretch 3 reps;30 seconds   Gastroc Stretch Limitations slant board   Knee/Hip Exercises: Standing   Forward Lunges Both;1 set;10 reps   Forward Lunges Limitations 4 inch box  Side Lunges Both;1 set;10 reps   Side Lunges Limitations 4 inch box    Terminal Knee Extension Right;1 set;15 reps   Theraband Level (Terminal Knee Extension) Level 2 (Red)   Terminal Knee Extension Limitations 3 second holds    Step Down Both;1 set;10 reps   Step Down Limitations 4 inch step    Rocker Board Other (comment)   Rocker Board Limitations x20 A-P, x20 lateral    Other Standing Knee Exercises Forward knee drives on stairs 1Z00; side stepping approx 2x15f    Other Standing Knee Exercises 3D hip excursions 1x10 neutral stance                 PT Education - 04/23/14 1014    Education provided Yes   Education Details Education on muscle fatigue after exercise    Person(s) Educated Patient   Methods Explanation   Comprehension Verbalized understanding          PT Short Term Goals - 04/18/14 1152     PT SHORT TERM GOAL #1   Title Patient will display R knee range of motion of at least 7 degrees extension to 110 degrees flexion with pain 0/10   Time 2   Period Weeks   Status On-going   PT SHORT TERM GOAL #2   Title Patient will demonstrate an increase of at least one muscle grade in all tested muscle groups in order to improve knee stability and improve overall function   Time 2   Period Weeks   Status On-going   PT SHORT TERM GOAL #3   Title Patient will demonstrate improved TKE during gait over stable and unstable surfaces without AD   Time 2   Period Weeks   Status On-going   PT SHORT TERM GOAL #4   Title Patient will be able to tolerate standing tasks for at least 90 minutes with pain no more than 2/10    Time 2   Period Weeks   Status On-going           PT Long Term Goals - 04/18/14 1152    PT LONG TERM GOAL #1   Title Patient will be independent in HEP and will perform program correctly and on consistent basis   Time 4   Period Weeks   Status On-going   PT LONG TERM GOAL #2   Title Patient will demonstrate R knee extension of no more than 3 degrees and knee flexion of at least 115 degrees   Time 4   Period Weeks   Status On-going   PT LONG TERM GOAL #3   Title Patient will demonstrate the abilty to reciprocally ascend/descend full flight of standard stairs with U railing and no circumduction, pain  R knee no more than 1/10   Baseline 4/14- no circumduction but visible effort and difficulty with stairs at this time, step to pattern on descent    Time 4   Period Weeks   Status Partially Met   PT LONG TERM GOAL #4   Title Patient will demonstrate the ability to perform standing tasks for at least 3 hours with pain right knee no more than 1/10 in order to enhance ability to participate in functional tasks   Time 4   Period Weeks   Status On-going   PT LONG TERM GOAL #5   Title Patient will demonstrate the ability to ambulate unlimited distances over even and  uneven surfaces, no AD, with pain right knee no more than 1/10  Baseline 4/14- patient unable to describe if she has tried walking over uneven surfaces at this time or not    Time 4   Period Weeks   Status On-going               Plan - 04/23/14 1015    Clinical Impression Statement Continued functional exercise and range of motion program today; patient does continue to require cues for proper performances of exercises but overall tolerated session well. Patient did state some concerns regarding fatigue in her knee and was educated on muscle fatigue and need for functional strengthening activities.    Pt will benefit from skilled therapeutic intervention in order to improve on the following deficits Abnormal gait;Impaired perceived functional ability;Improper body mechanics;Decreased activity tolerance;Decreased strength;Impaired flexibility;Postural dysfunction;Decreased balance;Decreased mobility;Decreased range of motion;Increased edema;Pain;Decreased coordination;Decreased safety awareness   Rehab Potential Good   PT Frequency 2x / week   PT Duration 4 weeks   PT Treatment/Interventions ADLs/Self Care Home Management;Gait training;Neuromuscular re-education;Stair training;Functional mobility training;Patient/family education;Cryotherapy;Therapeutic activities;Therapeutic exercise;Manual techniques;Balance training   PT Next Visit Plan Continue functional stretches with focus on quad stretch; functional strengthening including lunges and step ups; rocker board, improve bilateral hip IR.  Focus on knee and hip  ROM next session.   PT Home Exercise Plan given   Consulted and Agree with Plan of Care Patient        Problem List Patient Active Problem List   Diagnosis Date Noted  . Muscle spasm 02/12/2014  . Arthritis of knee, right 01/30/2014  . Osteoarthritis   . Rotator cuff tear arthropathy 12/11/2013  . Sciatica 10/03/2013  . Primary osteoarthritis of right knee 10/03/2013   . GERD (gastroesophageal reflux disease) 10/23/2012  . Chest pain 10/22/2012  . OA (osteoarthritis) of knee 06/27/2012  . Colon cancer 02/01/2012  . Iron deficiency anemia 02/01/2012  . MEMORY LOSS 08/15/2008  . KNEE PAIN, RIGHT 08/12/2008  . CONSTIPATION 03/01/2008  . Spinal stenosis of lumbar region 03/01/2008  . HYPERTENSION, BENIGN ESSENTIAL 01/19/2008  . SHOULDER PAIN, RIGHT 12/12/2007  . POSTMENOPAUSAL OSTEOPOROSIS 12/12/2006  . HYPERLIPIDEMIA 10/07/2006  . OVERACTIVE BLADDER 10/07/2006  . ARTHRITIS 10/07/2006    Deniece Ree PT, DPT [facility] [address] [place], [place], [address] Phone: [phone]   Fax:  [phone]

## 2014-04-25 ENCOUNTER — Ambulatory Visit (HOSPITAL_COMMUNITY): Payer: Medicare Other | Admitting: Physical Therapy

## 2014-04-25 DIAGNOSIS — M25561 Pain in right knee: Secondary | ICD-10-CM

## 2014-04-25 DIAGNOSIS — R269 Unspecified abnormalities of gait and mobility: Secondary | ICD-10-CM

## 2014-04-25 DIAGNOSIS — M6281 Muscle weakness (generalized): Secondary | ICD-10-CM | POA: Diagnosis not present

## 2014-04-25 DIAGNOSIS — R29898 Other symptoms and signs involving the musculoskeletal system: Secondary | ICD-10-CM

## 2014-04-25 DIAGNOSIS — Z96651 Presence of right artificial knee joint: Secondary | ICD-10-CM

## 2014-04-25 DIAGNOSIS — Z471 Aftercare following joint replacement surgery: Secondary | ICD-10-CM | POA: Diagnosis not present

## 2014-04-25 NOTE — Therapy (Signed)
[place], [place], [address] Phone: [phone]   Fax:  [phone]  Physical Therapy Treatment  Patient Details  Name: Sandra Williams MRN: [id number] Date of Birth: 1925-02-01 Referring Provider:  Ricard Dillon, MD  Encounter Date: 04/25/2014      PT End of Session - 04/25/14 1151    Visit Number 7   Number of Visits 12   Date for PT Re-Evaluation 05/16/14   Authorization Type Medicare   Authorization Time Period 03/13/14 to 05/13/14; G-code done 5th visit    Authorization - Visit Number 7   Authorization - Number of Visits 10   PT Start Time 1105   PT Stop Time 1145   PT Time Calculation (min) 40 min   Activity Tolerance Patient tolerated treatment well   Behavior During Therapy [facility] for tasks assessed/performed      Past Medical History  Diagnosis Date  . Osteoporosis   . Arthritis   . HTN (hypertension)   . Hyperlipidemia   . Cancer     cecal carcinoma  . GERD (gastroesophageal reflux disease)   . Colon cancer 02/01/2012    Stage I (T2, N0, M0) cancer the cecum status post resection by Dr. Arnoldo Morale with 14 lymph nodes found all of which were negative. She had no evidence for metastatic disease on CT scan of the abdomen and pelvis either. Her date of surgery was 10/22/2011.  Not in need of chemotherapy for her stage I cancer.   . Iron deficiency anemia 02/01/2012    At time of colon cancer presentation  . Knee joint pain 2014    right    Past Surgical History  Procedure Laterality Date  . Abdominal hysterectomy      partial  . Cataract extraction Bilateral   . Cesarean section    . Dental implant    . Appendectomy      [facility]  . Partial colectomy  10/22/2011    Procedure: PARTIAL COLECTOMY;  Surgeon: Jamesetta So, MD;  Location: [facility];  Service: General;  Laterality: N/A;  . Colon surgery    . Total knee arthroplasty Right 01/30/2014    Procedure: TOTAL KNEE ARTHROPLASTY;  Surgeon:  Carole Civil, MD;  Location: [facility];  Service: Orthopedics;  Laterality: Right;    There were no vitals filed for this visit.  Visit Diagnosis:  Status post total right knee replacement  Abnormality of gait  Weakness of both legs  Pain in right knee      Subjective Assessment - 04/25/14 1123    Subjective Patient states she is doing OK, no big plans for the weekend   Pertinent History Patient had a lot of pain bending knee; eventually got surgery   Currently in Pain? Yes   Pain Score 6    Pain Location Knee   Pain Orientation Right                         [facility] Adult PT Treatment/Exercise - 04/25/14 0001    Knee/Hip Exercises: Stretches   Passive Hamstring Stretch 3 reps;30 seconds   Passive Hamstring Stretch Limitations 14 inch box, 3 directions    Quad Stretch 2 reps;30 seconds   Quad Stretch Limitations prone with rope    Piriformis Stretch 2 reps;30 seconds   Piriformis Stretch Limitations seated   Gastroc Stretch 3 reps;30 seconds   Gastroc Stretch Limitations slant board   Knee/Hip Exercises: Standing  Forward Lunges Both;1 set;10 reps   Forward Lunges Limitations 4 inch box    Side Lunges Both;1 set;10 reps   Side Lunges Limitations 4 inch box    Rocker Board Other (comment)   Rocker Board Limitations x20 A-P, x20 lateral    Other Standing Knee Exercises Forward knee drives on stairs 8A16; side stepping approx 2x3f ; sit to stand with eccentric lower 1x10   Other Standing Knee Exercises 3D hip excursions 1x15 neutral stance                 PT Education - 04/25/14 1151    Education provided No          PT Short Term Goals - 04/18/14 1152    PT SHORT TERM GOAL #1   Title Patient will display R knee range of motion of at least 7 degrees extension to 110 degrees flexion with pain 0/10   Time 2   Period Weeks   Status On-going   PT SHORT TERM GOAL #2   Title Patient will demonstrate an increase of at least one muscle  grade in all tested muscle groups in order to improve knee stability and improve overall function   Time 2   Period Weeks   Status On-going   PT SHORT TERM GOAL #3   Title Patient will demonstrate improved TKE during gait over stable and unstable surfaces without AD   Time 2   Period Weeks   Status On-going   PT SHORT TERM GOAL #4   Title Patient will be able to tolerate standing tasks for at least 90 minutes with pain no more than 2/10    Time 2   Period Weeks   Status On-going           PT Long Term Goals - 04/18/14 1152    PT LONG TERM GOAL #1   Title Patient will be independent in HEP and will perform program correctly and on consistent basis   Time 4   Period Weeks   Status On-going   PT LONG TERM GOAL #2   Title Patient will demonstrate R knee extension of no more than 3 degrees and knee flexion of at least 115 degrees   Time 4   Period Weeks   Status On-going   PT LONG TERM GOAL #3   Title Patient will demonstrate the abilty to reciprocally ascend/descend full flight of standard stairs with U railing and no circumduction, pain  R knee no more than 1/10   Baseline 4/14- no circumduction but visible effort and difficulty with stairs at this time, step to pattern on descent    Time 4   Period Weeks   Status Partially Met   PT LONG TERM GOAL #4   Title Patient will demonstrate the ability to perform standing tasks for at least 3 hours with pain right knee no more than 1/10 in order to enhance ability to participate in functional tasks   Time 4   Period Weeks   Status On-going   PT LONG TERM GOAL #5   Title Patient will demonstrate the ability to ambulate unlimited distances over even and uneven surfaces, no AD, with pain right knee no more than 1/10   Baseline 4/14- patient unable to describe if she has tried walking over uneven surfaces at this time or not    Time 4   Period Weeks   Status On-going  Plan - 04/25/14 1152    Clinical  Impression Statement Patient arrived with approximately 6-7/10 pain in her knee, states she's not sure what's causing it today as she is usually pain free. Continued functional exercise program with reduced focus on step downs and lunges, pain reduced to 0/10 at end of session. Patient continues to require cues for correct exercise form at this time.    Pt will benefit from skilled therapeutic intervention in order to improve on the following deficits Abnormal gait;Impaired perceived functional ability;Improper body mechanics;Decreased activity tolerance;Decreased strength;Impaired flexibility;Postural dysfunction;Decreased balance;Decreased mobility;Decreased range of motion;Increased edema;Pain;Decreased coordination;Decreased safety awareness   Rehab Potential Good   PT Frequency 2x / week   PT Duration 4 weeks   PT Treatment/Interventions ADLs/Self Care Home Management;Gait training;Neuromuscular re-education;Stair training;Functional mobility training;Patient/family education;Cryotherapy;Therapeutic activities;Therapeutic exercise;Manual techniques;Balance training   PT Next Visit Plan Continue functional stretches with focus on quad stretch; functional strengthening including lunges and step ups; rocker board, improve bilateral hip IR.  Focus on knee and hip  ROM next session.   PT Home Exercise Plan given   Consulted and Agree with Plan of Care Patient        Problem List Patient Active Problem List   Diagnosis Date Noted  . Muscle spasm 02/12/2014  . Arthritis of knee, right 01/30/2014  . Osteoarthritis   . Rotator cuff tear arthropathy 12/11/2013  . Sciatica 10/03/2013  . Primary osteoarthritis of right knee 10/03/2013  . GERD (gastroesophageal reflux disease) 10/23/2012  . Chest pain 10/22/2012  . OA (osteoarthritis) of knee 06/27/2012  . Colon cancer 02/01/2012  . Iron deficiency anemia 02/01/2012  . MEMORY LOSS 08/15/2008  . KNEE PAIN, RIGHT 08/12/2008  . CONSTIPATION  03/01/2008  . Spinal stenosis of lumbar region 03/01/2008  . HYPERTENSION, BENIGN ESSENTIAL 01/19/2008  . SHOULDER PAIN, RIGHT 12/12/2007  . POSTMENOPAUSAL OSTEOPOROSIS 12/12/2006  . HYPERLIPIDEMIA 10/07/2006  . OVERACTIVE BLADDER 10/07/2006  . ARTHRITIS 10/07/2006    Deniece Ree PT, DPT [place] [address] [place], [place], [address] Phone: [phone]   Fax:  [phone]

## 2014-05-02 ENCOUNTER — Ambulatory Visit (HOSPITAL_COMMUNITY): Payer: Medicare Other | Admitting: Physical Therapy

## 2014-05-02 DIAGNOSIS — M25561 Pain in right knee: Secondary | ICD-10-CM

## 2014-05-02 DIAGNOSIS — Z471 Aftercare following joint replacement surgery: Secondary | ICD-10-CM | POA: Diagnosis not present

## 2014-05-02 DIAGNOSIS — Z96651 Presence of right artificial knee joint: Secondary | ICD-10-CM

## 2014-05-02 DIAGNOSIS — R269 Unspecified abnormalities of gait and mobility: Secondary | ICD-10-CM

## 2014-05-02 DIAGNOSIS — R29898 Other symptoms and signs involving the musculoskeletal system: Secondary | ICD-10-CM

## 2014-05-02 DIAGNOSIS — M6281 Muscle weakness (generalized): Secondary | ICD-10-CM | POA: Diagnosis not present

## 2014-05-02 NOTE — Therapy (Signed)
[place], [place], [address] Phone: [phone]   Fax:  [phone]  Physical Therapy Treatment  Patient Details  Name: Sandra Williams MRN: [id number] Date of Birth: [birth year] Referring Provider:  Ricard Dillon, MD  Encounter Date: 05/02/2014      PT End of Session - 05/02/14 1148    Visit Number 8   Number of Visits 12   Date for PT Re-Evaluation 05/16/14   Authorization Type Medicare   Authorization Time Period 03/13/14 to 05/13/14; G-code done 5th visit    Authorization - Visit Number 8   Authorization - Number of Visits 10   PT Start Time 3295   PT Stop Time 1144   PT Time Calculation (min) 41 min   Activity Tolerance Patient tolerated treatment well   Behavior During Therapy [facility] for tasks assessed/performed      Past Medical History  Diagnosis Date  . Osteoporosis   . Arthritis   . HTN (hypertension)   . Hyperlipidemia   . Cancer     cecal carcinoma  . GERD (gastroesophageal reflux disease)   . Colon cancer 02/01/2012    Stage I (T2, N0, M0) cancer the cecum status post resection by Dr. Arnoldo Morale with 14 lymph nodes found all of which were negative. She had no evidence for metastatic disease on CT scan of the abdomen and pelvis either. Her date of surgery was 10/22/2011.  Not in need of chemotherapy for her stage I cancer.   . Iron deficiency anemia 02/01/2012    At time of colon cancer presentation  . Knee joint pain 2014    right    Past Surgical History  Procedure Laterality Date  . Abdominal hysterectomy      partial  . Cataract extraction Bilateral   . Cesarean section    . Dental implant    . Appendectomy      [facility]  . Partial colectomy  10/22/2011    Procedure: PARTIAL COLECTOMY;  Surgeon: Jamesetta So, MD;  Location: [facility];  Service: General;  Laterality: N/A;  . Colon surgery    . Total knee arthroplasty Right 01/30/2014    Procedure: TOTAL KNEE ARTHROPLASTY;  Surgeon:  Carole Civil, MD;  Location: [facility];  Service: Orthopedics;  Laterality: Right;    There were no vitals filed for this visit.  Visit Diagnosis:  Status post total right knee replacement  Abnormality of gait  Weakness of both legs  Pain in right knee      Subjective Assessment - 05/02/14 1105    Subjective Patient not having any pain this morning, just planning on sitting around this weekend    Pertinent History Patient had a lot of pain bending knee; eventually got surgery   Currently in Pain? No/denies                         [facility] Adult PT Treatment/Exercise - 05/02/14 0001    Ambulation/Gait   Ambulation/Gait Yes   Ambulation/Gait Assistance 7: Independent   Ambulation Distance (Feet) 400 Feet   Assistive device None   Gait Pattern Step-through pattern;Decreased step length - right;Decreased step length - left;Decreased dorsiflexion - right;Decreased dorsiflexion - left;Decreased weight shift to right;Right flexed knee in stance;Decreased trunk rotation;Trunk flexed;Narrow base of support;Poor foot clearance - left;Poor foot clearance - right   Ambulation Surface Level;Indoor   Stairs Yes   Stairs Assistance 7: Independent   Stair Management  Technique One rail Right   Number of Stairs 7   Height of Stairs 4   Gait Comments Initially presented with gait deviations indicated above, however was able to correct with Min-Mod cues. Continues to demonstrate knee instability and weakness on stairs with Min cues for proper sequencing.    Knee/Hip Exercises: Stretches   Passive Hamstring Stretch 3 reps;30 seconds   Passive Hamstring Stretch Limitations 12 inch box, 3 directions    Quad Stretch 2 reps;30 seconds   Quad Stretch Limitations prone with rope    Piriformis Stretch 2 reps;30 seconds   Piriformis Stretch Limitations seated   Gastroc Stretch 3 reps;30 seconds   Gastroc Stretch Limitations slant board   Knee/Hip Exercises: Standing   Forward Lunges  Both;1 set;10 reps   Forward Lunges Limitations 2 inch box    Side Lunges Both;1 set;10 reps   Side Lunges Limitations 2 inch box    Terminal Knee Extension Right;1 set;15 reps   Theraband Level (Terminal Knee Extension) Level 3 (Green)   Terminal Knee Extension Limitations 3 second holds    Diplomatic Services operational officer Other (comment)   Rocker Board Limitations x20 A-P, x20 lateral, U HHA    Other Standing Knee Exercises Forward knee drives on stairs 9V69                PT Education - 05/02/14 1148    Education provided No          PT Short Term Goals - 04/18/14 1152    PT SHORT TERM GOAL #1   Title Patient will display R knee range of motion of at least 7 degrees extension to 110 degrees flexion with pain 0/10   Time 2   Period Weeks   Status On-going   PT SHORT TERM GOAL #2   Title Patient will demonstrate an increase of at least one muscle grade in all tested muscle groups in order to improve knee stability and improve overall function   Time 2   Period Weeks   Status On-going   PT SHORT TERM GOAL #3   Title Patient will demonstrate improved TKE during gait over stable and unstable surfaces without AD   Time 2   Period Weeks   Status On-going   PT SHORT TERM GOAL #4   Title Patient will be able to tolerate standing tasks for at least 90 minutes with pain no more than 2/10    Time 2   Period Weeks   Status On-going           PT Long Term Goals - 04/18/14 1152    PT LONG TERM GOAL #1   Title Patient will be independent in HEP and will perform program correctly and on consistent basis   Time 4   Period Weeks   Status On-going   PT LONG TERM GOAL #2   Title Patient will demonstrate R knee extension of no more than 3 degrees and knee flexion of at least 115 degrees   Time 4   Period Weeks   Status On-going   PT LONG TERM GOAL #3   Title Patient will demonstrate the abilty to reciprocally ascend/descend full flight of standard stairs with U railing and no  circumduction, pain  R knee no more than 1/10   Baseline 4/14- no circumduction but visible effort and difficulty with stairs at this time, step to pattern on descent    Time 4   Period Weeks   Status Partially Met   PT  LONG TERM GOAL #4   Title Patient will demonstrate the ability to perform standing tasks for at least 3 hours with pain right knee no more than 1/10 in order to enhance ability to participate in functional tasks   Time 4   Period Weeks   Status On-going   PT LONG TERM GOAL #5   Title Patient will demonstrate the ability to ambulate unlimited distances over even and uneven surfaces, no AD, with pain right knee no more than 1/10   Baseline 4/14- patient unable to describe if she has tried walking over uneven surfaces at this time or not    Time 4   Period Weeks   Status On-going               Plan - 05/02/14 1148    Clinical Impression Statement Patient arrived with 0/10 pain in her knee, just stiffness. Continued functional exercises with progression of lunges, continued to address knee ROM. Increased focus on gait and stair training today; initially displayed significant gait deviations but was able to correct form with Min-Mod cues for heel-toe walking and posture. Also continues to display knee weakness on stair descent and needed Min cues for technique today.    Pt will benefit from skilled therapeutic intervention in order to improve on the following deficits Abnormal gait;Impaired perceived functional ability;Improper body mechanics;Decreased activity tolerance;Decreased strength;Impaired flexibility;Postural dysfunction;Decreased balance;Decreased mobility;Decreased range of motion;Increased edema;Pain;Decreased coordination;Decreased safety awareness   Rehab Potential Good   PT Frequency 2x / week   PT Duration 4 weeks   PT Treatment/Interventions ADLs/Self Care Home Management;Gait training;Neuromuscular re-education;Stair training;Functional mobility  training;Patient/family education;Cryotherapy;Therapeutic activities;Therapeutic exercise;Manual techniques;Balance training   PT Next Visit Plan Continue functional stretches with focus on quad stretch; functional strengthening including lunges and step ups; rocker board, improve bilateral hip IR.  Focus on knee and hip  ROM next session.Continue focus on gait and stair training.    PT Home Exercise Plan given   Consulted and Agree with Plan of Care Patient        Problem List Patient Active Problem List   Diagnosis Date Noted  . Muscle spasm 02/12/2014  . Arthritis of knee, right 01/30/2014  . Osteoarthritis   . Rotator cuff tear arthropathy 12/11/2013  . Sciatica 10/03/2013  . Primary osteoarthritis of right knee 10/03/2013  . GERD (gastroesophageal reflux disease) 10/23/2012  . Chest pain 10/22/2012  . OA (osteoarthritis) of knee 06/27/2012  . Colon cancer 02/01/2012  . Iron deficiency anemia 02/01/2012  . MEMORY LOSS 08/15/2008  . KNEE PAIN, RIGHT 08/12/2008  . CONSTIPATION 03/01/2008  . Spinal stenosis of lumbar region 03/01/2008  . HYPERTENSION, BENIGN ESSENTIAL 01/19/2008  . SHOULDER PAIN, RIGHT 12/12/2007  . POSTMENOPAUSAL OSTEOPOROSIS 12/12/2006  . HYPERLIPIDEMIA 10/07/2006  . OVERACTIVE BLADDER 10/07/2006  . ARTHRITIS 10/07/2006    Deniece Ree PT, DPT [place] [address] [place], [place], [address] Phone: [phone]   Fax:  [phone]

## 2014-05-07 ENCOUNTER — Ambulatory Visit (INDEPENDENT_AMBULATORY_CARE_PROVIDER_SITE_OTHER): Payer: Medicare Other | Admitting: Orthopedic Surgery

## 2014-05-07 ENCOUNTER — Ambulatory Visit (HOSPITAL_COMMUNITY): Payer: Medicare Other | Attending: Internal Medicine | Admitting: Physical Therapy

## 2014-05-07 ENCOUNTER — Telehealth (HOSPITAL_COMMUNITY): Payer: Self-pay | Admitting: Physical Therapy

## 2014-05-07 VITALS — BP 121/79 | Ht 63.0 in | Wt 145.0 lb

## 2014-05-07 DIAGNOSIS — Z96651 Presence of right artificial knee joint: Secondary | ICD-10-CM | POA: Diagnosis not present

## 2014-05-07 DIAGNOSIS — M6281 Muscle weakness (generalized): Secondary | ICD-10-CM | POA: Insufficient documentation

## 2014-05-07 DIAGNOSIS — Z471 Aftercare following joint replacement surgery: Secondary | ICD-10-CM | POA: Insufficient documentation

## 2014-05-07 DIAGNOSIS — R269 Unspecified abnormalities of gait and mobility: Secondary | ICD-10-CM | POA: Insufficient documentation

## 2014-05-07 DIAGNOSIS — M25561 Pain in right knee: Secondary | ICD-10-CM | POA: Insufficient documentation

## 2014-05-07 NOTE — Progress Notes (Signed)
Chief Complaint  Patient presents with  . Follow-up    5 week post op Right TKA, DOS 01/30/14    The patient had a knee replacement over 3 months ago she is doing well she still using a cane still in therapy but progressing with excellent range of motion 0-120  Her knee looks good and stable she can return in 3 months

## 2014-05-07 NOTE — Telephone Encounter (Signed)
Called patient regarding her missed appointment this morning. Family member answered the phone and stated that patient was not at home right now. Left message reminding patient about time and date of next appointment (May 5th at 2:30pm).  Deniece Ree PT, DPT [phone]

## 2014-05-09 ENCOUNTER — Ambulatory Visit (HOSPITAL_COMMUNITY): Payer: Medicare Other | Admitting: Physical Therapy

## 2014-05-14 ENCOUNTER — Telehealth (HOSPITAL_COMMUNITY): Payer: Self-pay | Admitting: Physical Therapy

## 2014-05-14 ENCOUNTER — Ambulatory Visit (HOSPITAL_COMMUNITY): Payer: Medicare Other | Admitting: Physical Therapy

## 2014-05-14 NOTE — Therapy (Signed)
[place], [place], [address] Phone: [phone]   Fax:  [phone]  Patient Details  Name: Sandra Williams MRN: [id number] Date of Birth: 01-Mar-1925 Referring Provider:  Ricard Dillon, MD  Encounter Date: 05/14/14  PHYSICAL THERAPY DISCHARGE SUMMARY  Visits from Start of Care: 8  Current functional level related to goals / functional outcomes: Patient has not shown up for 3 consecutive appointments at this time. Discharge per clinic policy.    Remaining deficits: Unable to assess at this time as patient has not arrived for PT treatments since late April, however at time of last treatment session patient did continue to demonstrate weakness in the knee as well as difficulty on stair descent.    Education / Equipment: Left message with family member stating that patient will need new MD referral to continue with skilled PT services.  Plan: Patient agrees to discharge.  Patient goals were not met. Patient is being discharged due to not returning since the last visit.  ?????       Deniece Ree PT, DPT [facility] [address] [place], [place], [address] Phone: [phone]   Fax:  [phone]

## 2014-05-14 NOTE — Therapy (Signed)
[place] [address] [place], [place], [address] Phone: [phone]   Fax:  [phone]  Patient Details  Name: Sandra Williams MRN: [id number] Date of Birth: 1925-08-10 Referring Provider:  Ricard Dillon, MD  Encounter Date: 05/14/14    [place] 05/14/2014, 11:55 AM  [place], [place], [address] Phone: [phone]   Fax:  [phone]

## 2014-05-14 NOTE — Telephone Encounter (Signed)
Patient has had 3 consecutive no-shows. Called home number and left message with family member that per clinic policy, patient will be discharged and that she will need a new MD referral to continue with skilled PT services.   Deniece Ree PT, DPT [phone]

## 2014-05-16 ENCOUNTER — Encounter (HOSPITAL_COMMUNITY): Payer: Medicare Other | Admitting: Physical Therapy

## 2014-05-21 ENCOUNTER — Encounter (HOSPITAL_COMMUNITY): Payer: Medicare Other | Admitting: Physical Therapy

## 2014-05-23 ENCOUNTER — Encounter (HOSPITAL_COMMUNITY): Payer: Medicare Other | Admitting: Physical Therapy

## 2014-07-11 ENCOUNTER — Ambulatory Visit (HOSPITAL_COMMUNITY): Payer: Medicare Other | Admitting: Oncology

## 2014-07-15 DIAGNOSIS — I10 Essential (primary) hypertension: Secondary | ICD-10-CM | POA: Diagnosis not present

## 2014-07-15 DIAGNOSIS — K219 Gastro-esophageal reflux disease without esophagitis: Secondary | ICD-10-CM | POA: Diagnosis not present

## 2014-07-15 DIAGNOSIS — E784 Other hyperlipidemia: Secondary | ICD-10-CM | POA: Diagnosis not present

## 2014-07-15 DIAGNOSIS — I2510 Atherosclerotic heart disease of native coronary artery without angina pectoris: Secondary | ICD-10-CM | POA: Diagnosis not present

## 2014-07-17 ENCOUNTER — Encounter (HOSPITAL_COMMUNITY): Payer: Medicare Other | Attending: Oncology | Admitting: Oncology

## 2014-07-17 ENCOUNTER — Encounter (HOSPITAL_COMMUNITY): Payer: Medicare Other | Attending: Hematology & Oncology

## 2014-07-17 ENCOUNTER — Encounter (HOSPITAL_COMMUNITY): Payer: Self-pay | Admitting: Oncology

## 2014-07-17 VITALS — BP 144/51 | HR 69 | Temp 98.2°F | Resp 18 | Wt 139.6 lb

## 2014-07-17 DIAGNOSIS — D509 Iron deficiency anemia, unspecified: Secondary | ICD-10-CM

## 2014-07-17 DIAGNOSIS — Z85038 Personal history of other malignant neoplasm of large intestine: Secondary | ICD-10-CM

## 2014-07-17 DIAGNOSIS — R195 Other fecal abnormalities: Secondary | ICD-10-CM | POA: Diagnosis not present

## 2014-07-17 DIAGNOSIS — D649 Anemia, unspecified: Secondary | ICD-10-CM | POA: Diagnosis not present

## 2014-07-17 DIAGNOSIS — C189 Malignant neoplasm of colon, unspecified: Secondary | ICD-10-CM | POA: Diagnosis not present

## 2014-07-17 LAB — CBC WITH DIFFERENTIAL/PLATELET
BASOS ABS: 0 10*3/uL (ref 0.0–0.1)
BASOS PCT: 1 % (ref 0–1)
Eosinophils Absolute: 0.2 10*3/uL (ref 0.0–0.7)
Eosinophils Relative: 4 % (ref 0–5)
HCT: 36.9 % (ref 36.0–46.0)
HEMOGLOBIN: 12 g/dL (ref 12.0–15.0)
LYMPHS ABS: 1.8 10*3/uL (ref 0.7–4.0)
LYMPHS PCT: 38 % (ref 12–46)
MCH: 28.3 pg (ref 26.0–34.0)
MCHC: 32.5 g/dL (ref 30.0–36.0)
MCV: 87 fL (ref 78.0–100.0)
MONO ABS: 0.4 10*3/uL (ref 0.1–1.0)
Monocytes Relative: 7 % (ref 3–12)
NEUTROS PCT: 50 % (ref 43–77)
Neutro Abs: 2.4 10*3/uL (ref 1.7–7.7)
Platelets: 292 10*3/uL (ref 150–400)
RBC: 4.24 MIL/uL (ref 3.87–5.11)
RDW: 16.1 % — AB (ref 11.5–15.5)
WBC: 4.8 10*3/uL (ref 4.0–10.5)

## 2014-07-17 LAB — IRON AND TIBC
Iron: 49 ug/dL (ref 28–170)
Saturation Ratios: 15 % (ref 10.4–31.8)
TIBC: 336 ug/dL (ref 250–450)
UIBC: 287 ug/dL

## 2014-07-17 LAB — COMPREHENSIVE METABOLIC PANEL
ALBUMIN: 3.7 g/dL (ref 3.5–5.0)
ALK PHOS: 72 U/L (ref 38–126)
ALT: 15 U/L (ref 14–54)
AST: 21 U/L (ref 15–41)
Anion gap: 3 — ABNORMAL LOW (ref 5–15)
BUN: 17 mg/dL (ref 6–20)
CALCIUM: 8.7 mg/dL — AB (ref 8.9–10.3)
CO2: 29 mmol/L (ref 22–32)
CREATININE: 0.7 mg/dL (ref 0.44–1.00)
Chloride: 110 mmol/L (ref 101–111)
GFR calc Af Amer: >60 mL/min (ref >60)
Glucose, Bld: 94 mg/dL (ref 65–99)
Potassium: 4.2 mmol/L (ref 3.5–5.1)
SODIUM: 142 mmol/L (ref 135–145)
Total Bilirubin: 0.7 mg/dL (ref 0.3–1.2)
Total Protein: 6.9 g/dL (ref 6.5–8.1)

## 2014-07-17 LAB — VITAMIN B12: VITAMIN B 12: 353 pg/mL (ref 180–914)

## 2014-07-17 LAB — FERRITIN: Ferritin: 29 ng/mL (ref 11–307)

## 2014-07-17 LAB — FOLATE: FOLATE: 28 ng/mL (ref >5.9)

## 2014-07-17 NOTE — Patient Instructions (Signed)
..[place] [facility] Discharge Instructions  RECOMMENDATIONS MADE BY THE CONSULTANT AND ANY TEST RESULTS WILL BE SENT TO YOUR REFERRING PHYSICIAN.  Labs today  Labs in 1 year and return to see [place] in 1 year  You may be having reaction to something that is sprayed on home grown tomatoes (not the tomatoes) We will see you sooner if needed  We will get you to collect 3 stool cards, instructions are attached  Thank you for choosing [place] at [facility] to provide your oncology and hematology care.  To afford each patient quality time with our provider, please arrive at least 15 minutes before your scheduled appointment time.    You need to re-schedule your appointment should you arrive 10 or more minutes late.  We strive to give you quality time with our providers, and arriving late affects you and other patients whose appointments are after yours.  Also, if you no show three or more times for appointments you may be dismissed from the clinic at the providers discretion.     Again, thank you for choosing [facility].  Our hope is that these requests will decrease the amount of time that you wait before being seen by our physicians.       _____________________________________________________________  Should you have questions after your visit to [facility], please contact our office at [phone] between the hours of 8:30 a.m. and 4:30 p.m.  Voicemails left after 4:30 p.m. will not be returned until the following business day.  For prescription refill requests, have your pharmacy contact our office.     Fecal Occult Blood Test This is a test done on a stool specimen to screen for gastrointestinal bleeding, which may be an indicator of colon cancer Is is usually done as part of a routine examination, annually, after age 40 or as directed by your caregiver. The fecal occult blood test (FOBT) checks for blood in  your stool. Normally, there will not be enough blood lost through the gastrointestinal tract to turn an FOBT positive or for you to notice it visually in the form of bloody or dark, tarry stools. Any significant amount of blood being passed should be investigated.  A positive FOBT will tell your caregiver that you have bleeding occurring somewhere in your gastrointestinal tract. This blood loss could be due to ulcers, diverticulosis, bleeding polyps, inflammatory bowel disease, hemorrhoids, from swallowed blood due to bleeding gums or nosebleeds, or it could be due to benign or cancerous tumors. Anything that protrudes into the lumen (the empty space in the intestine), like a polyp or tumor, and is rubbed against by the fecal waste as it passes through has the potential to eventually bleed intermittently. Often this small amount of blood is the first, and sometimes the only, symptom of early colon cancer, making the FOBT a valuable screening tool. PREPARATION FOR TEST  You should not eat red meat within three days before testing. Other substances that could cause a false positive test result include fish, turnips, horseradish, and drugs such as colchicines and oxidizing drugs (for example, iodine and boric acid). Be sure to carefully follow your caregiver's instructions. With FOBT, your caregiver or laboratory will give you one or more test "cards." You collect a separate sample from three different stools, usually on consecutive days. Each stool sample should be collected into a clean container and should not be contaminated with urine or water. The  slide is labeled with your name and the date; then, with an applicator stick, you apply a thin smear of stool onto each filter paper square/window contained on the card. Allow the filter paper to dry. Once it is dry, it is stable. Usually you will collect all of the consecutive samples, and then return all of them to your caregiver or laboratory at the same time,  sometimes by mailing them. There are also over the counter tests which are dropped in your toilet. NORMAL FINDINGS   No occult blood within the stool.  The FOBT test is normally negative. A positive indicates either blood in the stool or an interfering substance. Multiple samples are done to: 1) catch intermittent bleeding; and 2) help rule out false positives. Ranges for normal findings may vary among different laboratories and hospitals. You should always check with your doctor after having lab work or other tests done to discuss the meaning of your test results and whether your values are considered within normal limits. MEANING OF TEST  Your caregiver will go over the test results with you and discuss the importance and meaning of your results, as well as treatment options and the need for additional tests if necessary. OBTAINING THE TEST RESULTS  It is your responsibility to obtain your test results. Ask the lab or department performing the test when and how you will get your results. Document Released: 01/16/2004 Document Revised: 03/15/2011 Document Reviewed: 12/01/2007 [facility] Patient Information 2015 [place], [place]. This information is not intended to replace advice given to you by your health care provider. Make sure you discuss any questions you have with your health care provider.

## 2014-07-17 NOTE — Assessment & Plan Note (Addendum)
Iron deficiency anemia at the time of presentation from her Stage I CRC.    She is now S/P R total knee arthroplasty and she is noted to be anemic.  She has not been previously anemic.    Labs today: CBC diff, anemia panel.  Will do stool cards x 3 due to dark stools.  Return in 1 year for follow-up, sooner if needed.  Depending on lab tests from today, we may ask her to come in sooner than planned for labs and/or follow-up appointment.

## 2014-07-17 NOTE — Progress Notes (Signed)
[place], MD [place] [id number]  Colon cancer - Plan: Vitamin B12, Folate, Iron and TIBC, Ferritin, CBC with Differential, Comprehensive metabolic panel, aspirin 81 MG tablet, Vitamin B12, Folate, Iron and TIBC, Ferritin, CBC with Differential, Comprehensive metabolic panel, Occult blood card to lab, stool, Occult blood card to lab, stool, Occult blood card to lab, stool  Iron deficiency anemia - Plan: Vitamin B12, Folate, Iron and TIBC, Ferritin, CBC with Differential, Comprehensive metabolic panel, aspirin 81 MG tablet, Vitamin B12, Folate, Iron and TIBC, Ferritin, CBC with Differential, Comprehensive metabolic panel, Occult blood card to lab, stool, Occult blood card to lab, stool, Occult blood card to lab, stool  Dark stools - Plan: Vitamin B12, Folate, Iron and TIBC, Ferritin, CBC with Differential, Comprehensive metabolic panel, aspirin 81 MG tablet, Vitamin B12, Folate, Iron and TIBC, Ferritin, CBC with Differential, Comprehensive metabolic panel, Occult blood card to lab, stool, Occult blood card to lab, stool, Occult blood card to lab, stool  CURRENT THERAPY: Surveillance per NCCN guidelines  INTERVAL HISTORY: Sandra Williams 81 y.o. female returns for  regular  visit for followup of Stage I (T2, N0, M0) cancer the cecum status post resection by Dr. Arnoldo Morale with 14 lymph nodes found all of which were negative. She had no evidence for metastatic disease on CT scan of the abdomen and pelvis either. Her date of surgery was 10/22/2011. Not in need of chemotherapy for her stage I cancer. AND Iron deficiency anemia at the time of presentation.    Colon cancer   09/24/2011 Pathology Results 1. Colon, biopsy, cecal mass - INVASIVE ADENOCARCINOMA. 2. Colon, polyp(s), transverse - TUBULAR ADENOMA(TWO FRAGMENTS). NO HIGH GRADE DYSPLASIA OR MALIGNANCY IDENTIFIED.   09/24/2011 Imaging CT abd/pelvis- Focal wall thickening in the cecum, consistent with recently  diagnosed cecal carcinoma. No evidence of metastatic disease.   10/22/2011 Definitive Surgery Colon, segmental resection for tumor, right - INVASIVE COLORECTAL ADENOCARCINOMA, EXTENDING INTO MUSCULARIS PROPRIA. - MARGINS NOT INVOLVED. - FOURTEEN BENIGN LYMPH NODES (0/14).    I personally reviewed and went over laboratory results with the patient.  The results are noted within this dictation.  Her Hgb is noted to be in the 10 g/dL range which is below her baseline.  In Jan 2016, Sandra Williams underwent a R total knee arthroplasty.  She subsequently went to the [facility] facility for rehabilitation and subsequently discharged.    She denies any blood in stool, but does note some dark stools. I will check stool cards.  Otherwise, she denies any oncology complaints and ROS questioning is negative.    Past Medical History  Diagnosis Date  . Osteoporosis   . Arthritis   . HTN (hypertension)   . Hyperlipidemia   . Cancer     cecal carcinoma  . GERD (gastroesophageal reflux disease)   . Colon cancer 02/01/2012    Stage I (T2, N0, M0) cancer the cecum status post resection by Dr. Arnoldo Morale with 14 lymph nodes found all of which were negative. She had no evidence for metastatic disease on CT scan of the abdomen and pelvis either. Her date of surgery was 10/22/2011.  Not in need of chemotherapy for her stage I cancer.   . Iron deficiency anemia 02/01/2012    At time of colon cancer presentation  . Knee joint pain 2014    right    has HYPERLIPIDEMIA; HYPERTENSION, BENIGN ESSENTIAL; CONSTIPATION; OVERACTIVE BLADDER; ARTHRITIS; SHOULDER PAIN, RIGHT; KNEE PAIN, RIGHT; Spinal stenosis of lumbar region; POSTMENOPAUSAL OSTEOPOROSIS;  MEMORY LOSS; Colon cancer; Iron deficiency anemia; OA (osteoarthritis) of knee; Chest pain; GERD (gastroesophageal reflux disease); Sciatica; Primary osteoarthritis of right knee; Rotator cuff tear arthropathy; Osteoarthritis; Arthritis of knee, right; and Muscle spasm on her  problem list.     is allergic to aspirin.  Sandra Williams had no medications administered during this visit.  Past Surgical History  Procedure Laterality Date  . Abdominal hysterectomy      partial  . Cataract extraction Bilateral   . Cesarean section    . Dental implant    . Appendectomy      [facility]  . Partial colectomy  10/22/2011    Procedure: PARTIAL COLECTOMY;  Surgeon: Jamesetta So, MD;  Location: [facility];  Service: General;  Laterality: N/A;  . Colon surgery    . Total knee arthroplasty Right 01/30/2014    Procedure: TOTAL KNEE ARTHROPLASTY;  Surgeon: Carole Civil, MD;  Location: [facility];  Service: Orthopedics;  Laterality: Right;    Denies any headaches, dizziness, double vision, fevers, chills, night sweats, nausea, vomiting, diarrhea, constipation, chest pain, heart palpitations, shortness of breath, blood in stool, black tarry stool, urinary pain, urinary burning, urinary frequency, hematuria.   PHYSICAL EXAMINATION  ECOG PERFORMANCE STATUS: 1 - Symptomatic but completely ambulatory  Filed Vitals:   07/17/14 1007  BP: 144/51  Pulse: 69  Temp: 98.2 F (36.8 C)  Resp: 18    GENERAL:alert, no distress, well nourished, well developed, comfortable, cooperative and smiling SKIN: skin color, texture, turgor are normal, no rashes or significant lesions HEAD: Normocephalic, No masses, lesions, tenderness or abnormalities EYES: normal, PERRLA, EOMI, Conjunctiva are pink and non-injected EARS: External ears normal OROPHARYNX:mucous membranes are moist  NECK: supple, thyroid normal size, non-tender, without nodularity, no stridor, non-tender, trachea midline LYMPH:  no palpable lymphadenopathy BREAST:not examined LUNGS: clear to auscultation  HEART: regular rate & rhythm, no murmurs and no gallops ABDOMEN:abdomen soft, non-tender and normal bowel sounds BACK: Back symmetric, no curvature. EXTREMITIES:less then 2 second capillary refill, no joint deformities, effusion,  or inflammation, no edema, no skin discoloration, no clubbing, no cyanosis  NEURO: alert & oriented x 3 with fluent speech, no focal motor/sensory deficits, gait normal   LABORATORY DATA: CBC    Component Value Date/Time   WBC 5.0 03/11/2014 0630   RBC 3.91 03/11/2014 0630   HGB 10.6* 03/11/2014 0630   HCT 34.8* 03/11/2014 0630   PLT 334 03/11/2014 0630   MCV 89.0 03/11/2014 0630   MCH 27.1 03/11/2014 0630   MCHC 30.5 03/11/2014 0630   RDW 14.9 03/11/2014 0630   LYMPHSABS 2.0 03/11/2014 0630   MONOABS 0.5 03/11/2014 0630   EOSABS 0.2 03/11/2014 0630   BASOSABS 0.0 03/11/2014 0630      Chemistry      Component Value Date/Time   NA 142 03/11/2014 0630   K 4.6 03/11/2014 0630   CL 109 03/11/2014 0630   CO2 28 03/11/2014 0630   BUN 16 03/11/2014 0630   CREATININE 0.73 03/11/2014 0630      Component Value Date/Time   CALCIUM 9.0 03/11/2014 0630   ALKPHOS 80 07/05/2013 0926   AST 20 07/05/2013 0926   ALT 14 07/05/2013 0926   BILITOT 0.3 07/05/2013 0926     Lab Results  Component Value Date   CEA 1.8 07/05/2013      ASSESSMENT/PLAN:   Iron deficiency anemia Iron deficiency anemia at the time of presentation from her Stage I CRC.    She is now S/P  R total knee arthroplasty and she is noted to be anemic.  She has not been previously anemic.    Labs today: CBC diff, anemia panel.  Return in 1 year for follow-up, sooner if needed.  Depending on lab tests from today, we may ask her to come in sooner than planned for labs and/or follow-up appointment.  Colon cancer Stage I (T2, N0, M0) cancer the cecum status post resection by Dr. Arnoldo Morale with 14 lymph nodes found all of which were negative. She had no evidence for metastatic disease on CT scan of the abdomen and pelvis either. Her date of surgery was 10/22/2011. Not in need of chemotherapy for her stage I cancer.   NED  Labs today and in 1 year: CBC diff, CMET   THERAPY PLAN:  NCCN guidelines recommends a  colonoscopy within 1 year following surgery for a T2 N0 M0 lesion of colon. She would be a candidate for repeat colonoscopy due to a sessile polyp found at time of diagnosis but if she desires conservative monitoring, I would gladly oblige due to her age and future life expectancy.    All questions were answered. The patient knows to call the clinic with any problems, questions or concerns. We can certainly see the patient much sooner if necessary.  Patient and plan discussed with Dr. Ancil Linsey and she is in agreement with the aforementioned.   Sandra Williams 07/17/2014

## 2014-07-17 NOTE — Progress Notes (Signed)
Labs drawn

## 2014-07-17 NOTE — Assessment & Plan Note (Signed)
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 Arnoldo Morale with 14 lymph nodes found all of which were negative. She had no evidence for metastatic disease on CT scan of the abdomen and pelvis either. Her date of surgery was 10/22/2011. Not in need of chemotherapy for her stage I cancer.   NED  Labs today and in 1 year: CBC diff, CMET

## 2014-08-04 NOTE — Progress Notes (Signed)
REVIEWED. CONTACT PT FOR SCREENING TCS.

## 2014-08-08 ENCOUNTER — Ambulatory Visit (INDEPENDENT_AMBULATORY_CARE_PROVIDER_SITE_OTHER): Payer: Medicare Other | Admitting: Orthopedic Surgery

## 2014-08-08 ENCOUNTER — Encounter: Payer: Self-pay | Admitting: Orthopedic Surgery

## 2014-08-08 VITALS — BP 162/73 | Ht 63.0 in | Wt 139.0 lb

## 2014-08-08 DIAGNOSIS — Z96651 Presence of right artificial knee joint: Secondary | ICD-10-CM

## 2014-08-08 NOTE — Progress Notes (Signed)
Patient ID: Sandra Williams, female   DOB: [birth year], 81 y.o.   MRN: [id number]  Follow up visit  Chief Complaint  Patient presents with  . Follow-up    post op right tka 01/30/14    BP 162/73 mmHg  Ht 5\' 3"  (1.6 m)  Wt 139 lb (63.05 kg)  BMI 24.63 kg/m2  Encounter Diagnosis  Name Primary?  . Status post right knee replacement Yes     6-1/2 months status post right total knee she's doing very well she is walking independently her knee range of motion is just shy of full extension -100 18 flexion   no pain no swelling no effusion incision healed nicely follow-up next year for x-ray

## 2014-10-06 ENCOUNTER — Emergency Department (HOSPITAL_COMMUNITY): Payer: Medicare Other

## 2014-10-06 ENCOUNTER — Emergency Department (HOSPITAL_COMMUNITY)
Admission: EM | Admit: 2014-10-06 | Discharge: 2014-10-06 | Disposition: A | Discharge status: Home / Self Care | Payer: Medicare Other | Attending: Emergency Medicine | Admitting: Emergency Medicine

## 2014-10-06 ENCOUNTER — Encounter (HOSPITAL_COMMUNITY): Payer: Self-pay | Admitting: Emergency Medicine

## 2014-10-06 DIAGNOSIS — R42 Dizziness and giddiness: Secondary | ICD-10-CM | POA: Diagnosis not present

## 2014-10-06 DIAGNOSIS — Z85038 Personal history of other malignant neoplasm of large intestine: Secondary | ICD-10-CM | POA: Diagnosis not present

## 2014-10-06 DIAGNOSIS — I10 Essential (primary) hypertension: Secondary | ICD-10-CM | POA: Insufficient documentation

## 2014-10-06 DIAGNOSIS — E785 Hyperlipidemia, unspecified: Secondary | ICD-10-CM | POA: Diagnosis not present

## 2014-10-06 DIAGNOSIS — R197 Diarrhea, unspecified: Secondary | ICD-10-CM | POA: Diagnosis not present

## 2014-10-06 LAB — COMPREHENSIVE METABOLIC PANEL
ALBUMIN: 3.7 g/dL (ref 3.5–5.0)
ALT: 17 U/L (ref 14–54)
ANION GAP: 5 (ref 5–15)
AST: 23 U/L (ref 15–41)
Alkaline Phosphatase: 70 U/L (ref 38–126)
BUN: 22 mg/dL — ABNORMAL HIGH (ref 6–20)
CHLORIDE: 111 mmol/L (ref 101–111)
CO2: 26 mmol/L (ref 22–32)
Calcium: 8.6 mg/dL — ABNORMAL LOW (ref 8.9–10.3)
Creatinine, Ser: 0.84 mg/dL (ref 0.44–1.00)
GFR calc Af Amer: >60 mL/min (ref >60)
GFR calc non Af Amer: >60 mL/min (ref >60)
GLUCOSE: 146 mg/dL — AB (ref 65–99)
POTASSIUM: 3.7 mmol/L (ref 3.5–5.1)
SODIUM: 142 mmol/L (ref 135–145)
TOTAL PROTEIN: 7 g/dL (ref 6.5–8.1)
Total Bilirubin: 0.4 mg/dL (ref 0.3–1.2)

## 2014-10-06 LAB — CBC WITH DIFFERENTIAL/PLATELET
BASOS ABS: 0 10*3/uL (ref 0.0–0.1)
BASOS PCT: 0 %
EOS ABS: 0.3 10*3/uL (ref 0.0–0.7)
Eosinophils Relative: 4 %
HCT: 38.2 % (ref 36.0–46.0)
Hemoglobin: 12.2 g/dL (ref 12.0–15.0)
Lymphocytes Relative: 35 %
Lymphs Abs: 2.4 10*3/uL (ref 0.7–4.0)
MCH: 28.4 pg (ref 26.0–34.0)
MCHC: 31.9 g/dL (ref 30.0–36.0)
MCV: 89 fL (ref 78.0–100.0)
MONO ABS: 0.7 10*3/uL (ref 0.1–1.0)
MONOS PCT: 10 %
Neutro Abs: 3.4 10*3/uL (ref 1.7–7.7)
Neutrophils Relative %: 51 %
PLATELETS: 280 10*3/uL (ref 150–400)
RBC: 4.29 MIL/uL (ref 3.87–5.11)
RDW: 14 % (ref 11.5–15.5)
WBC: 6.8 10*3/uL (ref 4.0–10.5)

## 2014-10-06 LAB — URINALYSIS, ROUTINE W REFLEX MICROSCOPIC
BILIRUBIN URINE: NEGATIVE
Glucose, UA: NEGATIVE mg/dL
Ketones, ur: NEGATIVE mg/dL
Leukocytes, UA: NEGATIVE
NITRITE: NEGATIVE
Protein, ur: NEGATIVE mg/dL
SPECIFIC GRAVITY, URINE: 1.02 (ref 1.005–1.030)
UROBILINOGEN UA: 0.2 mg/dL (ref 0.0–1.0)
pH: 6 (ref 5.0–8.0)

## 2014-10-06 LAB — URINE MICROSCOPIC-ADD ON

## 2014-10-06 LAB — PROTIME-INR
INR: 1.1 (ref 0.00–1.49)
PROTHROMBIN TIME: 14.4 s (ref 11.6–15.2)

## 2014-10-06 LAB — TROPONIN I: Troponin I: <0.03 ng/mL (ref <0.031)

## 2014-10-06 MED ORDER — MECLIZINE HCL 12.5 MG PO TABS
25.0000 mg | ORAL_TABLET | Freq: Once | ORAL | Status: AC
  Administered 2014-10-06: 25 mg via ORAL
  Filled 2014-10-06: qty 2

## 2014-10-06 MED ORDER — MECLIZINE HCL 25 MG PO TABS: 25.0000 mg | ORAL_TABLET | Freq: Three times a day (TID) | ORAL | Status: DC | PRN

## 2014-10-06 MED ORDER — ONDANSETRON HCL 4 MG/2ML IJ SOLN
4.0000 mg | Freq: Once | INTRAMUSCULAR | Status: AC
  Administered 2014-10-06: 4 mg via INTRAVENOUS
  Filled 2014-10-06: qty 2

## 2014-10-06 NOTE — ED Provider Notes (Signed)
CSN: [id number]     Arrival date & time 10/06/14  6720 History   First MD Initiated Contact with Patient 10/06/14 0534     Chief Complaint  Patient presents with  . Dizziness     (Consider location/radiation/quality/duration/timing/severity/associated sxs/prior Treatment) HPI Comments: Patient present with dizziness described as both lightheadedness and vertigo upon awaking from sleep around 4 AM. She describes the room spinning is worse when she tries to stand up. Symptoms started when she tried to get out of bed to go to the bathroom. She was last seen normal about midnight when she went to bed. She is having difficulty walking due to dizziness. She's not had these symptoms before. Denies any headache, chest pain, shortness of breath, abdominal pain. She has some nausea but no vomiting. Denies any visual change. No focal weakness, numbness or tingling. No bowel or bladder incontinence. She no longer takes Coumadin.  The history is provided by the patient and a relative. The history is limited by the condition of the patient.    Past Medical History  Diagnosis Date  . Osteoporosis   . Arthritis   . HTN (hypertension)   . Hyperlipidemia   . Cancer ([place])     cecal carcinoma  . GERD (gastroesophageal reflux disease)   . Colon cancer ([place]) 02/01/2012    Stage I (T2, N0, M0) cancer the cecum status post resection by Dr. Arnoldo Morale with 14 lymph nodes found all of which were negative. She had no evidence for metastatic disease on CT scan of the abdomen and pelvis either. Her date of surgery was 10/22/2011.  Not in need of chemotherapy for her stage I cancer.   . Iron deficiency anemia 02/01/2012    At time of colon cancer presentation  . Knee joint pain 2014    right   Past Surgical History  Procedure Laterality Date  . Abdominal hysterectomy      partial  . Cataract extraction Bilateral   . Cesarean section    . Dental implant    . Appendectomy      [facility]  . Partial colectomy  10/22/2011     Procedure: PARTIAL COLECTOMY;  Surgeon: Jamesetta So, MD;  Location: [facility];  Service: General;  Laterality: N/A;  . Colon surgery    . Total knee arthroplasty Right 01/30/2014    Procedure: TOTAL KNEE ARTHROPLASTY;  Surgeon: Carole Civil, MD;  Location: [facility];  Service: Orthopedics;  Laterality: Right;   Family History  Problem Relation Age of Onset  . Breast cancer Daughter   . Hypertension Daughter   . Colon cancer Neg Hx   . Liver disease Neg Hx   . Hypertension Mother   . Cancer Sister    Social History  Substance Use Topics  . Smoking status: Never Smoker   . Smokeless tobacco: Never Used  . Alcohol Use: No   OB History    No data available     Review of Systems  Constitutional: Negative for fever, activity change and appetite change.  HENT: Negative for congestion and rhinorrhea.   Eyes: Negative for visual disturbance.  Respiratory: Negative for cough, chest tightness and shortness of breath.   Cardiovascular: Negative for chest pain.  Gastrointestinal: Positive for nausea. Negative for vomiting and abdominal pain.  Genitourinary: Negative for dysuria, hematuria, vaginal bleeding and vaginal discharge.  Musculoskeletal: Negative for myalgias and arthralgias.  Skin: Negative for rash.  Neurological: Positive for dizziness and light-headedness. Negative for weakness, numbness and headaches.  A complete 10 system review of systems was obtained and all systems are negative except as noted in the HPI and PMH.      Allergies  Aspirin  Home Medications   Prior to Admission medications   Medication Sig Start Date End Date Taking? Authorizing Provider  aspirin 81 MG tablet Take 81 mg by mouth daily.    Historical Provider, MD  docusate sodium (COLACE) 100 MG capsule Take 100 mg by mouth 2 (two) times daily.    Historical Provider, MD  HYDROcodone-acetaminophen (NORCO/VICODIN) 5-325 MG per tablet Take 1 tablet by mouth every 4 (four) hours as needed for  moderate pain (DO NOT EXCEED 3GM OF APAP IN 24 HOURS FROM ALL SOURCES). 03/13/14   Estill Dooms, MD  losartan (COZAAR) 100 MG tablet Take 100 mg by mouth every morning.     Historical Provider, MD  Multiple Vitamin (MULTIVITAMIN WITH MINERALS) TABS tablet Take 1 tablet by mouth daily.    Historical Provider, MD  polyethylene glycol (MIRALAX / GLYCOLAX) packet Take 17 g by mouth daily.    Historical Provider, MD  pravastatin (PRAVACHOL) 40 MG tablet Take 40 mg by mouth every morning.     Historical Provider, MD  Probiotic Product (PRO-BIOTIC BLEND) CAPS Take by mouth.    Historical Provider, MD  warfarin (COUMADIN) 2.5 MG tablet Take 1 tablet (2.5 mg total) by mouth daily at 6 PM. Patient not taking: Reported on 07/17/2014 02/02/14   Carole Civil, MD   BP 167/57 mmHg  Pulse 54  Temp(Src) 98.2 F (36.8 C) (Oral)  Resp 16  Ht 5\' 1"  (1.549 m)  Wt 130 lb (58.968 kg)  BMI 24.58 kg/m2  SpO2 98% Physical Exam  Constitutional: She is oriented to person, place, and time. She appears well-developed and well-nourished. No distress.  Anxious  HENT:  Head: Normocephalic and atraumatic.  Mouth/Throat: Oropharynx is clear and moist. No oropharyngeal exudate.  Eyes: Conjunctivae and EOM are normal. Pupils are equal, round, and reactive to light.  Neck: Normal range of motion. Neck supple.  No meningismus.  Cardiovascular: Normal rate, regular rhythm, normal heart sounds and intact distal pulses.   No murmur heard. Pulmonary/Chest: Effort normal and breath sounds normal. No respiratory distress.  Abdominal: Soft. There is no tenderness. There is no rebound and no guarding.  Musculoskeletal: Normal range of motion. She exhibits no edema or tenderness.  Neurological: She is alert and oriented to person, place, and time. No cranial nerve deficit. She exhibits normal muscle tone. Coordination normal.  Intention tremor bilaterally (new per patient), No ataxia on finger to nose bilaterally. No pronator  drift. 5/5 strength throughout. CN 2-12 intact. Equal grip strength. Sensation intact. Positive Romberg, unable to stand without assistance, unable to ambulate. No nystagmus, head impulse testing negative.  Skin: Skin is warm.  Psychiatric: She has a normal mood and affect. Her behavior is normal.  Nursing note and vitals reviewed.   ED Course  Procedures (including critical care time) Labs Review Labs Reviewed  COMPREHENSIVE METABOLIC PANEL - Abnormal; Notable for the following:    Glucose, Bld 146 (*)    BUN 22 (*)    Calcium 8.6 (*)    All other components within normal limits  CBC WITH DIFFERENTIAL/PLATELET  TROPONIN I  PROTIME-INR  URINALYSIS, ROUTINE W REFLEX MICROSCOPIC (NOT AT [facility])    Imaging Review Ct Head Wo Contrast  10/06/2014   CLINICAL DATA:  Dizziness  EXAM: CT HEAD WITHOUT CONTRAST  TECHNIQUE: Contiguous axial images  were obtained from the base of the skull through the vertex without intravenous contrast.  COMPARISON:  01/21/2012 brain MRI  FINDINGS: Skull and Sinuses:Negative for fracture or destructive process. The mastoids, middle ears, and imaged paranasal sinuses are clear.  Orbits: No acute abnormality.  Brain: No evidence of acute infarction, hemorrhage, hydrocephalus, or mass lesion/mass effect. Generalized cerebral atrophy, expected for age. Normal small-vessel ischemic change with dilated perivascular spaces at the inferior sylvian fissures.  IMPRESSION: No acute finding or change since 2014.   Electronically Signed   By: Monte Fantasia M.D.   On: 10/06/2014 06:48   I have personally reviewed and evaluated these images and lab results as part of my medical decision-making.   EKG Interpretation   Date/Time:  Sunday October 06 2014 05:28:21 EDT Ventricular Rate:  52 PR Interval:  199 QRS Duration: 85 QT Interval:  462 QTC Calculation: 430 R Axis:   21 Text Interpretation:  Sinus rhythm Low voltage, precordial leads Left  ventricular hypertrophy  Baseline wander in lead(s) II aVF No significant  change was found Confirmed by Wyvonnia Dusky  MD, Marian Meneely ([id number]) on 10/06/2014  5:51:31 AM      MDM   Final diagnoses:  Vertigo   Dizziness and vertigo that onset this morning worse with position change. Last seen normal last night.  No ataxia on finger to nose. No nystagmus.   Unable to stand without assistance.  CT head negative. Patient feels somewhat improved after meclizine and Zofran. However she still needs assistance to stand and cannot ambulate on her own.  MRI will be obtained to evaluate for possible posterior circulation infarct. MRI is not available at this facility today.  D/w Dr. Doy Mince who [facility] She will need to ambulate after the MRI if it does not show an infarct.   Ezequiel Essex, MD 10/06/14 [phone]

## 2014-10-06 NOTE — Discharge Instructions (Signed)
Make an appointment to follow-up with your doctor. Take the Antivert as needed. Your MRI of the brain was negative for stroke.

## 2014-10-06 NOTE — ED Provider Notes (Signed)
Patient transferred for MRI to rule out a posterior type stroke. Patient not on blood thinners. Patient with difficulty walking due to dizziness. Symptoms started at 4 this morning. MRI already ordered. We'll need to ambulate patient if MRI is negative.  Results for orders placed or performed during the hospital encounter of 10/06/14  CBC with Differential/Platelet  Result Value Ref Range   WBC 6.8 4.0 - 10.5 K/uL   RBC 4.29 3.87 - 5.11 MIL/uL   Hemoglobin 12.2 12.0 - 15.0 g/dL   HCT 38.2 36.0 - 46.0 %   MCV 89.0 78.0 - 100.0 fL   MCH 28.4 26.0 - 34.0 pg   MCHC 31.9 30.0 - 36.0 g/dL   RDW 14.0 11.5 - 15.5 %   Platelets 280 150 - 400 K/uL   Neutrophils Relative % 51 %   Neutro Abs 3.4 1.7 - 7.7 K/uL   Lymphocytes Relative 35 %   Lymphs Abs 2.4 0.7 - 4.0 K/uL   Monocytes Relative 10 %   Monocytes Absolute 0.7 0.1 - 1.0 K/uL   Eosinophils Relative 4 %   Eosinophils Absolute 0.3 0.0 - 0.7 K/uL   Basophils Relative 0 %   Basophils Absolute 0.0 0.0 - 0.1 K/uL  Comprehensive metabolic panel  Result Value Ref Range   Sodium 142 135 - 145 mmol/L   Potassium 3.7 3.5 - 5.1 mmol/L   Chloride 111 101 - 111 mmol/L   CO2 26 22 - 32 mmol/L   Glucose, Bld 146 (H) 65 - 99 mg/dL   BUN 22 (H) 6 - 20 mg/dL   Creatinine, Ser 0.84 0.44 - 1.00 mg/dL   Calcium 8.6 (L) 8.9 - 10.3 mg/dL   Total Protein 7.0 6.5 - 8.1 g/dL   Albumin 3.7 3.5 - 5.0 g/dL   AST 23 15 - 41 U/L   ALT 17 14 - 54 U/L   Alkaline Phosphatase 70 38 - 126 U/L   Total Bilirubin 0.4 0.3 - 1.2 mg/dL   GFR calc non Af Amer >60 >60 mL/min   GFR calc Af Amer >60 >60 mL/min   Anion gap 5 5 - 15  Troponin I  Result Value Ref Range   Troponin I <0.03 <0.031 ng/mL  Urinalysis, Routine w reflex microscopic (not at [facility])  Result Value Ref Range   Color, Urine YELLOW YELLOW   APPearance CLEAR CLEAR   Specific Gravity, Urine 1.020 1.005 - 1.030   pH 6.0 5.0 - 8.0   Glucose, UA NEGATIVE NEGATIVE mg/dL   Hgb urine dipstick TRACE (A)  NEGATIVE   Bilirubin Urine NEGATIVE NEGATIVE   Ketones, ur NEGATIVE NEGATIVE mg/dL   Protein, ur NEGATIVE NEGATIVE mg/dL   Urobilinogen, UA 0.2 0.0 - 1.0 mg/dL   Nitrite NEGATIVE NEGATIVE   Leukocytes, UA NEGATIVE NEGATIVE  Protime-INR  Result Value Ref Range   Prothrombin Time 14.4 11.6 - 15.2 seconds   INR 1.10 0.00 - 1.49  Urine microscopic-add on  Result Value Ref Range   Squamous Epithelial / LPF FEW (A) RARE   RBC / HPF 0-2 <3 RBC/hpf   Bacteria, UA RARE RARE   Ct Head Wo Contrast  10/06/2014   CLINICAL DATA:  Dizziness  EXAM: CT HEAD WITHOUT CONTRAST  TECHNIQUE: Contiguous axial images were obtained from the base of the skull through the vertex without intravenous contrast.  COMPARISON:  01/21/2012 brain MRI  FINDINGS: Skull and Sinuses:Negative for fracture or destructive process. The mastoids, middle ears, and imaged paranasal sinuses are clear.  Orbits: No acute abnormality.  Brain: No evidence of acute infarction, hemorrhage, hydrocephalus, or mass lesion/mass effect. Generalized cerebral atrophy, expected for age. Normal small-vessel ischemic change with dilated perivascular spaces at the inferior sylvian fissures.  IMPRESSION: No acute finding or change since 2014.   Electronically Signed   By: Monte Fantasia M.D.   On: 10/06/2014 06:48   Mr Brain Wo Contrast  10/06/2014   CLINICAL DATA:  81 year old hypertensive female presenting with dizziness and diarrhea. Subsequent encounter.  EXAM: MRI HEAD WITHOUT CONTRAST  TECHNIQUE: Multiplanar, multiecho pulse sequences of the brain and surrounding structures were obtained without intravenous contrast.  COMPARISON:  10/06/2014 head CT.  01/21/2012 brain MR.  FINDINGS: No acute infarct.  No intracranial hemorrhage.  Mild small vessel disease type changes.  Mild age related atrophy without hydrocephalus.  No intracranial mass lesion noted on this unenhanced exam.  Major intracranial vascular structures are patent.  Post lens replacement  otherwise orbital structures unremarkable.  Cervical spondylotic changes with spinal stenosis C3-4 through C5-6. Transverse ligament hypertrophy.  Superior contour or of the pituitary gland unchanged. Pineal region unremarkable.  IMPRESSION: No acute infarct.  No intracranial hemorrhage.  Mild small vessel disease type changes.  No intracranial mass lesion noted on this unenhanced exam.  Cervical spondylotic changes with spinal stenosis C3-4 through C5-6.   Electronically Signed   By: Genia Del M.D.   On: 10/06/2014 11:18   MRI of the brain showed no evidence of any acute stroke. Patient states dizziness has resolved. Patient was able to stand without any symptoms. Patient still has some antivertigo on board. We'll give her prescription for antivert and will have her follow-up with her regular doctor. Patient stable for discharge home.   Fredia Sorrow, MD 10/06/14 1242

## 2014-10-06 NOTE — ED Notes (Signed)
Patient transported to MRI 

## 2014-10-06 NOTE — ED Notes (Signed)
[facility] at bedside to transport pt to [facility] ED.

## 2014-10-06 NOTE — ED Notes (Signed)
Patient complaining of dizziness and nausea. States she feels as if the room is spinning. Reports started when she got out of bed this morning to go to the bathroom.

## 2014-10-06 NOTE — ED Notes (Signed)
Pt here via [facility] for MRI. Pt woke up around 0400 with dizziness and diarrhea. Pt was given Antivert and reports her dizziness is much better.

## 2014-10-10 DIAGNOSIS — I10 Essential (primary) hypertension: Secondary | ICD-10-CM | POA: Diagnosis not present

## 2014-10-10 DIAGNOSIS — E784 Other hyperlipidemia: Secondary | ICD-10-CM | POA: Diagnosis not present

## 2014-10-10 DIAGNOSIS — K219 Gastro-esophageal reflux disease without esophagitis: Secondary | ICD-10-CM | POA: Diagnosis not present

## 2015-01-16 DIAGNOSIS — E784 Other hyperlipidemia: Secondary | ICD-10-CM | POA: Diagnosis not present

## 2015-01-16 DIAGNOSIS — M810 Age-related osteoporosis without current pathological fracture: Secondary | ICD-10-CM | POA: Diagnosis not present

## 2015-01-16 DIAGNOSIS — I10 Essential (primary) hypertension: Secondary | ICD-10-CM | POA: Diagnosis not present

## 2015-01-30 ENCOUNTER — Ambulatory Visit (INDEPENDENT_AMBULATORY_CARE_PROVIDER_SITE_OTHER): Payer: Medicare Other | Admitting: Orthopedic Surgery

## 2015-01-30 ENCOUNTER — Ambulatory Visit (INDEPENDENT_AMBULATORY_CARE_PROVIDER_SITE_OTHER): Payer: Medicare Other

## 2015-01-30 ENCOUNTER — Ambulatory Visit: Payer: Medicare Other

## 2015-01-30 VITALS — BP 116/89 | Ht 61.0 in | Wt 141.0 lb

## 2015-01-30 DIAGNOSIS — Z96651 Presence of right artificial knee joint: Secondary | ICD-10-CM

## 2015-01-30 NOTE — Progress Notes (Signed)
Patient ID: Sandra Williams, female   DOB: Jan 12, 1925, 82 y.o.   MRN: [id number]  Post op annual TKA   Chief Complaint  Patient presents with  . Follow-up    follow up + xray Rt TKA, DOS 01/30/14    HPI Sandra Williams is a 82 y.o. female.  1 year follow-up x-ray right total knee surgery on 01/30/2014  She has no complaints she is ambulating well. No assistive devices needed. Review of systems negative.   Past Medical History  Diagnosis Date  . Osteoporosis   . Arthritis   . HTN (hypertension)   . Hyperlipidemia   . Cancer ([place])     cecal carcinoma  . GERD (gastroesophageal reflux disease)   . Colon cancer ([place]) 02/01/2012    Stage I (T2, N0, M0) cancer the cecum status post resection by Dr. Arnoldo Morale with 14 lymph nodes found all of which were negative. She had no evidence for metastatic disease on CT scan of the abdomen and pelvis either. Her date of surgery was 10/22/2011.  Not in need of chemotherapy for her stage I cancer.   . Iron deficiency anemia 02/01/2012    At time of colon cancer presentation  . Knee joint pain 2014    right    Past Surgical History  Procedure Laterality Date  . Abdominal hysterectomy      partial  . Cataract extraction Bilateral   . Cesarean section    . Dental implant    . Appendectomy      [facility]  . Partial colectomy  10/22/2011    Procedure: PARTIAL COLECTOMY;  Surgeon: Jamesetta So, MD;  Location: [facility];  Service: General;  Laterality: N/A;  . Colon surgery    . Total knee arthroplasty Right 01/30/2014    Procedure: TOTAL KNEE ARTHROPLASTY;  Surgeon: Carole Civil, MD;  Location: [facility];  Service: Orthopedics;  Laterality: Right;     Allergies  Allergen Reactions  . Aspirin     REACTION: Upset stomach if takes regular and uncoated     Current Outpatient Prescriptions  Medication Sig Dispense Refill  . aspirin 81 MG tablet Take 81 mg by mouth daily.    . pravastatin (PRAVACHOL) 40 MG tablet Take 40 mg by mouth every  morning.     Marland Kitchen HYDROcodone-acetaminophen (NORCO/VICODIN) 5-325 MG per tablet Take 1 tablet by mouth every 4 (four) hours as needed for moderate pain (DO NOT EXCEED 3GM OF APAP IN 24 HOURS FROM ALL SOURCES). (Patient not taking: Reported on 01/30/2015) 180 tablet 0   No current facility-administered medications for this visit.    Review of Systems Review of Systems Mild stiffness in the morning  Physical Exam Blood pressure 116/89, height 5\' 1"  (1.549 m), weight 141 lb (63.957 kg).   Gen. appearance is normal there are no congenital abnormalities   The patient is oriented 3   Mood and affect are normal   Ambulation is without assistive device   Knee inspection reveals a well-healed incision with no swelling   Knee flexion 120  Stability in the anteroposterior plane is normal as well as in the medial lateral plane  Motor exam reveals full extension without extensor lag   Data Reviewed KNEE XRAYS : Plain films normal  Assessment S/P TKA   Plan    Right total knee doing well follow-up in a year for repeat x-ray       Arther Abbott 01/30/2015, 3:02 PM

## 2015-04-09 ENCOUNTER — Encounter (HOSPITAL_COMMUNITY): Payer: Self-pay

## 2015-04-15 DIAGNOSIS — I10 Essential (primary) hypertension: Secondary | ICD-10-CM | POA: Diagnosis not present

## 2015-04-15 DIAGNOSIS — E784 Other hyperlipidemia: Secondary | ICD-10-CM | POA: Diagnosis not present

## 2015-04-15 DIAGNOSIS — K219 Gastro-esophageal reflux disease without esophagitis: Secondary | ICD-10-CM | POA: Diagnosis not present

## 2015-04-16 ENCOUNTER — Other Ambulatory Visit (HOSPITAL_COMMUNITY): Payer: Self-pay | Admitting: Internal Medicine

## 2015-04-16 DIAGNOSIS — Z780 Asymptomatic menopausal state: Secondary | ICD-10-CM

## 2015-04-16 DIAGNOSIS — M810 Age-related osteoporosis without current pathological fracture: Secondary | ICD-10-CM

## 2015-04-23 ENCOUNTER — Other Ambulatory Visit (HOSPITAL_COMMUNITY): Payer: Medicare Other

## 2015-05-20 DIAGNOSIS — R42 Dizziness and giddiness: Secondary | ICD-10-CM | POA: Diagnosis not present

## 2015-05-20 DIAGNOSIS — K219 Gastro-esophageal reflux disease without esophagitis: Secondary | ICD-10-CM | POA: Diagnosis not present

## 2015-05-20 DIAGNOSIS — E784 Other hyperlipidemia: Secondary | ICD-10-CM | POA: Diagnosis not present

## 2015-05-20 DIAGNOSIS — I509 Heart failure, unspecified: Secondary | ICD-10-CM | POA: Diagnosis not present

## 2015-05-20 DIAGNOSIS — D649 Anemia, unspecified: Secondary | ICD-10-CM | POA: Diagnosis not present

## 2015-05-20 DIAGNOSIS — I10 Essential (primary) hypertension: Secondary | ICD-10-CM | POA: Diagnosis not present

## 2015-05-28 DIAGNOSIS — H52203 Unspecified astigmatism, bilateral: Secondary | ICD-10-CM | POA: Diagnosis not present

## 2015-05-28 DIAGNOSIS — H524 Presbyopia: Secondary | ICD-10-CM | POA: Diagnosis not present

## 2015-05-28 DIAGNOSIS — H5213 Myopia, bilateral: Secondary | ICD-10-CM | POA: Diagnosis not present

## 2015-05-28 DIAGNOSIS — Z961 Presence of intraocular lens: Secondary | ICD-10-CM | POA: Diagnosis not present

## 2015-07-21 ENCOUNTER — Other Ambulatory Visit (HOSPITAL_COMMUNITY): Payer: Medicare Other

## 2015-07-21 ENCOUNTER — Ambulatory Visit (HOSPITAL_COMMUNITY): Payer: Medicare Other | Admitting: Oncology

## 2015-07-21 ENCOUNTER — Encounter (HOSPITAL_COMMUNITY): Payer: Medicare Other

## 2015-07-21 ENCOUNTER — Encounter (HOSPITAL_COMMUNITY): Payer: Medicare Other | Attending: Oncology | Admitting: Oncology

## 2015-07-21 ENCOUNTER — Encounter (HOSPITAL_COMMUNITY): Payer: Self-pay | Admitting: Oncology

## 2015-07-21 VITALS — BP 175/56 | HR 68 | Temp 97.4°F | Resp 16 | Wt 143.7 lb

## 2015-07-21 DIAGNOSIS — Z9889 Other specified postprocedural states: Secondary | ICD-10-CM | POA: Insufficient documentation

## 2015-07-21 DIAGNOSIS — D509 Iron deficiency anemia, unspecified: Secondary | ICD-10-CM | POA: Insufficient documentation

## 2015-07-21 DIAGNOSIS — Z803 Family history of malignant neoplasm of breast: Secondary | ICD-10-CM | POA: Insufficient documentation

## 2015-07-21 DIAGNOSIS — Z85038 Personal history of other malignant neoplasm of large intestine: Secondary | ICD-10-CM | POA: Diagnosis not present

## 2015-07-21 DIAGNOSIS — K219 Gastro-esophageal reflux disease without esophagitis: Secondary | ICD-10-CM | POA: Insufficient documentation

## 2015-07-21 DIAGNOSIS — Z808 Family history of malignant neoplasm of other organs or systems: Secondary | ICD-10-CM | POA: Diagnosis not present

## 2015-07-21 DIAGNOSIS — I10 Essential (primary) hypertension: Secondary | ICD-10-CM | POA: Diagnosis not present

## 2015-07-21 DIAGNOSIS — Z90710 Acquired absence of both cervix and uterus: Secondary | ICD-10-CM | POA: Diagnosis not present

## 2015-07-21 DIAGNOSIS — C182 Malignant neoplasm of ascending colon: Secondary | ICD-10-CM

## 2015-07-21 DIAGNOSIS — R195 Other fecal abnormalities: Secondary | ICD-10-CM

## 2015-07-21 DIAGNOSIS — C189 Malignant neoplasm of colon, unspecified: Secondary | ICD-10-CM

## 2015-07-21 LAB — CBC WITH DIFFERENTIAL/PLATELET
Basophils Absolute: 0 10*3/uL (ref 0.0–0.1)
Basophils Relative: 1 %
EOS ABS: 0.2 10*3/uL (ref 0.0–0.7)
EOS PCT: 4 %
HCT: 37.9 % (ref 36.0–46.0)
HEMOGLOBIN: 12.4 g/dL (ref 12.0–15.0)
Lymphocytes Relative: 42 %
Lymphs Abs: 2.1 10*3/uL (ref 0.7–4.0)
MCH: 28.6 pg (ref 26.0–34.0)
MCHC: 32.7 g/dL (ref 30.0–36.0)
MCV: 87.3 fL (ref 78.0–100.0)
MONO ABS: 0.3 10*3/uL (ref 0.1–1.0)
MONOS PCT: 6 %
Neutro Abs: 2.4 10*3/uL (ref 1.7–7.7)
Neutrophils Relative %: 47 %
PLATELETS: 243 10*3/uL (ref 150–400)
RBC: 4.34 MIL/uL (ref 3.87–5.11)
RDW: 15.1 % (ref 11.5–15.5)
WBC: 5.1 10*3/uL (ref 4.0–10.5)

## 2015-07-21 LAB — COMPREHENSIVE METABOLIC PANEL
ALK PHOS: 70 U/L (ref 38–126)
ALT: 17 U/L (ref 14–54)
ANION GAP: 6 (ref 5–15)
AST: 19 U/L (ref 15–41)
Albumin: 3.8 g/dL (ref 3.5–5.0)
BUN: 13 mg/dL (ref 6–20)
CALCIUM: 8.9 mg/dL (ref 8.9–10.3)
CO2: 25 mmol/L (ref 22–32)
Chloride: 111 mmol/L (ref 101–111)
Creatinine, Ser: 0.74 mg/dL (ref 0.44–1.00)
GFR calc non Af Amer: >60 mL/min (ref >60)
Glucose, Bld: 95 mg/dL (ref 65–99)
Potassium: 4 mmol/L (ref 3.5–5.1)
Sodium: 142 mmol/L (ref 135–145)
TOTAL PROTEIN: 7.1 g/dL (ref 6.5–8.1)
Total Bilirubin: 0.6 mg/dL (ref 0.3–1.2)

## 2015-07-21 LAB — IRON AND TIBC
Iron: 58 ug/dL (ref 28–170)
SATURATION RATIOS: 17 % (ref 10.4–31.8)
TIBC: 347 ug/dL (ref 250–450)
UIBC: 289 ug/dL

## 2015-07-21 LAB — FERRITIN: Ferritin: 37 ng/mL (ref 11–307)

## 2015-07-21 NOTE — Assessment & Plan Note (Addendum)
Iron deficiency anemia at the time of presentation from her Stage I CRC.    Labs today: CBC diff, CMET, iron/TIBC, ferritin.  I personally reviewed and went over laboratory results with the patient.  The results are noted within this dictation.  HGB is WNL today.  Ferritin is stable.  Return in 1 year for follow-up, sooner if needed.

## 2015-07-21 NOTE — Progress Notes (Signed)
[place], [place] [address]  Iron deficiency anemia - Plan: Ferritin, Iron and TIBC, CBC with Differential, Iron and TIBC, Ferritin  Malignant neoplasm of ascending colon ([place]) - Plan: CBC with Differential, Comprehensive metabolic panel  CURRENT THERAPY: Surveillance per NCCN guidelines  INTERVAL HISTORY: Sandra Williams 82 y.o. female returns for followup of  Stage I (T2, N0, M0) cancer the cecum status post resection by Dr. Arnoldo Morale with 14 lymph nodes found all of which were negative. She had no evidence for metastatic disease on CT scan of the abdomen and pelvis either. Her date of surgery was 10/22/2011. Not in need of chemotherapy for her stage I cancer.  AND  Iron deficiency anemia at the time of presentation.     Colon cancer ([place])   09/24/2011 Pathology Results 1. Colon, biopsy, cecal mass - INVASIVE ADENOCARCINOMA. 2. Colon, polyp(s), transverse - TUBULAR ADENOMA(TWO FRAGMENTS). NO HIGH GRADE DYSPLASIA OR MALIGNANCY IDENTIFIED.   09/24/2011 Imaging CT abd/pelvis- Focal wall thickening in the cecum, consistent with recently diagnosed cecal carcinoma. No evidence of metastatic disease.   10/22/2011 Definitive Surgery Colon, segmental resection for tumor, right - INVASIVE COLORECTAL ADENOCARCINOMA, EXTENDING INTO MUSCULARIS PROPRIA. - MARGINS NOT INVOLVED. - FOURTEEN BENIGN LYMPH NODES (0/14).    They patient is now 81 year old.  She is accompanied by her daughter.  She is doing well and denies any complaints.  Her bowels are at baseline without changes in caliber, frequency, or consistency.  She notes a BM approximately 15-30 minutes postprandially.  She notes about 3 BMs per day.  Review of Systems  Constitutional: Negative for fever, chills and weight loss.  HENT: Negative.   Eyes: Negative.   Respiratory: Negative.   Cardiovascular: Negative.   Gastrointestinal: Negative.  Negative for nausea, vomiting, abdominal pain,  diarrhea, constipation, blood in stool and melena.  Genitourinary: Negative.   Musculoskeletal: Negative.   Skin: Negative.   Neurological: Negative.  Negative for weakness.  Endo/Heme/Allergies: Negative.   Psychiatric/Behavioral: Negative.     Past Medical History  Diagnosis Date  . Osteoporosis   . Arthritis   . HTN (hypertension)   . Hyperlipidemia   . Cancer ([place])     cecal carcinoma  . GERD (gastroesophageal reflux disease)   . Colon cancer ([place]) 02/01/2012    Stage I (T2, N0, M0) cancer the cecum status post resection by Dr. Arnoldo Morale with 14 lymph nodes found all of which were negative. She had no evidence for metastatic disease on CT scan of the abdomen and pelvis either. Her date of surgery was 10/22/2011.  Not in need of chemotherapy for her stage I cancer.   . Iron deficiency anemia 02/01/2012    At time of colon cancer presentation  . Knee joint pain 2014    right    Past Surgical History  Procedure Laterality Date  . Abdominal hysterectomy      partial  . Cataract extraction Bilateral   . Cesarean section    . Dental implant    . Appendectomy      [facility]  . Partial colectomy  10/22/2011    Procedure: PARTIAL COLECTOMY;  Surgeon: Jamesetta So, MD;  Location: [facility];  Service: General;  Laterality: N/A;  . Colon surgery    . Total knee arthroplasty Right 01/30/2014    Procedure: TOTAL KNEE ARTHROPLASTY;  Surgeon: Carole Civil, MD;  Location: [facility];  Service: Orthopedics;  Laterality: Right;  Family History  Problem Relation Age of Onset  . Breast cancer Daughter   . Hypertension Daughter   . Colon cancer Neg Hx   . Liver disease Neg Hx   . Hypertension Mother   . Cancer Sister     Social History   Social History  . Marital Status: Single    Spouse Name: N/A  . Number of Children: N/A  . Years of Education: N/A   Social History Main Topics  . Smoking status: Never Smoker   . Smokeless tobacco: Never Used  . Alcohol Use: No  . Drug Use: No    . Sexual Activity: No   Other Topics Concern  . None   Social History Narrative     PHYSICAL EXAMINATION  ECOG PERFORMANCE STATUS: 1 - Symptomatic but completely ambulatory  Filed Vitals:   07/21/15 1011  BP: 175/56  Pulse: 68  Temp: 97.4 F (36.3 C)  Resp: 16    GENERAL:alert, no distress, well nourished, well developed, comfortable, cooperative, smiling and accompanied by her daughter who is on her cell phone throughout office visit on hold with "[employer]" SKIN: skin color, texture, turgor are normal, no rashes or significant lesions HEAD: Normocephalic, No masses, lesions, tenderness or abnormalities EYES: normal, EOMI, Conjunctiva are pink and non-injected EARS: External ears normal OROPHARYNX:lips, buccal mucosa, and tongue normal and mucous membranes are moist  NECK: supple, no adenopathy, trachea midline LYMPH:  no palpable lymphadenopathy BREAST:not examined LUNGS: clear to auscultation  HEART: regular rate & rhythm ABDOMEN:abdomen soft, non-tender, obese and normal bowel sounds BACK: Back symmetric, no curvature. EXTREMITIES:less then 2 second capillary refill, no joint deformities, effusion, or inflammation, no skin discoloration, no cyanosis  NEURO: alert & oriented x 3 with fluent speech, no focal motor/sensory deficits, gait normal with the assistance of a walking cane.   LABORATORY DATA: CBC    Component Value Date/Time   WBC 5.1 07/21/2015 0934   RBC 4.34 07/21/2015 0934   HGB 12.4 07/21/2015 0934   HCT 37.9 07/21/2015 0934   PLT 243 07/21/2015 0934   MCV 87.3 07/21/2015 0934   MCH 28.6 07/21/2015 0934   MCHC 32.7 07/21/2015 0934   RDW 15.1 07/21/2015 0934   LYMPHSABS 2.1 07/21/2015 0934   MONOABS 0.3 07/21/2015 0934   EOSABS 0.2 07/21/2015 0934   BASOSABS 0.0 07/21/2015 0934      Chemistry      Component Value Date/Time   NA 142 07/21/2015 0934   K 4.0 07/21/2015 0934   CL 111 07/21/2015 0934   CO2 25 07/21/2015 0934   BUN 13 07/21/2015  0934   CREATININE 0.74 07/21/2015 0934      Component Value Date/Time   CALCIUM 8.9 07/21/2015 0934   ALKPHOS 70 07/21/2015 0934   AST 19 07/21/2015 0934   ALT 17 07/21/2015 0934   BILITOT 0.6 07/21/2015 0934      Lab Results  Component Value Date   CEA 1.8 07/05/2013   Lab Results  Component Value Date   IRON 58 07/21/2015   TIBC 347 07/21/2015   FERRITIN 37 07/21/2015     PENDING LABS:   RADIOGRAPHIC STUDIES:  No results found.   PATHOLOGY:    ASSESSMENT AND PLAN:  Iron deficiency anemia Iron deficiency anemia at the time of presentation from her Stage I CRC.    Labs today: CBC diff, CMET, iron/TIBC, ferritin.  I personally reviewed and went over laboratory results with the patient.  The results are noted within this dictation.  HGB is WNL today.  Ferritin is stable.  Return in 1 year for follow-up, sooner if needed.   Colon cancer Stage I (T2, N0, M0) cancer the cecum status post resection by Dr. Arnoldo Morale with 14 lymph nodes found all of which were negative. She had no evidence for metastatic disease on CT scan of the abdomen and pelvis either. Her date of surgery was 10/22/2011. Not in need of chemotherapy for her stage I cancer.   NED    ORDERS PLACED FOR THIS ENCOUNTER: Orders Placed This Encounter  Procedures  . Ferritin  . Iron and TIBC  . CBC with Differential  . Comprehensive metabolic panel  . Iron and TIBC  . Ferritin    MEDICATIONS PRESCRIBED THIS ENCOUNTER: Meds ordered this encounter  Medications  . losartan (COZAAR) 100 MG tablet    Sig: Take 100 mg by mouth daily.    THERAPY PLAN:  NCCN guidelines for surveillance for Colon cancer are as follows (1.2017):  A. Stage I   1. Colonoscopy at year 1    A. If advanced adenoma, repeat in 1 year    B. If no advanced adenoma, repeat in 3 years, and then every 5 years.  B. Stage II, Stage III   1. H+P every 3-6 months x 2 years and then every 6 months for a total of 5 years    2. CEA  every 3-6 months x 2 years and then every 6 months for a total of 5 years    3. CT CAP every 6-12 months (category 2B for frequency < 12 months) for a total of 5 years .   4.  Colonoscopy in 1 year except if no preoperative colonoscopy due to obstructing lesion, colonoscopy in 3-6 months.     A. If advanced adenoma, repeat in 1 year    B. If no advanced adenoma, repeat in 3 years, then every 5 years   5. PET/CT scan is not recommended.  C. Stage IV   1. H+P every 3-6 months x 2 years and then every 6 months for a total of 5 years    2. CEA every 3 months x 2 years and then every 6 months for a total of 3- 5 years    3. CT CAP every 3-6 months (category 2B for frequency < 6 months) x 2 years., then every 6-12 months for a total of 5 years .   4. Colonoscopy in 1 year except if no preoperative colonoscopy due to obstructing lesion, colonoscopy in 3-6 months.     A. If advanced adenoma, repeat in 1 year    B. If no advanced adenoma, repeat in 3 years, then every 5 years   All questions were answered. The patient knows to call the clinic with any problems, questions or concerns. We can certainly see the patient much sooner if necessary.  Patient and plan discussed with Dr. Ancil Linsey and she is in agreement with the aforementioned.   This note is electronically signed by: Doy Mince 07/21/2015 8:57 PM

## 2015-07-21 NOTE — Patient Instructions (Signed)
[place] at [facility] Discharge Instructions  RECOMMENDATIONS MADE BY THE CONSULTANT AND ANY TEST RESULTS WILL BE SENT TO YOUR REFERRING PHYSICIAN.  Labs today are good.  Some labs are pending, namely your iron studies. We will recheck labs in 1 year. We will see you back in 1 year for follow-up.  Thank you for choosing [place] at [facility] to provide your oncology and hematology care.  To afford each patient quality time with our provider, please arrive at least 15 minutes before your scheduled appointment time.   Beginning January 23rd 2017 lab work for the [facility] will be done in the  Main lab at [facility] on 1st floor. If you have a lab appointment with the [facility] please come in thru the  Main Entrance and check in at the main information desk  You need to re-schedule your appointment should you arrive 10 or more minutes late.  We strive to give you quality time with our providers, and arriving late affects you and other patients whose appointments are after yours.  Also, if you no show three or more times for appointments you may be dismissed from the clinic at the providers discretion.     Again, thank you for choosing [facility].  Our hope is that these requests will decrease the amount of time that you wait before being seen by our physicians.       _____________________________________________________________  Should you have questions after your visit to [facility], please contact our office at [phone] between the hours of 8:30 a.m. and 4:30 p.m.  Voicemails left after 4:30 p.m. will not be returned until the following business day.  For prescription refill requests, have your pharmacy contact our office.         Resources For Cancer Patients and their Caregivers ? American Cancer Society: Can assist with transportation, wigs, general needs, runs Look Good Feel Better.         510-887-8395 ? Cancer Care: Provides financial assistance, online support groups, medication/co-pay assistance.  1-800-813-HOPE 567 710 2004) ? [facility] Assists [place] cancer patients and their families through emotional , educational and financial support.  904-308-4948 ? [place] DSS Where to apply for food stamps, Medicaid and utility assistance. 434-443-6953 ? [facility]: Transportation to medical appointments. 980 666 6440 ? Social Security Administration: May apply for disability if have a Stage IV cancer. 934 141 6490 671 188 6983 ? [employer], Disability and Transit Services: Assists with nutrition, care and transit needs. [place] Support Programs: @10RELATIVEDAYS @ > Cancer Support Group  2nd Tuesday of the month 1pm-2pm, Journey Room  > Creative Journey  3rd Tuesday of the month 1130am-1pm, Journey Room  > Look Good Feel Better  1st Wednesday of the month 10am-12 noon, Journey Room (Call [place] to register [phone])

## 2015-07-21 NOTE — Assessment & Plan Note (Signed)
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 Arnoldo Morale with 14 lymph nodes found all of which were negative. She had no evidence for metastatic disease on CT scan of the abdomen and pelvis either. Her date of surgery was 10/22/2011. Not in need of chemotherapy for her stage I cancer.   NED

## 2015-07-22 DIAGNOSIS — M810 Age-related osteoporosis without current pathological fracture: Secondary | ICD-10-CM | POA: Diagnosis not present

## 2015-07-22 DIAGNOSIS — I10 Essential (primary) hypertension: Secondary | ICD-10-CM | POA: Diagnosis not present

## 2015-07-22 DIAGNOSIS — E784 Other hyperlipidemia: Secondary | ICD-10-CM | POA: Diagnosis not present

## 2015-07-22 DIAGNOSIS — I2510 Atherosclerotic heart disease of native coronary artery without angina pectoris: Secondary | ICD-10-CM | POA: Diagnosis not present

## 2015-09-05 ENCOUNTER — Other Ambulatory Visit: Payer: Self-pay

## 2015-09-19 DIAGNOSIS — I10 Essential (primary) hypertension: Secondary | ICD-10-CM | POA: Diagnosis not present

## 2015-09-19 DIAGNOSIS — E784 Other hyperlipidemia: Secondary | ICD-10-CM | POA: Diagnosis not present

## 2015-09-19 DIAGNOSIS — M810 Age-related osteoporosis without current pathological fracture: Secondary | ICD-10-CM | POA: Diagnosis not present

## 2015-09-19 DIAGNOSIS — I2510 Atherosclerotic heart disease of native coronary artery without angina pectoris: Secondary | ICD-10-CM | POA: Diagnosis not present

## 2015-09-19 DIAGNOSIS — Z23 Encounter for immunization: Secondary | ICD-10-CM | POA: Diagnosis not present

## 2015-10-21 ENCOUNTER — Ambulatory Visit (HOSPITAL_COMMUNITY)
Admission: RE | Admit: 2015-10-21 | Discharge: 2015-10-21 | Disposition: A | Discharge status: Home / Self Care | Payer: Medicare Other | Source: Ambulatory Visit | Attending: Internal Medicine | Admitting: Internal Medicine

## 2015-10-21 ENCOUNTER — Other Ambulatory Visit (HOSPITAL_COMMUNITY): Payer: Self-pay | Admitting: Internal Medicine

## 2015-10-21 DIAGNOSIS — R52 Pain, unspecified: Secondary | ICD-10-CM

## 2015-10-21 DIAGNOSIS — M25552 Pain in left hip: Secondary | ICD-10-CM | POA: Diagnosis not present

## 2015-10-21 DIAGNOSIS — M810 Age-related osteoporosis without current pathological fracture: Secondary | ICD-10-CM | POA: Diagnosis not present

## 2015-10-21 DIAGNOSIS — M5136 Other intervertebral disc degeneration, lumbar region: Secondary | ICD-10-CM | POA: Diagnosis not present

## 2015-10-21 DIAGNOSIS — I10 Essential (primary) hypertension: Secondary | ICD-10-CM | POA: Diagnosis not present

## 2016-01-20 DIAGNOSIS — I509 Heart failure, unspecified: Secondary | ICD-10-CM | POA: Diagnosis not present

## 2016-01-20 DIAGNOSIS — K219 Gastro-esophageal reflux disease without esophagitis: Secondary | ICD-10-CM | POA: Diagnosis not present

## 2016-01-20 DIAGNOSIS — I2510 Atherosclerotic heart disease of native coronary artery without angina pectoris: Secondary | ICD-10-CM | POA: Diagnosis not present

## 2016-01-20 DIAGNOSIS — I10 Essential (primary) hypertension: Secondary | ICD-10-CM | POA: Diagnosis not present

## 2016-01-20 DIAGNOSIS — R5383 Other fatigue: Secondary | ICD-10-CM | POA: Diagnosis not present

## 2016-01-20 DIAGNOSIS — D649 Anemia, unspecified: Secondary | ICD-10-CM | POA: Diagnosis not present

## 2016-02-09 ENCOUNTER — Ambulatory Visit (INDEPENDENT_AMBULATORY_CARE_PROVIDER_SITE_OTHER): Payer: Medicare Other | Admitting: Orthopedic Surgery

## 2016-02-09 ENCOUNTER — Ambulatory Visit (INDEPENDENT_AMBULATORY_CARE_PROVIDER_SITE_OTHER): Payer: Medicare Other

## 2016-02-09 DIAGNOSIS — M1710 Unilateral primary osteoarthritis, unspecified knee: Secondary | ICD-10-CM | POA: Diagnosis not present

## 2016-02-09 DIAGNOSIS — Z96651 Presence of right artificial knee joint: Secondary | ICD-10-CM

## 2016-02-09 NOTE — Progress Notes (Signed)
ANNUAL FOLLOW UP FOR  RIGHT  TKA   Chief Complaint  Patient presents with  . Follow-up    RT TKA, DOS 01/30/14     HPI: The patient is here for the annual  follow-up x-ray for knee replacement   System review No complaints on the musculoskeletal system   Examination of the right KNEE   Inspection shows : incision healed nicely without erythema, no tenderness no swelling  Range of motion total range of motion is 115 degrees  Stability the knee is stable anterior to posterior as well as medial to lateral  Strength quadriceps strength is normal  Skin no erythema around the skin incision  Cardiovascular NO EDEMA    Medical decision-making section  X-rays ordered with the following personal interpretation  Stable knee without loosening  Diagnosis  Plan follow-up 1 year repeat x-rays

## 2016-04-20 DIAGNOSIS — M810 Age-related osteoporosis without current pathological fracture: Secondary | ICD-10-CM | POA: Diagnosis not present

## 2016-04-20 DIAGNOSIS — K219 Gastro-esophageal reflux disease without esophagitis: Secondary | ICD-10-CM | POA: Diagnosis not present

## 2016-04-20 DIAGNOSIS — I10 Essential (primary) hypertension: Secondary | ICD-10-CM | POA: Diagnosis not present

## 2016-07-20 ENCOUNTER — Other Ambulatory Visit (HOSPITAL_COMMUNITY): Payer: Medicare Other

## 2016-07-20 ENCOUNTER — Ambulatory Visit (HOSPITAL_COMMUNITY): Payer: Medicare Other | Admitting: Oncology

## 2016-07-27 DIAGNOSIS — E784 Other hyperlipidemia: Secondary | ICD-10-CM | POA: Diagnosis not present

## 2016-07-27 DIAGNOSIS — M810 Age-related osteoporosis without current pathological fracture: Secondary | ICD-10-CM | POA: Diagnosis not present

## 2016-07-27 DIAGNOSIS — I2510 Atherosclerotic heart disease of native coronary artery without angina pectoris: Secondary | ICD-10-CM | POA: Diagnosis not present

## 2016-07-27 DIAGNOSIS — B079 Viral wart, unspecified: Secondary | ICD-10-CM | POA: Diagnosis not present

## 2016-07-27 DIAGNOSIS — R071 Chest pain on breathing: Secondary | ICD-10-CM | POA: Diagnosis not present

## 2016-07-27 DIAGNOSIS — K219 Gastro-esophageal reflux disease without esophagitis: Secondary | ICD-10-CM | POA: Diagnosis not present

## 2016-07-27 DIAGNOSIS — C189 Malignant neoplasm of colon, unspecified: Secondary | ICD-10-CM | POA: Diagnosis not present

## 2016-07-27 DIAGNOSIS — I10 Essential (primary) hypertension: Secondary | ICD-10-CM | POA: Diagnosis not present

## 2016-07-27 DIAGNOSIS — F17210 Nicotine dependence, cigarettes, uncomplicated: Secondary | ICD-10-CM | POA: Diagnosis not present

## 2016-10-26 DIAGNOSIS — M810 Age-related osteoporosis without current pathological fracture: Secondary | ICD-10-CM | POA: Diagnosis not present

## 2016-10-26 DIAGNOSIS — R300 Dysuria: Secondary | ICD-10-CM | POA: Diagnosis not present

## 2016-10-26 DIAGNOSIS — N390 Urinary tract infection, site not specified: Secondary | ICD-10-CM | POA: Diagnosis not present

## 2016-10-26 DIAGNOSIS — I10 Essential (primary) hypertension: Secondary | ICD-10-CM | POA: Diagnosis not present

## 2016-12-24 DIAGNOSIS — I10 Essential (primary) hypertension: Secondary | ICD-10-CM | POA: Diagnosis not present

## 2016-12-24 DIAGNOSIS — M810 Age-related osteoporosis without current pathological fracture: Secondary | ICD-10-CM | POA: Diagnosis not present

## 2016-12-24 DIAGNOSIS — J069 Acute upper respiratory infection, unspecified: Secondary | ICD-10-CM | POA: Diagnosis not present

## 2016-12-30 IMAGING — CR DG KNEE 1-2V PORT*R*
2 series · 2 of 2 positions shown · non-contrast
Comparison: None.

CLINICAL DATA: Total right knee replacement.

EXAM:
PORTABLE RIGHT KNEE - 1-2 VIEW

[ap]
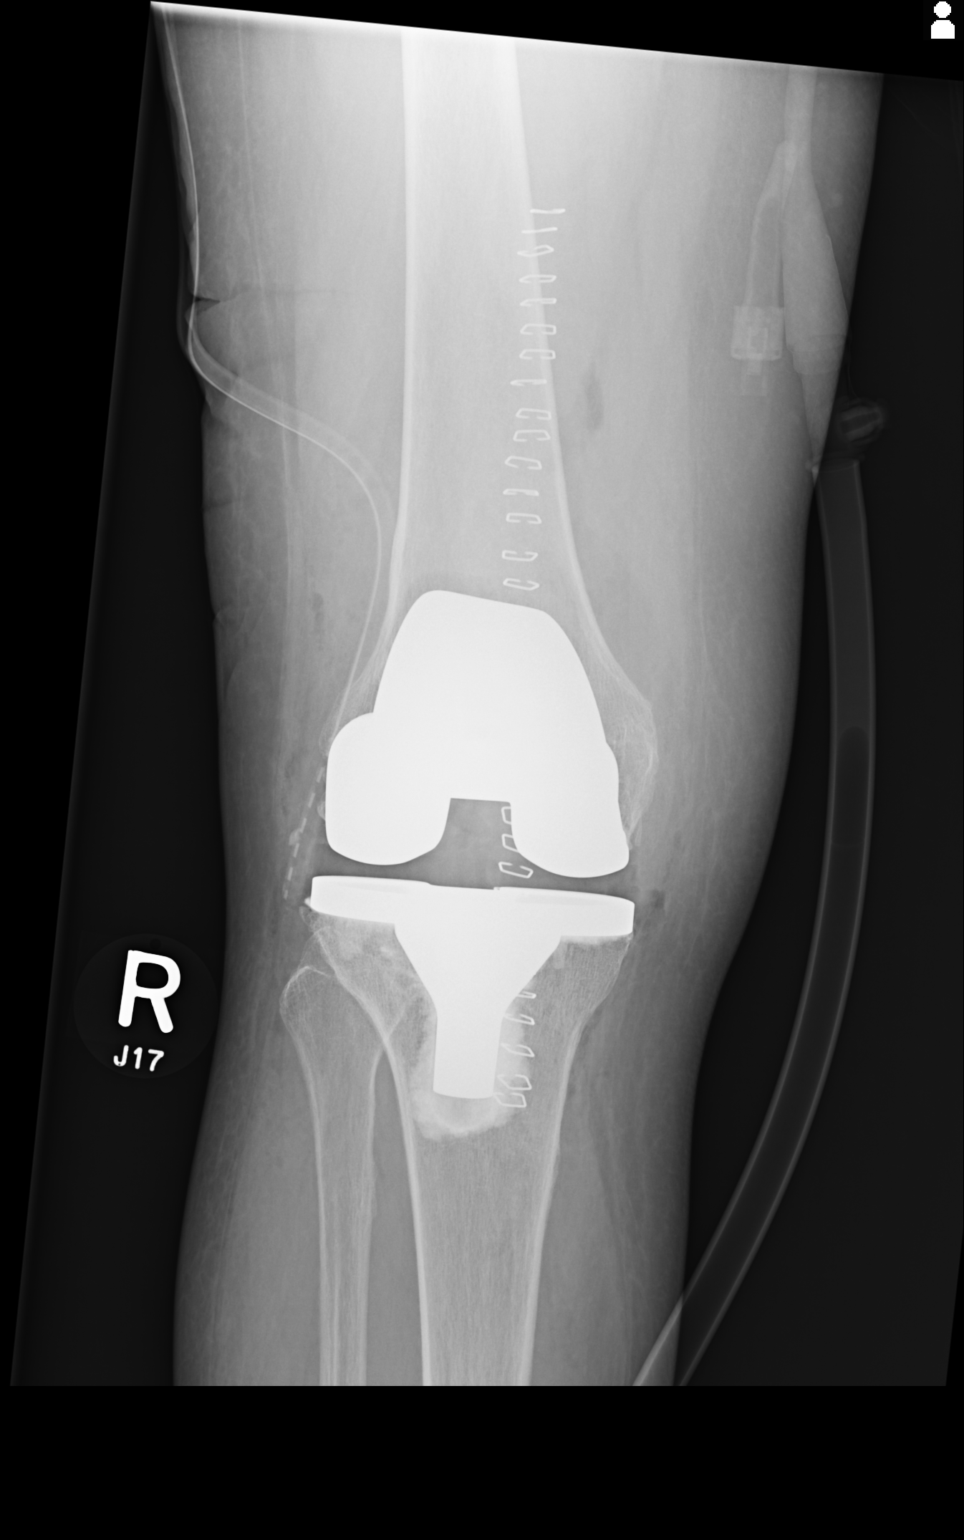

[lat]
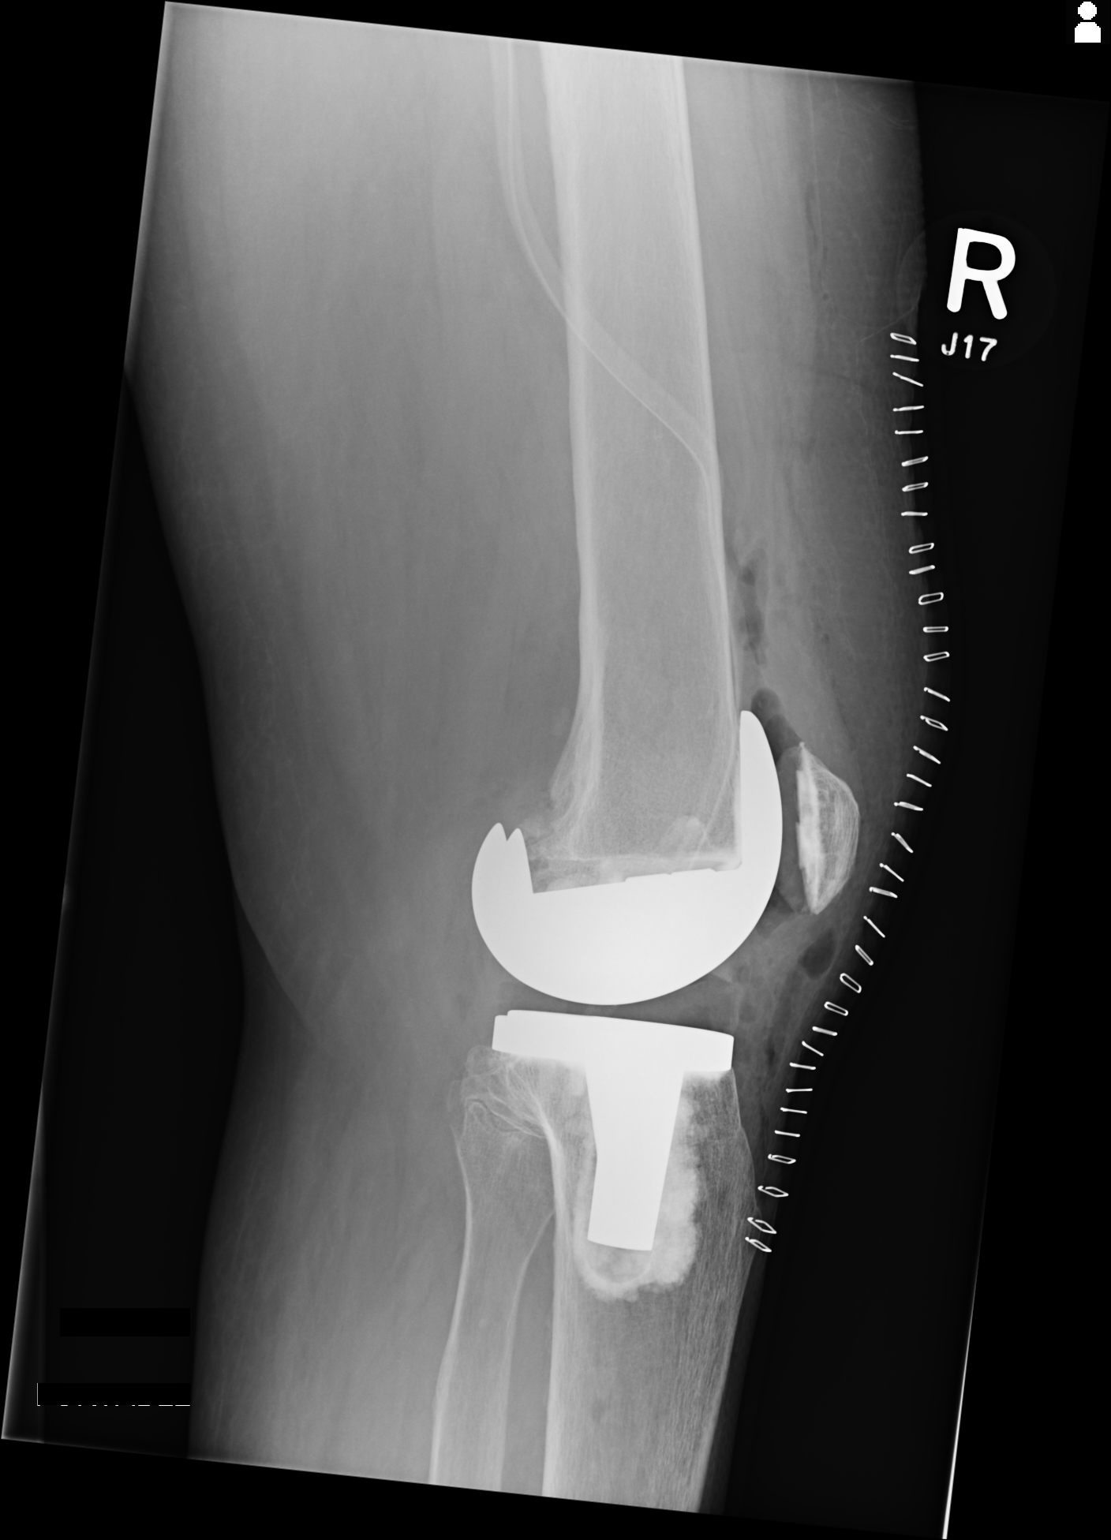

[2 of 2 positions shown; findings below may reference images not displayed]

FINDINGS: Total right knee replacement with good anatomic alignment.
Orthopedic hardware intact.
IMPRESSION: Total right knee replacement with good anatomic alignment.

## 2017-01-04 IMAGING — CR DG CHEST 1V
1 series · 1 of 1 positions shown · non-contrast
Comparison: PA and lateral chest x-ray October 22, 2012

CLINICAL DATA: Three weeks of nonproductive cough, positive PPD

EXAM:
CHEST - 1 VIEW

[view not recorded]
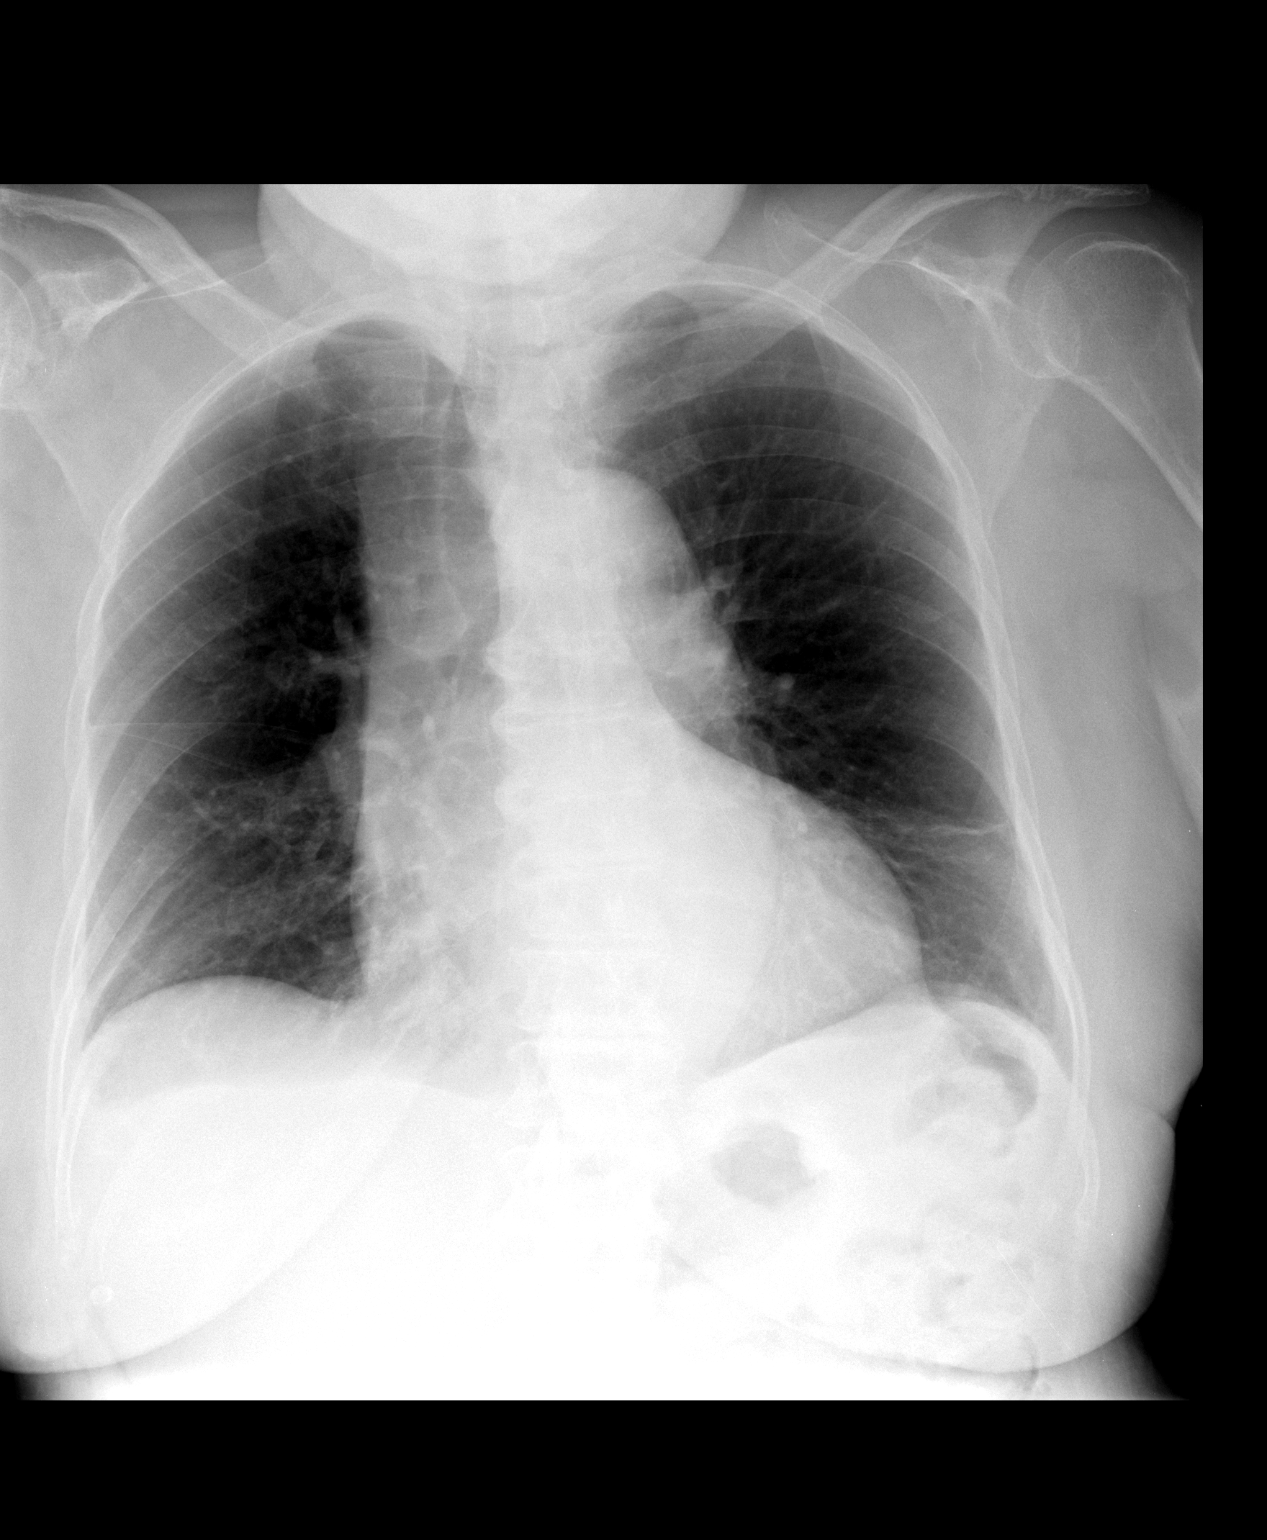

[1 of 1 positions shown; findings below may reference images not displayed]

FINDINGS: The lungs are adequately inflated. There is stable scarring lateral
to the left heart border. There is no pleural effusion or alveolar
infiltrate. The cardiac silhouette is mildly enlarged. The pulmonary
vascularity is normal. There is stable tortuosity of the descending
thoracic aorta. There are prominent right lateral endplate
osteophytes involving the mid thoracic spine.
IMPRESSION: Mild hyperinflation is consistent with COPD. There is no evidence of
acute or old tuberculous infection. There is mild enlargement of the
cardiac silhouette without pulmonary vascular congestion.

## 2017-02-18 DIAGNOSIS — I2510 Atherosclerotic heart disease of native coronary artery without angina pectoris: Secondary | ICD-10-CM | POA: Diagnosis not present

## 2017-02-18 DIAGNOSIS — N390 Urinary tract infection, site not specified: Secondary | ICD-10-CM | POA: Diagnosis not present

## 2017-02-18 DIAGNOSIS — J069 Acute upper respiratory infection, unspecified: Secondary | ICD-10-CM | POA: Diagnosis not present

## 2017-02-18 DIAGNOSIS — I10 Essential (primary) hypertension: Secondary | ICD-10-CM | POA: Diagnosis not present

## 2017-03-22 DIAGNOSIS — E7849 Other hyperlipidemia: Secondary | ICD-10-CM | POA: Diagnosis not present

## 2017-03-22 DIAGNOSIS — R197 Diarrhea, unspecified: Secondary | ICD-10-CM | POA: Diagnosis not present

## 2017-03-22 DIAGNOSIS — R42 Dizziness and giddiness: Secondary | ICD-10-CM | POA: Diagnosis not present

## 2017-03-22 DIAGNOSIS — I10 Essential (primary) hypertension: Secondary | ICD-10-CM | POA: Diagnosis not present

## 2017-04-05 ENCOUNTER — Other Ambulatory Visit (HOSPITAL_COMMUNITY): Payer: Self-pay | Admitting: Internal Medicine

## 2017-04-05 DIAGNOSIS — R350 Frequency of micturition: Secondary | ICD-10-CM

## 2017-04-05 DIAGNOSIS — I2510 Atherosclerotic heart disease of native coronary artery without angina pectoris: Secondary | ICD-10-CM | POA: Diagnosis not present

## 2017-04-05 DIAGNOSIS — I10 Essential (primary) hypertension: Secondary | ICD-10-CM | POA: Diagnosis not present

## 2017-04-05 DIAGNOSIS — N390 Urinary tract infection, site not specified: Secondary | ICD-10-CM | POA: Diagnosis not present

## 2017-04-05 DIAGNOSIS — R52 Pain, unspecified: Secondary | ICD-10-CM

## 2017-04-05 DIAGNOSIS — E7849 Other hyperlipidemia: Secondary | ICD-10-CM | POA: Diagnosis not present

## 2017-04-08 ENCOUNTER — Ambulatory Visit (HOSPITAL_COMMUNITY)
Admission: RE | Admit: 2017-04-08 | Discharge: 2017-04-08 | Disposition: A | Discharge status: Home / Self Care | Payer: Medicare Other | Source: Ambulatory Visit | Attending: Internal Medicine | Admitting: Internal Medicine

## 2017-04-08 DIAGNOSIS — R109 Unspecified abdominal pain: Secondary | ICD-10-CM | POA: Diagnosis not present

## 2017-04-08 DIAGNOSIS — R52 Pain, unspecified: Secondary | ICD-10-CM

## 2017-04-08 DIAGNOSIS — N281 Cyst of kidney, acquired: Secondary | ICD-10-CM | POA: Insufficient documentation

## 2017-04-08 DIAGNOSIS — Z880 Allergy status to penicillin: Secondary | ICD-10-CM | POA: Insufficient documentation

## 2017-04-08 DIAGNOSIS — R350 Frequency of micturition: Secondary | ICD-10-CM | POA: Insufficient documentation

## 2017-05-17 DIAGNOSIS — I2510 Atherosclerotic heart disease of native coronary artery without angina pectoris: Secondary | ICD-10-CM | POA: Diagnosis not present

## 2017-05-17 DIAGNOSIS — M810 Age-related osteoporosis without current pathological fracture: Secondary | ICD-10-CM | POA: Diagnosis not present

## 2017-05-17 DIAGNOSIS — I10 Essential (primary) hypertension: Secondary | ICD-10-CM | POA: Diagnosis not present

## 2017-05-17 DIAGNOSIS — E7849 Other hyperlipidemia: Secondary | ICD-10-CM | POA: Diagnosis not present

## 2017-06-08 DIAGNOSIS — N390 Urinary tract infection, site not specified: Secondary | ICD-10-CM | POA: Diagnosis not present

## 2017-06-08 DIAGNOSIS — I10 Essential (primary) hypertension: Secondary | ICD-10-CM | POA: Diagnosis not present

## 2017-06-08 DIAGNOSIS — R300 Dysuria: Secondary | ICD-10-CM | POA: Diagnosis not present

## 2017-08-23 DIAGNOSIS — K219 Gastro-esophageal reflux disease without esophagitis: Secondary | ICD-10-CM | POA: Diagnosis not present

## 2017-08-23 DIAGNOSIS — Z1389 Encounter for screening for other disorder: Secondary | ICD-10-CM | POA: Diagnosis not present

## 2017-08-23 DIAGNOSIS — Z1331 Encounter for screening for depression: Secondary | ICD-10-CM | POA: Diagnosis not present

## 2017-08-23 DIAGNOSIS — Z0001 Encounter for general adult medical examination with abnormal findings: Secondary | ICD-10-CM | POA: Diagnosis not present

## 2017-08-23 DIAGNOSIS — E7849 Other hyperlipidemia: Secondary | ICD-10-CM | POA: Diagnosis not present

## 2017-08-23 DIAGNOSIS — I2510 Atherosclerotic heart disease of native coronary artery without angina pectoris: Secondary | ICD-10-CM | POA: Diagnosis not present

## 2017-08-24 DIAGNOSIS — K219 Gastro-esophageal reflux disease without esophagitis: Secondary | ICD-10-CM | POA: Diagnosis not present

## 2017-08-24 DIAGNOSIS — B079 Viral wart, unspecified: Secondary | ICD-10-CM | POA: Diagnosis not present

## 2017-08-24 DIAGNOSIS — I2510 Atherosclerotic heart disease of native coronary artery without angina pectoris: Secondary | ICD-10-CM | POA: Diagnosis not present

## 2017-08-24 DIAGNOSIS — I10 Essential (primary) hypertension: Secondary | ICD-10-CM | POA: Diagnosis not present

## 2017-08-24 DIAGNOSIS — E7849 Other hyperlipidemia: Secondary | ICD-10-CM | POA: Diagnosis not present

## 2017-08-24 DIAGNOSIS — R071 Chest pain on breathing: Secondary | ICD-10-CM | POA: Diagnosis not present

## 2017-08-24 DIAGNOSIS — M810 Age-related osteoporosis without current pathological fracture: Secondary | ICD-10-CM | POA: Diagnosis not present

## 2017-08-24 DIAGNOSIS — C189 Malignant neoplasm of colon, unspecified: Secondary | ICD-10-CM | POA: Diagnosis not present

## 2017-09-14 ENCOUNTER — Encounter (HOSPITAL_COMMUNITY): Payer: Self-pay | Admitting: Emergency Medicine

## 2017-09-14 ENCOUNTER — Emergency Department (HOSPITAL_COMMUNITY): Payer: Medicare Other

## 2017-09-14 ENCOUNTER — Other Ambulatory Visit: Payer: Self-pay

## 2017-09-14 ENCOUNTER — Inpatient Hospital Stay (HOSPITAL_COMMUNITY)
Admission: EM | Admit: 2017-09-14 | Discharge: 2017-09-16 | DRG: 390 | Disposition: A | Discharge status: Home / Self Care | Payer: Medicare Other | Attending: Family Medicine | Admitting: Family Medicine

## 2017-09-14 DIAGNOSIS — Z96651 Presence of right artificial knee joint: Secondary | ICD-10-CM | POA: Diagnosis present

## 2017-09-14 DIAGNOSIS — I10 Essential (primary) hypertension: Secondary | ICD-10-CM | POA: Diagnosis present

## 2017-09-14 DIAGNOSIS — Z66 Do not resuscitate: Secondary | ICD-10-CM | POA: Diagnosis present

## 2017-09-14 DIAGNOSIS — Z79899 Other long term (current) drug therapy: Secondary | ICD-10-CM

## 2017-09-14 DIAGNOSIS — M810 Age-related osteoporosis without current pathological fracture: Secondary | ICD-10-CM | POA: Diagnosis present

## 2017-09-14 DIAGNOSIS — Z7983 Long term (current) use of bisphosphonates: Secondary | ICD-10-CM

## 2017-09-14 DIAGNOSIS — Z9049 Acquired absence of other specified parts of digestive tract: Secondary | ICD-10-CM

## 2017-09-14 DIAGNOSIS — K56609 Unspecified intestinal obstruction, unspecified as to partial versus complete obstruction: Secondary | ICD-10-CM | POA: Diagnosis not present

## 2017-09-14 DIAGNOSIS — Z7982 Long term (current) use of aspirin: Secondary | ICD-10-CM

## 2017-09-14 DIAGNOSIS — Z85038 Personal history of other malignant neoplasm of large intestine: Secondary | ICD-10-CM | POA: Diagnosis not present

## 2017-09-14 DIAGNOSIS — Z886 Allergy status to analgesic agent status: Secondary | ICD-10-CM | POA: Diagnosis not present

## 2017-09-14 DIAGNOSIS — R112 Nausea with vomiting, unspecified: Secondary | ICD-10-CM

## 2017-09-14 DIAGNOSIS — R109 Unspecified abdominal pain: Secondary | ICD-10-CM | POA: Diagnosis not present

## 2017-09-14 DIAGNOSIS — E785 Hyperlipidemia, unspecified: Secondary | ICD-10-CM | POA: Diagnosis present

## 2017-09-14 DIAGNOSIS — K5730 Diverticulosis of large intestine without perforation or abscess without bleeding: Secondary | ICD-10-CM | POA: Diagnosis not present

## 2017-09-14 DIAGNOSIS — K56600 Partial intestinal obstruction, unspecified as to cause: Secondary | ICD-10-CM | POA: Diagnosis not present

## 2017-09-14 DIAGNOSIS — K56699 Other intestinal obstruction unspecified as to partial versus complete obstruction: Secondary | ICD-10-CM | POA: Diagnosis not present

## 2017-09-14 DIAGNOSIS — M1990 Unspecified osteoarthritis, unspecified site: Secondary | ICD-10-CM | POA: Diagnosis present

## 2017-09-14 HISTORY — DX: Unspecified intestinal obstruction, unspecified as to partial versus complete obstruction: K56.609

## 2017-09-14 LAB — COMPREHENSIVE METABOLIC PANEL
ALT: 18 U/L (ref 0–44)
ANION GAP: 7 (ref 5–15)
AST: 25 U/L (ref 15–41)
Albumin: 3.9 g/dL (ref 3.5–5.0)
Alkaline Phosphatase: 59 U/L (ref 38–126)
BILIRUBIN TOTAL: 0.5 mg/dL (ref 0.3–1.2)
BUN: 15 mg/dL (ref 8–23)
CHLORIDE: 110 mmol/L (ref 98–111)
CO2: 27 mmol/L (ref 22–32)
Calcium: 8.9 mg/dL (ref 8.9–10.3)
Creatinine, Ser: 0.76 mg/dL (ref 0.44–1.00)
GFR calc Af Amer: >60 mL/min (ref >60)
Glucose, Bld: 119 mg/dL — ABNORMAL HIGH (ref 70–99)
POTASSIUM: 3.6 mmol/L (ref 3.5–5.1)
Sodium: 144 mmol/L (ref 135–145)
TOTAL PROTEIN: 7.1 g/dL (ref 6.5–8.1)

## 2017-09-14 LAB — URINALYSIS, ROUTINE W REFLEX MICROSCOPIC
BILIRUBIN URINE: NEGATIVE
GLUCOSE, UA: NEGATIVE mg/dL
KETONES UR: NEGATIVE mg/dL
LEUKOCYTES UA: NEGATIVE
NITRITE: NEGATIVE
Protein, ur: >=300 mg/dL — AB
Specific Gravity, Urine: 1.022 (ref 1.005–1.030)
pH: 7 (ref 5.0–8.0)

## 2017-09-14 LAB — DIFFERENTIAL
BASOS PCT: 0 %
Basophils Absolute: 0 10*3/uL (ref 0.0–0.1)
EOS PCT: 0 %
Eosinophils Absolute: 0 10*3/uL (ref 0.0–0.7)
Lymphocytes Relative: 17 %
Lymphs Abs: 1.1 10*3/uL (ref 0.7–4.0)
MONO ABS: 0.5 10*3/uL (ref 0.1–1.0)
MONOS PCT: 8 %
NEUTROS ABS: 5.2 10*3/uL (ref 1.7–7.7)
Neutrophils Relative %: 75 %

## 2017-09-14 LAB — CBC
HCT: 40.6 % (ref 36.0–46.0)
HEMOGLOBIN: 13.2 g/dL (ref 12.0–15.0)
MCH: 29.1 pg (ref 26.0–34.0)
MCHC: 32.5 g/dL (ref 30.0–36.0)
MCV: 89.6 fL (ref 78.0–100.0)
Platelets: 231 10*3/uL (ref 150–400)
RBC: 4.53 MIL/uL (ref 3.87–5.11)
RDW: 14.9 % (ref 11.5–15.5)
WBC: 6.8 10*3/uL (ref 4.0–10.5)

## 2017-09-14 LAB — TROPONIN I: Troponin I: <0.03 ng/mL (ref <0.03)

## 2017-09-14 LAB — LIPASE, BLOOD: LIPASE: 29 U/L (ref 11–51)

## 2017-09-14 MED ORDER — FAMOTIDINE IN NACL 20-0.9 MG/50ML-% IV SOLN
20.0000 mg | Freq: Once | INTRAVENOUS | Status: AC
  Administered 2017-09-14: 20 mg via INTRAVENOUS
  Filled 2017-09-14: qty 50

## 2017-09-14 MED ORDER — HYDRALAZINE HCL 20 MG/ML IJ SOLN: 10.0000 mg | Freq: Four times a day (QID) | INTRAMUSCULAR | Status: DC | PRN

## 2017-09-14 MED ORDER — MORPHINE SULFATE (PF) 2 MG/ML IV SOLN
2.0000 mg | INTRAVENOUS | Status: DC | PRN
  Administered 2017-09-14: 2 mg via INTRAVENOUS
  Filled 2017-09-14: qty 1

## 2017-09-14 MED ORDER — ONDANSETRON HCL 4 MG PO TABS: 4.0000 mg | ORAL_TABLET | Freq: Four times a day (QID) | ORAL | Status: DC | PRN

## 2017-09-14 MED ORDER — SODIUM CHLORIDE 0.9 % IV SOLN
INTRAVENOUS | Status: DC
  Administered 2017-09-14 (×2): via INTRAVENOUS

## 2017-09-14 MED ORDER — ONDANSETRON HCL 4 MG/2ML IJ SOLN: 4.0000 mg | Freq: Four times a day (QID) | INTRAMUSCULAR | Status: DC | PRN

## 2017-09-14 MED ORDER — POTASSIUM CHLORIDE IN NACL 40-0.9 MEQ/L-% IV SOLN
INTRAVENOUS | Status: DC
  Administered 2017-09-14 – 2017-09-15 (×3): 75 mL/h via INTRAVENOUS

## 2017-09-14 MED ORDER — ONDANSETRON HCL 4 MG/2ML IJ SOLN
4.0000 mg | INTRAMUSCULAR | Status: DC | PRN
  Administered 2017-09-14: 4 mg via INTRAVENOUS
  Filled 2017-09-14: qty 2

## 2017-09-14 MED ORDER — IOPAMIDOL (ISOVUE-300) INJECTION 61%
100.0000 mL | Freq: Once | INTRAVENOUS | Status: AC | PRN
  Administered 2017-09-14: 100 mL via INTRAVENOUS

## 2017-09-14 MED ORDER — MORPHINE SULFATE (PF) 2 MG/ML IV SOLN: 2.0000 mg | INTRAVENOUS | Status: DC | PRN

## 2017-09-14 MED ORDER — ACETAMINOPHEN 650 MG RE SUPP: 650.0000 mg | Freq: Four times a day (QID) | RECTAL | Status: DC | PRN

## 2017-09-14 MED ORDER — ENOXAPARIN SODIUM 40 MG/0.4ML ~~LOC~~ SOLN
40.0000 mg | SUBCUTANEOUS | Status: DC
  Filled 2017-09-14: qty 0.4

## 2017-09-14 MED ORDER — ACETAMINOPHEN 325 MG PO TABS: 650.0000 mg | ORAL_TABLET | Freq: Four times a day (QID) | ORAL | Status: DC | PRN

## 2017-09-14 NOTE — ED Triage Notes (Signed)
N/v and abd pain generalized since last night.

## 2017-09-14 NOTE — ED Notes (Signed)
Pt to CT and xray  

## 2017-09-14 NOTE — ED Notes (Signed)
Patient transported to X-ray 

## 2017-09-14 NOTE — ED Notes (Signed)
Notified MD of pt's vomiting and pain

## 2017-09-14 NOTE — ED Provider Notes (Signed)
[facility] Provider Note   CSN: [id number] Arrival date & time: 09/14/17  1020     History   Chief Complaint Chief Complaint  Patient presents with  . Abdominal Pain    HPI Sandra Williams is a 84 y.o. female.  HPI Pt was seen at 1105.  Per pt, c/o gradual onset and persistence of constant generalized abd "pain" since yesterday evening.  Has been associated with multiple intermittent episodes of N/V.  Describes the abd pain as "cramping." Pt states she ate fast food hotdog approximately 1900, and "felt sick" approximately 1-2 hours later. Others that ate with her do not have symptoms.  Denies diarrhea, no fevers, no back pain, no rash, no CP/SOB, no black or blood in stools or emesis.      Past Medical History:  Diagnosis Date  . Arthritis   . Cancer ([place])    cecal carcinoma  . Colon cancer ([place]) 02/01/2012   Stage I (T2, N0, M0) cancer the cecum status post resection by Dr. Arnoldo Morale with 14 lymph nodes found all of which were negative. She had no evidence for metastatic disease on CT scan of the abdomen and pelvis either. Her date of surgery was 10/22/2011.  Not in need of chemotherapy for her stage I cancer.   Marland Kitchen GERD (gastroesophageal reflux disease)   . HTN (hypertension)   . Hyperlipidemia   . Iron deficiency anemia 02/01/2012   At time of colon cancer presentation  . Knee joint pain 2014   right  . Osteoporosis     Patient Active Problem List   Diagnosis Date Noted  . Muscle spasm 02/12/2014  . Arthritis of knee, right 01/30/2014  . Osteoarthritis   . Rotator cuff tear arthropathy 12/11/2013  . Sciatica 10/03/2013  . Primary osteoarthritis of right knee 10/03/2013  . GERD (gastroesophageal reflux disease) 10/23/2012  . Chest pain 10/22/2012  . OA (osteoarthritis) of knee 06/27/2012  . Colon cancer ([place]) 02/01/2012  . Iron deficiency anemia 02/01/2012  . MEMORY LOSS 08/15/2008  . KNEE PAIN, RIGHT 08/12/2008  . CONSTIPATION 03/01/2008    . Spinal stenosis of lumbar region 03/01/2008  . HYPERTENSION, BENIGN ESSENTIAL 01/19/2008  . SHOULDER PAIN, RIGHT 12/12/2007  . POSTMENOPAUSAL OSTEOPOROSIS 12/12/2006  . HYPERLIPIDEMIA 10/07/2006  . OVERACTIVE BLADDER 10/07/2006  . ARTHRITIS 10/07/2006    Past Surgical History:  Procedure Laterality Date  . ABDOMINAL HYSTERECTOMY     partial  . APPENDECTOMY     [facility]  . CATARACT EXTRACTION Bilateral   . CESAREAN SECTION    . COLON SURGERY    . dental implant    . PARTIAL COLECTOMY  10/22/2011   Procedure: PARTIAL COLECTOMY;  Surgeon: Jamesetta So, MD;  Location: [facility];  Service: General;  Laterality: N/A;  . TOTAL KNEE ARTHROPLASTY Right 01/30/2014   Procedure: TOTAL KNEE ARTHROPLASTY;  Surgeon: Carole Civil, MD;  Location: [facility];  Service: Orthopedics;  Laterality: Right;     OB History   None      Home Medications    Prior to Admission medications   Medication Sig Start Date End Date Taking? Authorizing Provider  aspirin 81 MG tablet Take 81 mg by mouth daily.    [provider]  losartan (COZAAR) 100 MG tablet Take 100 mg by mouth daily.    [provider]  pravastatin (PRAVACHOL) 40 MG tablet Take 40 mg by mouth every morning.     [provider]    Family History Family  History  Problem Relation Age of Onset  . Hypertension Mother   . Cancer Sister   . Breast cancer Daughter   . Hypertension Daughter   . Colon cancer Neg Hx   . Liver disease Neg Hx     Social History Social History   Tobacco Use  . Smoking status: Never Smoker  . Smokeless tobacco: Never Used  Substance Use Topics  . Alcohol use: No  . Drug use: No     Allergies   Aspirin   Review of Systems Review of Systems ROS: Statement: All systems negative except as marked or noted in the HPI; Constitutional: Negative for fever and chills. ; ; Eyes: Negative for eye pain, redness and discharge. ; ; ENMT: Negative for ear pain, hoarseness, nasal  congestion, sinus pressure and sore throat. ; ; Cardiovascular: Negative for chest pain, palpitations, diaphoresis, dyspnea and peripheral edema. ; ; Respiratory: Negative for cough, wheezing and stridor. ; ; Gastrointestinal: +N/V, abd pain. Negative for diarrhea, blood in stool, hematemesis, jaundice and rectal bleeding. . ; ; Genitourinary: Negative for dysuria, flank pain and hematuria. ; ; Musculoskeletal: Negative for back pain and neck pain. Negative for swelling and trauma.; ; Skin: Negative for pruritus, rash, abrasions, blisters, bruising and skin lesion.; ; Neuro: Negative for headache, lightheadedness and neck stiffness. Negative for weakness, altered level of consciousness, altered mental status, extremity weakness, paresthesias, involuntary movement, seizure and syncope.       Physical Exam Updated Vital Signs BP (!) 141/64 (BP Location: Right Arm)   Pulse 76   Temp 98.5 F (36.9 C) (Oral)   Resp 20   Ht 5\' 4"  (1.626 m)   Wt 62.6 kg   SpO2 98%   BMI 23.69 kg/m   Physical Exam 1110: Physical examination:  Nursing notes reviewed; Vital signs and O2 SAT reviewed;  Constitutional: Well developed, Well nourished, Well hydrated, In no acute distress; Head:  Normocephalic, atraumatic; Eyes: EOMI, PERRL, No scleral icterus; ENMT: Mouth and pharynx normal, Mucous membranes moist; Neck: Supple, Full range of motion, No lymphadenopathy; Cardiovascular: Regular rate and rhythm, No gallop; Respiratory: Breath sounds clear & equal bilaterally, No wheezes.  Speaking full sentences with ease, Normal respiratory effort/excursion; Chest: Nontender, Movement normal; Abdomen: Soft, +generalized tenderness to palp. No rebound or guarding. Nondistended, Normal bowel sounds; Genitourinary: No CVA tenderness; Extremities: Peripheral pulses normal, No tenderness, No edema, No calf edema or asymmetry.; Neuro: AA&Ox3, Major CN grossly intact.  Speech clear. No gross focal motor or sensory deficits in  extremities.; Skin: Color normal, Warm, Dry.   ED Treatments / Results  Labs (all labs ordered are listed, but only abnormal results are displayed)   EKG EKG Interpretation  Date/Time:  Wednesday September 14 2017 11:24:57 EDT Ventricular Rate:  61 PR Interval:    QRS Duration: 84 QT Interval:  423 QTC Calculation: 427 R Axis:   36 Text Interpretation:  Sinus rhythm Low voltage, precordial leads Nonspecific repol abnormality, inferior leads Baseline wander When compared with ECG of 01/24/2014 No significant change was found Confirmed by Francine Graven [phone]) on 09/14/2017 11:29:25 AM   Radiology   Procedures Procedures (including critical care time)  Medications Ordered in ED Medications - No data to display   Initial Impression / Assessment and Plan / ED Course  I have reviewed the triage vital signs and the nursing notes.  Pertinent labs & imaging results that were available during my care of the patient were reviewed by me and considered in my medical  decision making (see chart for details).  MDM Reviewed: previous chart, nursing note and vitals Reviewed previous: labs and ECG Interpretation: labs, ECG, x-ray and CT scan    Results for orders placed or performed during the hospital encounter of 09/14/17  Lipase, blood  Result Value Ref Range   Lipase 29 11 - 51 U/L  Comprehensive metabolic panel  Result Value Ref Range   Sodium 144 135 - 145 mmol/L   Potassium 3.6 3.5 - 5.1 mmol/L   Chloride 110 98 - 111 mmol/L   CO2 27 22 - 32 mmol/L   Glucose, Bld 119 (H) 70 - 99 mg/dL   BUN 15 8 - 23 mg/dL   Creatinine, Ser 0.76 0.44 - 1.00 mg/dL   Calcium 8.9 8.9 - 10.3 mg/dL   Total Protein 7.1 6.5 - 8.1 g/dL   Albumin 3.9 3.5 - 5.0 g/dL   AST 25 15 - 41 U/L   ALT 18 0 - 44 U/L   Alkaline Phosphatase 59 38 - 126 U/L   Total Bilirubin 0.5 0.3 - 1.2 mg/dL   GFR calc non Af Amer >60 >60 mL/min   GFR calc Af Amer >60 >60 mL/min   Anion gap 7 5 - 15  CBC  Result  Value Ref Range   WBC 6.8 4.0 - 10.5 K/uL   RBC 4.53 3.87 - 5.11 MIL/uL   Hemoglobin 13.2 12.0 - 15.0 g/dL   HCT 40.6 36.0 - 46.0 %   MCV 89.6 78.0 - 100.0 fL   MCH 29.1 26.0 - 34.0 pg   MCHC 32.5 30.0 - 36.0 g/dL   RDW 14.9 11.5 - 15.5 %   Platelets 231 150 - 400 K/uL  Urinalysis, Routine w reflex microscopic  Result Value Ref Range   Color, Urine YELLOW YELLOW   APPearance HAZY (A) CLEAR   Specific Gravity, Urine 1.022 1.005 - 1.030   pH 7.0 5.0 - 8.0   Glucose, UA NEGATIVE NEGATIVE mg/dL   Hgb urine dipstick SMALL (A) NEGATIVE   Bilirubin Urine NEGATIVE NEGATIVE   Ketones, ur NEGATIVE NEGATIVE mg/dL   Protein, ur >=300 (A) NEGATIVE mg/dL   Nitrite NEGATIVE NEGATIVE   Leukocytes, UA NEGATIVE NEGATIVE   RBC / HPF 11-20 0 - 5 RBC/hpf   WBC, UA 0-5 0 - 5 WBC/hpf   Bacteria, UA RARE (A) NONE SEEN   Squamous Epithelial / LPF 6-10 0 - 5   Mucus PRESENT    Hyaline Casts, UA PRESENT   Troponin I  Result Value Ref Range   Troponin I <0.03 <0.03 ng/mL  Differential  Result Value Ref Range   Neutrophils Relative % 75 %   Neutro Abs 5.2 1.7 - 7.7 K/uL   Lymphocytes Relative 17 %   Lymphs Abs 1.1 0.7 - 4.0 K/uL   Monocytes Relative 8 %   Monocytes Absolute 0.5 0.1 - 1.0 K/uL   Eosinophils Relative 0 %   Eosinophils Absolute 0.0 0.0 - 0.7 K/uL   Basophils Relative 0 %   Basophils Absolute 0.0 0.0 - 0.1 K/uL   Dg Chest 2 View Result Date: 09/14/2017 CLINICAL DATA:  Nausea, vomiting, abdominal pain EXAM: CHEST - 2 VIEW COMPARISON:  02/04/2014 FINDINGS: Heart is borderline in size. Tortuosity of the thoracic aorta. No confluent airspace opacities or effusions. No acute bony abnormality. IMPRESSION: No active cardiopulmonary disease. Electronically Signed   By: Rolm Baptise M.D.   On: 09/14/2017 11:39   Ct Abdomen Pelvis W Contrast Result Date: 09/14/2017 CLINICAL  DATA:  Generalized abdominal pain with nausea and vomiting since last night. History of colon cancer. EXAM: CT ABDOMEN  AND PELVIS WITH CONTRAST TECHNIQUE: Multidetector CT imaging of the abdomen and pelvis was performed using the standard protocol following bolus administration of intravenous contrast. CONTRAST:  157mL ISOVUE-300 IOPAMIDOL (ISOVUE-300) INJECTION 61% COMPARISON:  Abdominal ultrasound 04/08/2017. Abdominopelvic CT 09/24/2011. FINDINGS: Lower chest: The heart is enlarged. There is atherosclerosis of the aorta and coronary arteries. No significant pleural or pericardial effusion. There is mild scarring at both lung bases. Hepatobiliary: The liver is normal in density without focal abnormality. No evidence of gallstones, gallbladder wall thickening or biliary dilatation. Pancreas: Probable pancreas divisum. No evidence of pancreatic mass, ductal dilatation or surrounding inflammation. Spleen: Normal in size without focal abnormality. Adrenals/Urinary Tract: Both adrenal glands appear normal. There are bilateral renal cysts, measuring up to 2.8 cm in the lower pole of the right kidney and 3.3 cm in the upper pole of the left kidney. No evidence of renal mass, urinary tract calculus or hydronephrosis. The bladder appears normal. Stomach/Bowel: The distal stomach is incompletely distended. The proximal small bowel appears normal. Patient has undergone interval right colectomy with an ileal to transverse colon anastomosis. There is moderate dilatation of the mid to distal small bowel. There is no well-defined transition point, although the distal 15 cm of the small bowel proximal to the anastomosis appears decompressed. The colon is also decompressed. There are diverticular changes throughout the remaining colon. No focal surrounding inflammatory changes. Vascular/Lymphatic: There are no enlarged abdominal or pelvic lymph nodes. There is stable aortic and branch vessel atherosclerosis with tortuosity. Reproductive: Hysterectomy.  No adnexal mass. Other: Trace pelvic ascites. There is no peritoneal nodularity, focal  extraluminal fluid collection or free air. Postsurgical changes in the anterior abdominal wall. Musculoskeletal: No acute or significant osseous findings. There are degenerative changes throughout the spine. There is a grade 1 degenerative anterolisthesis at L4-5 with some associated foraminal narrowing. IMPRESSION: 1. Distal small bowel obstruction. Specific etiology not demonstrated by this examination, although an ill-defined transition point is noted approximately 15 cm proximal to the ileocolonic anastomosis. 2. Colonic diverticulosis without evidence of acute diverticulitis. 3. No evidence of metastatic colon cancer. 4. Bilateral renal cysts.  Aortic Atherosclerosis (ICD10-I70.0). Electronically Signed   By: Richardean Sale M.D.   On: 09/14/2017 13:30    1410:  CT with SBO. Vomited x1 in ED. Pt feels nausea/pain improved after meds and no further vomiting. Dx and testing d/w pt and family.  Questions answered.  Verb understanding, agreeable to admit.  T/C returned from [facility] Dr. Arnoldo Morale, case discussed, including:  HPI, pertinent PM/SHx, VS/PE, dx testing, ED course and treatment:  Agreeable to consult.  1435:  T/C returned from [facility] Dr. Roderic Palau, case discussed, including:  HPI, pertinent PM/SHx, VS/PE, dx testing, ED course and treatment:  Agreeable to admit.     Final Clinical Impressions(s) / ED Diagnoses   Final diagnoses:  None    ED Discharge Orders    None       Francine Graven, DO 09/18/17 1315

## 2017-09-14 NOTE — H&P (Signed)
History and Physical    Hiliana Eilts EGB:[id number] DOB: December 27, 1925 DOA: 09/14/2017  PCP: Rosita Fire, MD  Patient coming from: Home  I have personally briefly reviewed patient's old medical records in [place]  Chief Complaint: Abdominal pain   HPI: Sandra Williams is a 84 y.o. female with medical history significant of HTN, HLD, presented to the hospital with complaints of abdominal pain. Onset of symptoms occurred 1-2 days ago. Pain was sharp and diffuse throughout abdomen. Yesterday she developed significant nausea and vomiting. She has not had a bowel movement or flatus in 2 days. She has not had any fever. She reports onset of symptoms occurring after she ate a hot dog 2 days ago.   ED Course: lab work noted to be unremarkable.  Patient underwent CT abdomen that showed small bowel obstruction.  She received Zofran and morphine for nausea and abdominal pain.  She is feeling better after that.  She is been referred for admission.  Review of Systems: As per HPI otherwise 10 point review of systems negative.    Past Medical History:  Diagnosis Date  . Arthritis   . Cancer ([place])    cecal carcinoma  . Colon cancer ([place]) 02/01/2012   Stage I (T2, N0, M0) cancer the cecum status post resection by Dr. Arnoldo Morale with 14 lymph nodes found all of which were negative. She had no evidence for metastatic disease on CT scan of the abdomen and pelvis either. Her date of surgery was 10/22/2011.  Not in need of chemotherapy for her stage I cancer.   Marland Kitchen GERD (gastroesophageal reflux disease)   . HTN (hypertension)   . Hyperlipidemia   . Iron deficiency anemia 02/01/2012   At time of colon cancer presentation  . Knee joint pain 2014   right  . Osteoporosis     Past Surgical History:  Procedure Laterality Date  . ABDOMINAL HYSTERECTOMY     partial  . APPENDECTOMY     [facility]  . CATARACT EXTRACTION Bilateral   . CESAREAN SECTION    . COLON SURGERY    . dental implant    .  PARTIAL COLECTOMY  10/22/2011   Procedure: PARTIAL COLECTOMY;  Surgeon: Jamesetta So, MD;  Location: [facility];  Service: General;  Laterality: N/A;  . TOTAL KNEE ARTHROPLASTY Right 01/30/2014   Procedure: TOTAL KNEE ARTHROPLASTY;  Surgeon: Carole Civil, MD;  Location: [facility];  Service: Orthopedics;  Laterality: Right;    Social History:  reports that she has never smoked. She has never used smokeless tobacco. She reports that she does not drink alcohol or use drugs.  Allergies  Allergen Reactions  . Aspirin     REACTION: Upset stomach if takes regular and uncoated     Family History  Problem Relation Age of Onset  . Hypertension Mother   . Cancer Sister   . Breast cancer Daughter   . Hypertension Daughter   . Colon cancer Neg Hx   . Liver disease Neg Hx     Prior to Admission medications   Medication Sig Start Date End Date Taking? Authorizing Provider  acetaminophen (TYLENOL) 325 MG tablet Take by mouth every 6 (six) hours as needed.   Yes [provider]  alendronate (FOSAMAX) 70 MG tablet Take 70 mg by mouth once a week. Take with a full glass of water on an empty stomach.   Yes [provider]  amLODipine (NORVASC) 5 MG tablet Take 5 mg by mouth daily. 06/08/17  Yes [provider]  aspirin 81 MG tablet Take 81 mg by mouth daily.   Yes [provider]  losartan (COZAAR) 100 MG tablet Take 100 mg by mouth daily.   Yes [provider]  pravastatin (PRAVACHOL) 40 MG tablet Take 40 mg by mouth every morning.    Yes [provider]    Physical Exam: Vitals:   09/14/17 1545 09/14/17 1600 09/14/17 1656 09/14/17 1656  BP:  (!) 114/52  (!) 153/61  Pulse: 62   64  Resp: 11 14  20   Temp:    98.2 F (36.8 C)  TempSrc:    Oral  SpO2: 100%   98%  Weight:   62.1 kg   Height:   5\' 4"  (1.626 m)     Constitutional: NAD, calm, comfortable Eyes: PERRL, lids and conjunctivae normal ENMT: Mucous membranes are moist. Posterior  pharynx clear of any exudate or lesions.Normal dentition.  Neck: normal, supple, no masses, no thyromegaly Respiratory: clear to auscultation bilaterally, no wheezing, no crackles. Normal respiratory effort. No accessory muscle use.  Cardiovascular: Regular rate and rhythm, no murmurs / rubs / gallops. No extremity edema. 2+ pedal pulses. No carotid bruits.  Abdomen: Mild diffuse tenderness, no masses palpated. No hepatosplenomegaly. Bowel sounds positive.  Musculoskeletal: no clubbing / cyanosis. No joint deformity upper and lower extremities. Good ROM, no contractures. Normal muscle tone.  Skin: no rashes, lesions, ulcers. No induration Neurologic: CN 2-12 grossly intact. Sensation intact, DTR normal. Strength 5/5 in all 4.  Psychiatric: Normal judgment and insight. Alert and oriented x 3. Normal mood.    Labs on Admission: I have personally reviewed following labs and imaging studies  CBC: Recent Labs  Lab 09/14/17 1100  WBC 6.8  NEUTROABS 5.2  HGB 13.2  HCT 40.6  MCV 89.6  PLT 462   Basic Metabolic Panel: Recent Labs  Lab 09/14/17 1100  NA 144  K 3.6  CL 110  CO2 27  GLUCOSE 119*  BUN 15  CREATININE 0.76  CALCIUM 8.9   GFR: Estimated Creatinine Clearance: 40.4 mL/min (by C-G formula based on SCr of 0.76 mg/dL). Liver Function Tests: Recent Labs  Lab 09/14/17 1100  AST 25  ALT 18  ALKPHOS 59  BILITOT 0.5  PROT 7.1  ALBUMIN 3.9   Recent Labs  Lab 09/14/17 1100  LIPASE 29   No results for input(s): AMMONIA in the last 168 hours. Coagulation Profile: No results for input(s): INR, PROTIME in the last 168 hours. Cardiac Enzymes: Recent Labs  Lab 09/14/17 1123  TROPONINI <0.03   BNP (last 3 results) No results for input(s): PROBNP in the last 8760 hours. HbA1C: No results for input(s): HGBA1C in the last 72 hours. CBG: No results for input(s): GLUCAP in the last 168 hours. Lipid Profile: No results for input(s): CHOL, HDL, LDLCALC, TRIG, CHOLHDL,  LDLDIRECT in the last 72 hours. Thyroid Function Tests: No results for input(s): TSH, T4TOTAL, FREET4, T3FREE, THYROIDAB in the last 72 hours. Anemia Panel: No results for input(s): VITAMINB12, FOLATE, FERRITIN, TIBC, IRON, RETICCTPCT in the last 72 hours. Urine analysis:    Component Value Date/Time   COLORURINE YELLOW 09/14/2017 1048   APPEARANCEUR HAZY (A) 09/14/2017 1048   LABSPEC 1.022 09/14/2017 1048   PHURINE 7.0 09/14/2017 1048   GLUCOSEU NEGATIVE 09/14/2017 1048   HGBUR SMALL (A) 09/14/2017 1048   HGBUR negative 03/01/2008 1450   BILIRUBINUR NEGATIVE 09/14/2017 1048   KETONESUR NEGATIVE 09/14/2017 1048   PROTEINUR >=300 (A) 09/14/2017  1048   UROBILINOGEN 0.2 10/06/2014 0800   NITRITE NEGATIVE 09/14/2017 1048   LEUKOCYTESUR NEGATIVE 09/14/2017 1048    Radiological Exams on Admission: Dg Chest 2 View  Result Date: 09/14/2017 CLINICAL DATA:  Nausea, vomiting, abdominal pain EXAM: CHEST - 2 VIEW COMPARISON:  02/04/2014 FINDINGS: Heart is borderline in size. Tortuosity of the thoracic aorta. No confluent airspace opacities or effusions. No acute bony abnormality. IMPRESSION: No active cardiopulmonary disease. Electronically Signed   By: Rolm Baptise M.D.   On: 09/14/2017 11:39   Ct Abdomen Pelvis W Contrast  Result Date: 09/14/2017 CLINICAL DATA:  Generalized abdominal pain with nausea and vomiting since last night. History of colon cancer. EXAM: CT ABDOMEN AND PELVIS WITH CONTRAST TECHNIQUE: Multidetector CT imaging of the abdomen and pelvis was performed using the standard protocol following bolus administration of intravenous contrast. CONTRAST:  141mL ISOVUE-300 IOPAMIDOL (ISOVUE-300) INJECTION 61% COMPARISON:  Abdominal ultrasound 04/08/2017. Abdominopelvic CT 09/24/2011. FINDINGS: Lower chest: The heart is enlarged. There is atherosclerosis of the aorta and coronary arteries. No significant pleural or pericardial effusion. There is mild scarring at both lung bases.  Hepatobiliary: The liver is normal in density without focal abnormality. No evidence of gallstones, gallbladder wall thickening or biliary dilatation. Pancreas: Probable pancreas divisum. No evidence of pancreatic mass, ductal dilatation or surrounding inflammation. Spleen: Normal in size without focal abnormality. Adrenals/Urinary Tract: Both adrenal glands appear normal. There are bilateral renal cysts, measuring up to 2.8 cm in the lower pole of the right kidney and 3.3 cm in the upper pole of the left kidney. No evidence of renal mass, urinary tract calculus or hydronephrosis. The bladder appears normal. Stomach/Bowel: The distal stomach is incompletely distended. The proximal small bowel appears normal. Patient has undergone interval right colectomy with an ileal to transverse colon anastomosis. There is moderate dilatation of the mid to distal small bowel. There is no well-defined transition point, although the distal 15 cm of the small bowel proximal to the anastomosis appears decompressed. The colon is also decompressed. There are diverticular changes throughout the remaining colon. No focal surrounding inflammatory changes. Vascular/Lymphatic: There are no enlarged abdominal or pelvic lymph nodes. There is stable aortic and branch vessel atherosclerosis with tortuosity. Reproductive: Hysterectomy.  No adnexal mass. Other: Trace pelvic ascites. There is no peritoneal nodularity, focal extraluminal fluid collection or free air. Postsurgical changes in the anterior abdominal wall. Musculoskeletal: No acute or significant osseous findings. There are degenerative changes throughout the spine. There is a grade 1 degenerative anterolisthesis at L4-5 with some associated foraminal narrowing. IMPRESSION: 1. Distal small bowel obstruction. Specific etiology not demonstrated by this examination, although an ill-defined transition point is noted approximately 15 cm proximal to the ileocolonic anastomosis. 2. Colonic  diverticulosis without evidence of acute diverticulitis. 3. No evidence of metastatic colon cancer. 4. Bilateral renal cysts.  Aortic Atherosclerosis (ICD10-I70.0). Electronically Signed   By: Richardean Sale M.D.   On: 09/14/2017 13:30    EKG: Independently reviewed.  Sinus rhythm without any acute changes  Assessment/Plan Active Problems:   HLD (hyperlipidemia)   HYPERTENSION, BENIGN ESSENTIAL   Small bowel obstruction ([place])     1. Small bowel obstruction.  Treat supportively with bowel rest, IV fluids, early ambulation.  General surgery consulted.  Keep n.p.o. for now.  If she develops persistent vomiting, may need to consider NG tube.  At this time, she is not having any further vomiting. 2. Hypertension.  She is on losartan as an outpatient.  Will hold for now.  Use IV hydralazine as needed 3. HLD. Will resume statin once able to take PO  DVT prophylaxis: lovenox   Code Status: DNR  Family Communication: no family present  Disposition Plan: discharge home once improved  Consults called: general surgery  Admission status: inpatient, medsurg   Kathie Dike MD [facility] Pager [phone]  If 7PM-7AM, please contact night-coverage [email or web address] Password [email or web address]  09/14/2017, 6:01 PM

## 2017-09-15 LAB — CBC
HEMATOCRIT: 38.7 % (ref 36.0–46.0)
HEMOGLOBIN: 12.2 g/dL (ref 12.0–15.0)
MCH: 28.7 pg (ref 26.0–34.0)
MCHC: 31.5 g/dL (ref 30.0–36.0)
MCV: 91.1 fL (ref 78.0–100.0)
PLATELETS: 238 10*3/uL (ref 150–400)
RBC: 4.25 MIL/uL (ref 3.87–5.11)
RDW: 15.4 % (ref 11.5–15.5)
WBC: 5.9 10*3/uL (ref 4.0–10.5)

## 2017-09-15 LAB — BASIC METABOLIC PANEL
Anion gap: 6 (ref 5–15)
BUN: 12 mg/dL (ref 8–23)
CHLORIDE: 115 mmol/L — AB (ref 98–111)
CO2: 24 mmol/L (ref 22–32)
Calcium: 7.8 mg/dL — ABNORMAL LOW (ref 8.9–10.3)
Creatinine, Ser: 0.63 mg/dL (ref 0.44–1.00)
GFR calc non Af Amer: >60 mL/min (ref >60)
Glucose, Bld: 83 mg/dL (ref 70–99)
POTASSIUM: 4 mmol/L (ref 3.5–5.1)
Sodium: 145 mmol/L (ref 135–145)

## 2017-09-15 MED ORDER — ASPIRIN EC 81 MG PO TBEC
81.0000 mg | DELAYED_RELEASE_TABLET | Freq: Every day | ORAL | Status: DC
  Administered 2017-09-15 – 2017-09-16 (×2): 81 mg via ORAL
  Filled 2017-09-15 (×2): qty 1

## 2017-09-15 MED ORDER — LOSARTAN POTASSIUM 50 MG PO TABS
100.0000 mg | ORAL_TABLET | Freq: Every day | ORAL | Status: DC
  Administered 2017-09-15 – 2017-09-16 (×2): 100 mg via ORAL
  Filled 2017-09-15 (×2): qty 2

## 2017-09-15 MED ORDER — PRAVASTATIN SODIUM 40 MG PO TABS
40.0000 mg | ORAL_TABLET | Freq: Every morning | ORAL | Status: DC
  Administered 2017-09-15 – 2017-09-16 (×2): 40 mg via ORAL
  Filled 2017-09-15 (×2): qty 1

## 2017-09-15 MED ORDER — AMLODIPINE BESYLATE 5 MG PO TABS
5.0000 mg | ORAL_TABLET | Freq: Every day | ORAL | Status: DC
  Administered 2017-09-15 – 2017-09-16 (×2): 5 mg via ORAL
  Filled 2017-09-15 (×2): qty 1

## 2017-09-15 NOTE — Consult Note (Signed)
Reason for Consult: Partial bowel obstruction Referring Physician: Dr. Glean Salen Sandra Williams is an 84 y.o. female.  HPI: Patient is a 84 year old black female status post right hemicolectomy by myself in 2013 for a stage I colon cancer who presents with a 5-day history of nonspecific abdominal pain.  She states that this is the first time she has had this abdominal pain since surgery.  Since her admission, she hashad a bowel movement and passed gas.  CT scan of the abdomen revealed a partial distal small bowel obstruction close to the anastomosis.  No evidence of recurrence of her cancer is noted.  She currently has a pain level of 2 out of 10.  Past Medical History:  Diagnosis Date  . Arthritis   . Cancer ([place])    cecal carcinoma  . Colon cancer ([place]) 02/01/2012   Stage I (T2, N0, M0) cancer the cecum status post resection by Dr. Arnoldo Morale with 14 lymph nodes found all of which were negative. She had no evidence for metastatic disease on CT scan of the abdomen and pelvis either. Her date of surgery was 10/22/2011.  Not in need of chemotherapy for her stage I cancer.   Marland Kitchen GERD (gastroesophageal reflux disease)   . HTN (hypertension)   . Hyperlipidemia   . Iron deficiency anemia 02/01/2012   At time of colon cancer presentation  . Knee joint pain 2014   right  . Osteoporosis     Past Surgical History:  Procedure Laterality Date  . ABDOMINAL HYSTERECTOMY     partial  . APPENDECTOMY     [facility]  . CATARACT EXTRACTION Bilateral   . CESAREAN SECTION    . COLON SURGERY    . dental implant    . PARTIAL COLECTOMY  10/22/2011   Procedure: PARTIAL COLECTOMY;  Surgeon: Jamesetta So, MD;  Location: [facility];  Service: General;  Laterality: N/A;  . TOTAL KNEE ARTHROPLASTY Right 01/30/2014   Procedure: TOTAL KNEE ARTHROPLASTY;  Surgeon: Carole Civil, MD;  Location: [facility];  Service: Orthopedics;  Laterality: Right;    Family History  Problem Relation Age of Onset  . Hypertension Mother    . Cancer Sister   . Breast cancer Daughter   . Hypertension Daughter   . Colon cancer Neg Hx   . Liver disease Neg Hx     Social History:  reports that she has never smoked. She has never used smokeless tobacco. She reports that she does not drink alcohol or use drugs.  Allergies:  Allergies  Allergen Reactions  . Aspirin     REACTION: Upset stomach if takes regular and uncoated     Medications: I have reviewed the patient's current medications.  Results for orders placed or performed during the hospital encounter of 09/14/17 (from the past 48 hour(s))  Urinalysis, Routine w reflex microscopic     Status: Abnormal   Collection Time: 09/14/17 10:48 AM  Result Value Ref Range   Color, Urine YELLOW YELLOW   APPearance HAZY (A) CLEAR   Specific Gravity, Urine 1.022 1.005 - 1.030   pH 7.0 5.0 - 8.0   Glucose, UA NEGATIVE NEGATIVE mg/dL   Hgb urine dipstick SMALL (A) NEGATIVE   Bilirubin Urine NEGATIVE NEGATIVE   Ketones, ur NEGATIVE NEGATIVE mg/dL   Protein, ur >=300 (A) NEGATIVE mg/dL   Nitrite NEGATIVE NEGATIVE   Leukocytes, UA NEGATIVE NEGATIVE   RBC / HPF 11-20 0 - 5 RBC/hpf   WBC, UA 0-5 0 - 5 WBC/hpf  Bacteria, UA RARE (A) NONE SEEN   Squamous Epithelial / LPF 6-10 0 - 5   Mucus PRESENT    Hyaline Casts, UA PRESENT     Comment: Performed at [facility], [address] [place], [place] [address]  Lipase, blood     Status: None   Collection Time: 09/14/17 11:00 AM  Result Value Ref Range   Lipase 29 11 - 51 U/L    Comment: Performed at [facility], [address], [place], [place] [address]  Comprehensive metabolic panel     Status: Abnormal   Collection Time: 09/14/17 11:00 AM  Result Value Ref Range   Sodium 144 135 - 145 mmol/L   Potassium 3.6 3.5 - 5.1 mmol/L   Chloride 110 98 - 111 mmol/L   CO2 27 22 - 32 mmol/L   Glucose, Bld 119 (H) 70 - 99 mg/dL   BUN 15 8 - 23 mg/dL   Creatinine, Ser 0.76 0.44 - 1.00 mg/dL   Calcium 8.9 8.9 - 10.3 mg/dL   Total  Protein 7.1 6.5 - 8.1 g/dL   Albumin 3.9 3.5 - 5.0 g/dL   AST 25 15 - 41 U/L   ALT 18 0 - 44 U/L   Alkaline Phosphatase 59 38 - 126 U/L   Total Bilirubin 0.5 0.3 - 1.2 mg/dL   GFR calc non Af Amer >60 >60 mL/min   GFR calc Af Amer >60 >60 mL/min    Comment: (NOTE) The eGFR has been calculated using the CKD EPI equation. This calculation has not been validated in all clinical situations. eGFR's persistently <60 mL/min signify possible Chronic Kidney Disease.    Anion gap 7 5 - 15    Comment: Performed at [facility], [address] [place], [place] [address]  CBC     Status: None   Collection Time: 09/14/17 11:00 AM  Result Value Ref Range   WBC 6.8 4.0 - 10.5 K/uL   RBC 4.53 3.87 - 5.11 MIL/uL   Hemoglobin 13.2 12.0 - 15.0 g/dL   HCT 40.6 36.0 - 46.0 %   MCV 89.6 78.0 - 100.0 fL   MCH 29.1 26.0 - 34.0 pg   MCHC 32.5 30.0 - 36.0 g/dL   RDW 14.9 11.5 - 15.5 %   Platelets 231 150 - 400 K/uL    Comment: Performed at [facility], [address] [place], [place] [address]  Differential     Status: None   Collection Time: 09/14/17 11:00 AM  Result Value Ref Range   Neutrophils Relative % 75 %   Neutro Abs 5.2 1.7 - 7.7 K/uL   Lymphocytes Relative 17 %   Lymphs Abs 1.1 0.7 - 4.0 K/uL   Monocytes Relative 8 %   Monocytes Absolute 0.5 0.1 - 1.0 K/uL   Eosinophils Relative 0 %   Eosinophils Absolute 0.0 0.0 - 0.7 K/uL   Basophils Relative 0 %   Basophils Absolute 0.0 0.0 - 0.1 K/uL    Comment: Performed at [facility], [address], [place], [place] [address]  Troponin I     Status: None   Collection Time: 09/14/17 11:23 AM  Result Value Ref Range   Troponin I <0.03 <0.03 ng/mL    Comment: Performed at [facility], [address] [place], [place] [address]  Basic metabolic panel     Status: Abnormal   Collection Time: 09/15/17  5:05 AM  Result Value Ref Range   Sodium 145 135 - 145 mmol/L   Potassium  4.0 3.5 - 5.1 mmol/L   Chloride 115 (H) 98 - 111 mmol/L   CO2 24 22 -  32 mmol/L   Glucose, Bld 83 70 - 99 mg/dL   BUN 12 8 - 23 mg/dL   Creatinine, Ser 0.63 0.44 - 1.00 mg/dL   Calcium 7.8 (L) 8.9 - 10.3 mg/dL   GFR calc non Af Amer >60 >60 mL/min   GFR calc Af Amer >60 >60 mL/min    Comment: (NOTE) The eGFR has been calculated using the CKD EPI equation. This calculation has not been validated in all clinical situations. eGFR's persistently <60 mL/min signify possible Chronic Kidney Disease.    Anion gap 6 5 - 15    Comment: Performed at [facility], [address] [place], [place] [address]  CBC     Status: None   Collection Time: 09/15/17  5:05 AM  Result Value Ref Range   WBC 5.9 4.0 - 10.5 K/uL   RBC 4.25 3.87 - 5.11 MIL/uL   Hemoglobin 12.2 12.0 - 15.0 g/dL   HCT 38.7 36.0 - 46.0 %   MCV 91.1 78.0 - 100.0 fL   MCH 28.7 26.0 - 34.0 pg   MCHC 31.5 30.0 - 36.0 g/dL   RDW 15.4 11.5 - 15.5 %   Platelets 238 150 - 400 K/uL    Comment: Performed at [facility], [address] [place], [place] [address]    Dg Chest 2 View  Result Date: 09/14/2017 CLINICAL DATA:  Nausea, vomiting, abdominal pain EXAM: CHEST - 2 VIEW COMPARISON:  02/04/2014 FINDINGS: Heart is borderline in size. Tortuosity of the thoracic aorta. No confluent airspace opacities or effusions. No acute bony abnormality. IMPRESSION: No active cardiopulmonary disease. Electronically Signed   By: Rolm Baptise M.D.   On: 09/14/2017 11:39   Ct Abdomen Pelvis W Contrast  Result Date: 09/14/2017 CLINICAL DATA:  Generalized abdominal pain with nausea and vomiting since last night. History of colon cancer. EXAM: CT ABDOMEN AND PELVIS WITH CONTRAST TECHNIQUE: Multidetector CT imaging of the abdomen and pelvis was performed using the standard protocol following bolus administration of intravenous contrast. CONTRAST:  143m ISOVUE-300 IOPAMIDOL (ISOVUE-300) INJECTION 61% COMPARISON:  Abdominal ultrasound 04/08/2017. Abdominopelvic CT 09/24/2011. FINDINGS: Lower chest: The heart is enlarged. There is  atherosclerosis of the aorta and coronary arteries. No significant pleural or pericardial effusion. There is mild scarring at both lung bases. Hepatobiliary: The liver is normal in density without focal abnormality. No evidence of gallstones, gallbladder wall thickening or biliary dilatation. Pancreas: Probable pancreas divisum. No evidence of pancreatic mass, ductal dilatation or surrounding inflammation. Spleen: Normal in size without focal abnormality. Adrenals/Urinary Tract: Both adrenal glands appear normal. There are bilateral renal cysts, measuring up to 2.8 cm in the lower pole of the right kidney and 3.3 cm in the upper pole of the left kidney. No evidence of renal mass, urinary tract calculus or hydronephrosis. The bladder appears normal. Stomach/Bowel: The distal stomach is incompletely distended. The proximal small bowel appears normal. Patient has undergone interval right colectomy with an ileal to transverse colon anastomosis. There is moderate dilatation of the mid to distal small bowel. There is no well-defined transition point, although the distal 15 cm of the small bowel proximal to the anastomosis appears decompressed. The colon is also decompressed. There are diverticular changes throughout the remaining colon. No focal surrounding inflammatory changes. Vascular/Lymphatic: There are no enlarged abdominal or pelvic lymph nodes. There is stable aortic and branch vessel atherosclerosis with tortuosity. Reproductive:  Hysterectomy.  No adnexal mass. Other: Trace pelvic ascites. There is no peritoneal nodularity, focal extraluminal fluid collection or free air. Postsurgical changes in the anterior abdominal wall. Musculoskeletal: No acute or significant osseous findings. There are degenerative changes throughout the spine. There is a grade 1 degenerative anterolisthesis at L4-5 with some associated foraminal narrowing. IMPRESSION: 1. Distal small bowel obstruction. Specific etiology not demonstrated by  this examination, although an ill-defined transition point is noted approximately 15 cm proximal to the ileocolonic anastomosis. 2. Colonic diverticulosis without evidence of acute diverticulitis. 3. No evidence of metastatic colon cancer. 4. Bilateral renal cysts.  Aortic Atherosclerosis (ICD10-I70.0). Electronically Signed   By: Richardean Sale M.D.   On: 09/14/2017 13:30    ROS:  Pertinent items are noted in HPI.  Blood pressure 119/76, pulse 71, temperature 98.4 F (36.9 C), temperature source Oral, resp. rate 18, height 5' 4"  (1.626 m), weight 62.1 kg, SpO2 97 %. Physical Exam: Pleasant well-developed well-nourished age-appropriate black female no acute distress Head is normocephalic, atraumatic Lungs clear to auscultation with equal breath sounds bilaterally Heart examination reveals regular rate and rhythm without S3, S4, murmurs Abdomen is soft and not significantly distended.  Bowel sounds are present.  No rigidity or tenderness is noted.  CT scan images personally reviewed  Assessment/Plan: Impression: Partial small bowel obstruction most likely secondary to adhesive disease. Plan: As patient has been passing flatus and moving her bowels, will advance to full liquid diet.  No need for acute surgical intervention at this time.  Sandra Williams 09/15/2017, 8:19 AM

## 2017-09-15 NOTE — Progress Notes (Signed)
PROGRESS NOTE    Sandra Williams  KGS:[id number] DOB: May 30, 1925 DOA: 09/14/2017 PCP: Rosita Fire, MD    Brief Narrative:  84 year old female admitted to the hospital with abdominal pain and vomiting.  CT scan confirms small bowel obstruction.  Admitted for supportive treatment.  General surgery following.   Assessment & Plan:   Active Problems:   HLD (hyperlipidemia)   HYPERTENSION, BENIGN ESSENTIAL   Small bowel obstruction ([place])   1. Small bowel obstruction.  Appears to be improving with supportive treatment.  Started on liquid diet.  She is passing gas and overall nausea and vomiting is better.  General surgery following.  Continue supportive management. 2. Hypertension.  Resume losartan and amlodipine 3. Hyperlipidemia.  Resume statin.   DVT prophylaxis: Lovenox Code Status: DNR Family Communication: No family present Disposition Plan: Discharge home once improved   Consultants:   General surgery  Procedures:     Antimicrobials:       Subjective: Feeling better.  No nausea or vomiting.  Abdominal pain is better.  Passing gas.  Objective: Vitals:   09/14/17 1656 09/14/17 1656 09/14/17 2135 09/15/17 0640  BP:  (!) 153/61 (!) 129/51 119/76  Pulse:  64 62 71  Resp:  20 20 18   Temp:  98.2 F (36.8 C) 98.6 F (37 C) 98.4 F (36.9 C)  TempSrc:  Oral Oral Oral  SpO2:  98% 98% 97%  Weight: 62.1 kg     Height: 5\' 4"  (1.626 m)       Intake/Output Summary (Last 24 hours) at 09/15/2017 1328 Last data filed at 09/15/2017 0900 Gross per 24 hour  Intake 1216.05 ml  Output 600 ml  Net 616.05 ml   Filed Weights   09/14/17 1035 09/14/17 1656  Weight: 62.6 kg 62.1 kg    Examination:  General exam: Appears calm and comfortable  Respiratory system: Clear to auscultation. Respiratory effort normal. Cardiovascular system: S1 & S2 heard, RRR. No JVD, murmurs, rubs, gallops or clicks. No pedal edema. Gastrointestinal system: Abdomen is nondistended, soft  and nontender. No organomegaly or masses felt. Normal bowel sounds heard. Central nervous system: Alert and oriented. No focal neurological deficits. Extremities: Symmetric 5 x 5 power. Skin: No rashes, lesions or ulcers Psychiatry: Judgement and insight appear normal. Mood & affect appropriate.     Data Reviewed: I have personally reviewed following labs and imaging studies  CBC: Recent Labs  Lab 09/14/17 1100 09/15/17 0505  WBC 6.8 5.9  NEUTROABS 5.2  --   HGB 13.2 12.2  HCT 40.6 38.7  MCV 89.6 91.1  PLT 231 585   Basic Metabolic Panel: Recent Labs  Lab 09/14/17 1100 09/15/17 0505  NA 144 145  K 3.6 4.0  CL 110 115*  CO2 27 24  GLUCOSE 119* 83  BUN 15 12  CREATININE 0.76 0.63  CALCIUM 8.9 7.8*   GFR: Estimated Creatinine Clearance: 40.4 mL/min (by C-G formula based on SCr of 0.63 mg/dL). Liver Function Tests: Recent Labs  Lab 09/14/17 1100  AST 25  ALT 18  ALKPHOS 59  BILITOT 0.5  PROT 7.1  ALBUMIN 3.9   Recent Labs  Lab 09/14/17 1100  LIPASE 29   No results for input(s): AMMONIA in the last 168 hours. Coagulation Profile: No results for input(s): INR, PROTIME in the last 168 hours. Cardiac Enzymes: Recent Labs  Lab 09/14/17 1123  TROPONINI <0.03   BNP (last 3 results) No results for input(s): PROBNP in the last 8760 hours. HbA1C: No results for input(s):  HGBA1C in the last 72 hours. CBG: No results for input(s): GLUCAP in the last 168 hours. Lipid Profile: No results for input(s): CHOL, HDL, LDLCALC, TRIG, CHOLHDL, LDLDIRECT in the last 72 hours. Thyroid Function Tests: No results for input(s): TSH, T4TOTAL, FREET4, T3FREE, THYROIDAB in the last 72 hours. Anemia Panel: No results for input(s): VITAMINB12, FOLATE, FERRITIN, TIBC, IRON, RETICCTPCT in the last 72 hours. Sepsis Labs: No results for input(s): PROCALCITON, LATICACIDVEN in the last 168 hours.  No results found for this or any previous visit (from the past 240 hour(s)).        Radiology Studies: Dg Chest 2 View  Result Date: 09/14/2017 CLINICAL DATA:  Nausea, vomiting, abdominal pain EXAM: CHEST - 2 VIEW COMPARISON:  02/04/2014 FINDINGS: Heart is borderline in size. Tortuosity of the thoracic aorta. No confluent airspace opacities or effusions. No acute bony abnormality. IMPRESSION: No active cardiopulmonary disease. Electronically Signed   By: Rolm Baptise M.D.   On: 09/14/2017 11:39   Ct Abdomen Pelvis W Contrast  Result Date: 09/14/2017 CLINICAL DATA:  Generalized abdominal pain with nausea and vomiting since last night. History of colon cancer. EXAM: CT ABDOMEN AND PELVIS WITH CONTRAST TECHNIQUE: Multidetector CT imaging of the abdomen and pelvis was performed using the standard protocol following bolus administration of intravenous contrast. CONTRAST:  181mL ISOVUE-300 IOPAMIDOL (ISOVUE-300) INJECTION 61% COMPARISON:  Abdominal ultrasound 04/08/2017. Abdominopelvic CT 09/24/2011. FINDINGS: Lower chest: The heart is enlarged. There is atherosclerosis of the aorta and coronary arteries. No significant pleural or pericardial effusion. There is mild scarring at both lung bases. Hepatobiliary: The liver is normal in density without focal abnormality. No evidence of gallstones, gallbladder wall thickening or biliary dilatation. Pancreas: Probable pancreas divisum. No evidence of pancreatic mass, ductal dilatation or surrounding inflammation. Spleen: Normal in size without focal abnormality. Adrenals/Urinary Tract: Both adrenal glands appear normal. There are bilateral renal cysts, measuring up to 2.8 cm in the lower pole of the right kidney and 3.3 cm in the upper pole of the left kidney. No evidence of renal mass, urinary tract calculus or hydronephrosis. The bladder appears normal. Stomach/Bowel: The distal stomach is incompletely distended. The proximal small bowel appears normal. Patient has undergone interval right colectomy with an ileal to transverse colon  anastomosis. There is moderate dilatation of the mid to distal small bowel. There is no well-defined transition point, although the distal 15 cm of the small bowel proximal to the anastomosis appears decompressed. The colon is also decompressed. There are diverticular changes throughout the remaining colon. No focal surrounding inflammatory changes. Vascular/Lymphatic: There are no enlarged abdominal or pelvic lymph nodes. There is stable aortic and branch vessel atherosclerosis with tortuosity. Reproductive: Hysterectomy.  No adnexal mass. Other: Trace pelvic ascites. There is no peritoneal nodularity, focal extraluminal fluid collection or free air. Postsurgical changes in the anterior abdominal wall. Musculoskeletal: No acute or significant osseous findings. There are degenerative changes throughout the spine. There is a grade 1 degenerative anterolisthesis at L4-5 with some associated foraminal narrowing. IMPRESSION: 1. Distal small bowel obstruction. Specific etiology not demonstrated by this examination, although an ill-defined transition point is noted approximately 15 cm proximal to the ileocolonic anastomosis. 2. Colonic diverticulosis without evidence of acute diverticulitis. 3. No evidence of metastatic colon cancer. 4. Bilateral renal cysts.  Aortic Atherosclerosis (ICD10-I70.0). Electronically Signed   By: Richardean Sale M.D.   On: 09/14/2017 13:30        Scheduled Meds: . enoxaparin (LOVENOX) injection  40 mg Subcutaneous Q24H  Continuous Infusions: . 0.9 % NaCl with KCl 40 mEq / L 75 mL/hr (09/15/17 0910)     LOS: 1 day    Time spent: 30 minutes    Kathie Dike, MD [facility] Pager [phone]  If 7PM-7AM, please contact night-coverage [email or web address] Password [email or web address] 09/15/2017, 1:28 PM

## 2017-09-16 LAB — URINE CULTURE: Culture: NO GROWTH

## 2017-09-16 MED ORDER — DOCUSATE SODIUM 100 MG PO CAPS: 100.0000 mg | ORAL_CAPSULE | Freq: Two times a day (BID) | ORAL | 0 refills | Status: AC

## 2017-09-16 NOTE — Progress Notes (Signed)
Subjective: Patient has had multiple bowel movements.  Tolerating diet well.  Denies any abdominal pain.  Objective: Vital signs in last 24 hours: Temp:  [98.3 F (36.8 C)-98.7 F (37.1 C)] 98.3 F (36.8 C) (09/13 0500) Pulse Rate:  [66-72] 66 (09/13 0500) Resp:  [16-20] 20 (09/13 0500) BP: (118-148)/(58-96) 133/58 (09/13 0500) SpO2:  [96 %-99 %] 98 % (09/13 0500) Last BM Date: 09/15/17  Intake/Output from previous day: 09/12 0701 - 09/13 0700 In: 2239.3 [P.O.:840; I.V.:1399.3] Out: 300 [Urine:300] Intake/Output this shift: No intake/output data recorded.  General appearance: alert, cooperative and no distress GI: soft, non-tender; bowel sounds normal; no masses,  no organomegaly  Lab Results:  Recent Labs    09/14/17 1100 09/15/17 0505  WBC 6.8 5.9  HGB 13.2 12.2  HCT 40.6 38.7  PLT 231 238   BMET Recent Labs    09/14/17 1100 09/15/17 0505  NA 144 145  K 3.6 4.0  CL 110 115*  CO2 27 24  GLUCOSE 119* 83  BUN 15 12  CREATININE 0.76 0.63  CALCIUM 8.9 7.8*   PT/INR No results for input(s): LABPROT, INR in the last 72 hours.  Studies/Results: Dg Chest 2 View  Result Date: 09/14/2017 CLINICAL DATA:  Nausea, vomiting, abdominal pain EXAM: CHEST - 2 VIEW COMPARISON:  02/04/2014 FINDINGS: Heart is borderline in size. Tortuosity of the thoracic aorta. No confluent airspace opacities or effusions. No acute bony abnormality. IMPRESSION: No active cardiopulmonary disease. Electronically Signed   By: Rolm Baptise M.D.   On: 09/14/2017 11:39   Ct Abdomen Pelvis W Contrast  Result Date: 09/14/2017 CLINICAL DATA:  Generalized abdominal pain with nausea and vomiting since last night. History of colon cancer. EXAM: CT ABDOMEN AND PELVIS WITH CONTRAST TECHNIQUE: Multidetector CT imaging of the abdomen and pelvis was performed using the standard protocol following bolus administration of intravenous contrast. CONTRAST:  191mL ISOVUE-300 IOPAMIDOL (ISOVUE-300) INJECTION 61%  COMPARISON:  Abdominal ultrasound 04/08/2017. Abdominopelvic CT 09/24/2011. FINDINGS: Lower chest: The heart is enlarged. There is atherosclerosis of the aorta and coronary arteries. No significant pleural or pericardial effusion. There is mild scarring at both lung bases. Hepatobiliary: The liver is normal in density without focal abnormality. No evidence of gallstones, gallbladder wall thickening or biliary dilatation. Pancreas: Probable pancreas divisum. No evidence of pancreatic mass, ductal dilatation or surrounding inflammation. Spleen: Normal in size without focal abnormality. Adrenals/Urinary Tract: Both adrenal glands appear normal. There are bilateral renal cysts, measuring up to 2.8 cm in the lower pole of the right kidney and 3.3 cm in the upper pole of the left kidney. No evidence of renal mass, urinary tract calculus or hydronephrosis. The bladder appears normal. Stomach/Bowel: The distal stomach is incompletely distended. The proximal small bowel appears normal. Patient has undergone interval right colectomy with an ileal to transverse colon anastomosis. There is moderate dilatation of the mid to distal small bowel. There is no well-defined transition point, although the distal 15 cm of the small bowel proximal to the anastomosis appears decompressed. The colon is also decompressed. There are diverticular changes throughout the remaining colon. No focal surrounding inflammatory changes. Vascular/Lymphatic: There are no enlarged abdominal or pelvic lymph nodes. There is stable aortic and branch vessel atherosclerosis with tortuosity. Reproductive: Hysterectomy.  No adnexal mass. Other: Trace pelvic ascites. There is no peritoneal nodularity, focal extraluminal fluid collection or free air. Postsurgical changes in the anterior abdominal wall. Musculoskeletal: No acute or significant osseous findings. There are degenerative changes throughout the spine. There is  a grade 1 degenerative anterolisthesis at  L4-5 with some associated foraminal narrowing. IMPRESSION: 1. Distal small bowel obstruction. Specific etiology not demonstrated by this examination, although an ill-defined transition point is noted approximately 15 cm proximal to the ileocolonic anastomosis. 2. Colonic diverticulosis without evidence of acute diverticulitis. 3. No evidence of metastatic colon cancer. 4. Bilateral renal cysts.  Aortic Atherosclerosis (ICD10-I70.0). Electronically Signed   By: Richardean Sale M.D.   On: 09/14/2017 13:30    Anti-infectives: Anti-infectives (From admission, onward)   None      Assessment/Plan: Impression: Partial bowel obstruction resolved Plan: No need for surgical intervention.  Agree with discharge.  Will sign off.  LOS: 2 days    Aviva Signs 09/16/2017

## 2017-09-16 NOTE — Discharge Summary (Signed)
Physician Discharge Summary  Sandra Williams WPY:[id number] DOB: Sep 20, 1925 DOA: 09/14/2017  PCP: Rosita Fire, MD  Admit date: 09/14/2017 Discharge date: 09/16/2017  Admitted From: Home  Disposition: Home   Discharge Condition: STABLE   CODE STATUS: DNR    Brief Hospitalization Summary: Please see all hospital notes, images, labs for full details of the hospitalization.  HPI: Sandra Williams is a 84 y.o. female with medical history significant of HTN, HLD, presented to the hospital with complaints of abdominal pain. Onset of symptoms occurred 1-2 days ago. Pain was sharp and diffuse throughout abdomen. Yesterday she developed significant nausea and vomiting. She has not had a bowel movement or flatus in 2 days. She has not had any fever. She reports onset of symptoms occurring after she ate a hot dog 2 days ago.   ED Course: lab work noted to be unremarkable.  Patient underwent CT abdomen that showed small bowel obstruction.  She received Zofran and morphine for nausea and abdominal pain.  She is feeling better after that.  She is been referred for admission.  Brief Narrative:  84 year old female admitted to the hospital with abdominal pain and vomiting.  CT scan confirms small bowel obstruction.  Admitted for supportive treatment.  General surgery was consulted.  Assessment & Plan:   Active Problems:   HLD (hyperlipidemia)   HYPERTENSION, BENIGN ESSENTIAL   Small bowel obstruction ([place])  1. Small bowel obstruction.  Appears to be improving with supportive treatment.  Pt is tolerating a soft diet with no problems.  Pt has had multiple bowel movements.  She is passing gas and overall nausea and vomiting is better.  General surgery.  Continue supportive management. 2. Hypertension.  Resume losartan and amlodipine 3. Hyperlipidemia.  Resume statin.  DVT prophylaxis: Lovenox Code Status: DNR Family Communication: No family present Disposition Plan: Discharge home once  improved  Consultants:   General surgery  Discharge Diagnoses:  Active Problems:   HLD (hyperlipidemia)   HYPERTENSION, BENIGN ESSENTIAL   Small bowel obstruction [facility])  Discharge Instructions: Discharge Instructions    Call MD for:  difficulty breathing, headache or visual disturbances   Complete by:  As directed    Call MD for:  extreme fatigue   Complete by:  As directed    Call MD for:  persistant dizziness or light-headedness   Complete by:  As directed    Call MD for:  persistant nausea and vomiting   Complete by:  As directed    Call MD for:  severe uncontrolled pain   Complete by:  As directed    Diet - low sodium heart healthy   Complete by:  As directed    Increase activity slowly   Complete by:  As directed      Allergies as of 09/16/2017      Reactions   Aspirin    REACTION: Upset stomach if takes regular and uncoated       Medication List    TAKE these medications   acetaminophen 325 MG tablet Commonly known as:  TYLENOL Take by mouth every 6 (six) hours as needed.   alendronate 70 MG tablet Commonly known as:  FOSAMAX Take 70 mg by mouth once a week. Take with a full glass of water on an empty stomach.   amLODipine 5 MG tablet Commonly known as:  NORVASC Take 5 mg by mouth daily.   aspirin 81 MG tablet Take 81 mg by mouth daily.   docusate sodium 100 MG capsule Commonly known as:  COLACE Take 1 capsule (100 mg total) by mouth 2 (two) times daily.   losartan 100 MG tablet Commonly known as:  COZAAR Take 100 mg by mouth daily.   pravastatin 40 MG tablet Commonly known as:  PRAVACHOL Take 40 mg by mouth every morning.      Follow-up Information    Rosita Fire, MD. Schedule an appointment as soon as possible for a visit in 1 week(s).   Specialty:  Internal Medicine Why:  hOSPITAL fOLLOW UP  Contact information: [address] [place] [address] [phone]          Allergies  Allergen Reactions  . Aspirin      REACTION: Upset stomach if takes regular and uncoated    Allergies as of 09/16/2017      Reactions   Aspirin    REACTION: Upset stomach if takes regular and uncoated       Medication List    TAKE these medications   acetaminophen 325 MG tablet Commonly known as:  TYLENOL Take by mouth every 6 (six) hours as needed.   alendronate 70 MG tablet Commonly known as:  FOSAMAX Take 70 mg by mouth once a week. Take with a full glass of water on an empty stomach.   amLODipine 5 MG tablet Commonly known as:  NORVASC Take 5 mg by mouth daily.   aspirin 81 MG tablet Take 81 mg by mouth daily.   docusate sodium 100 MG capsule Commonly known as:  COLACE Take 1 capsule (100 mg total) by mouth 2 (two) times daily.   losartan 100 MG tablet Commonly known as:  COZAAR Take 100 mg by mouth daily.   pravastatin 40 MG tablet Commonly known as:  PRAVACHOL Take 40 mg by mouth every morning.       Procedures/Studies: Dg Chest 2 View  Result Date: 09/14/2017 CLINICAL DATA:  Nausea, vomiting, abdominal pain EXAM: CHEST - 2 VIEW COMPARISON:  02/04/2014 FINDINGS: Heart is borderline in size. Tortuosity of the thoracic aorta. No confluent airspace opacities or effusions. No acute bony abnormality. IMPRESSION: No active cardiopulmonary disease. Electronically Signed   By: Rolm Baptise M.D.   On: 09/14/2017 11:39   Ct Abdomen Pelvis W Contrast  Result Date: 09/14/2017 CLINICAL DATA:  Generalized abdominal pain with nausea and vomiting since last night. History of colon cancer. EXAM: CT ABDOMEN AND PELVIS WITH CONTRAST TECHNIQUE: Multidetector CT imaging of the abdomen and pelvis was performed using the standard protocol following bolus administration of intravenous contrast. CONTRAST:  117mL ISOVUE-300 IOPAMIDOL (ISOVUE-300) INJECTION 61% COMPARISON:  Abdominal ultrasound 04/08/2017. Abdominopelvic CT 09/24/2011. FINDINGS: Lower chest: The heart is enlarged. There is atherosclerosis of the aorta  and coronary arteries. No significant pleural or pericardial effusion. There is mild scarring at both lung bases. Hepatobiliary: The liver is normal in density without focal abnormality. No evidence of gallstones, gallbladder wall thickening or biliary dilatation. Pancreas: Probable pancreas divisum. No evidence of pancreatic mass, ductal dilatation or surrounding inflammation. Spleen: Normal in size without focal abnormality. Adrenals/Urinary Tract: Both adrenal glands appear normal. There are bilateral renal cysts, measuring up to 2.8 cm in the lower pole of the right kidney and 3.3 cm in the upper pole of the left kidney. No evidence of renal mass, urinary tract calculus or hydronephrosis. The bladder appears normal. Stomach/Bowel: The distal stomach is incompletely distended. The proximal small bowel appears normal. Patient has undergone interval right colectomy with an ileal to transverse colon anastomosis. There is moderate dilatation of the  mid to distal small bowel. There is no well-defined transition point, although the distal 15 cm of the small bowel proximal to the anastomosis appears decompressed. The colon is also decompressed. There are diverticular changes throughout the remaining colon. No focal surrounding inflammatory changes. Vascular/Lymphatic: There are no enlarged abdominal or pelvic lymph nodes. There is stable aortic and branch vessel atherosclerosis with tortuosity. Reproductive: Hysterectomy.  No adnexal mass. Other: Trace pelvic ascites. There is no peritoneal nodularity, focal extraluminal fluid collection or free air. Postsurgical changes in the anterior abdominal wall. Musculoskeletal: No acute or significant osseous findings. There are degenerative changes throughout the spine. There is a grade 1 degenerative anterolisthesis at L4-5 with some associated foraminal narrowing. IMPRESSION: 1. Distal small bowel obstruction. Specific etiology not demonstrated by this examination, although  an ill-defined transition point is noted approximately 15 cm proximal to the ileocolonic anastomosis. 2. Colonic diverticulosis without evidence of acute diverticulitis. 3. No evidence of metastatic colon cancer. 4. Bilateral renal cysts.  Aortic Atherosclerosis (ICD10-I70.0). Electronically Signed   By: Richardean Sale M.D.   On: 09/14/2017 13:30      Subjective: Pt says she feels much better.  She is having multiple bowel movements and passing flatus and tolerating soft diet well.    Discharge Exam: Vitals:   09/15/17 2314 09/16/17 0500  BP: (!) 148/64 (!) 133/58  Pulse: 72 66  Resp: 16 20  Temp: 98.7 F (37.1 C) 98.3 F (36.8 C)  SpO2: 96% 98%   Vitals:   09/15/17 0640 09/15/17 1459 09/15/17 2314 09/16/17 0500  BP: 119/76 (!) 118/96 (!) 148/64 (!) 133/58  Pulse: 71 66 72 66  Resp: 18 18 16 20   Temp: 98.4 F (36.9 C) 98.7 F (37.1 C) 98.7 F (37.1 C) 98.3 F (36.8 C)  TempSrc: Oral Oral Oral Oral  SpO2: 97% 99% 96% 98%  Weight:      Height:       General exam: Appears calm and comfortable.   Respiratory system: Clear to auscultation. Respiratory effort normal. Cardiovascular system: S1 & S2 heard, RRR. No JVD, murmurs, rubs, gallops or clicks. No pedal edema. Gastrointestinal system: Abdomen is nondistended, soft and nontender. No organomegaly or masses felt. Normal bowel sounds heard. Central nervous system: Alert and oriented. No focal neurological deficits. Extremities: Symmetric 5 x 5 power. Skin: No rashes, lesions or ulcers.  Psychiatry: Judgement and insight appear normal. Mood & affect appropriate.    The results of significant diagnostics from this hospitalization (including imaging, microbiology, ancillary and laboratory) are listed below for reference.     Microbiology: Recent Results (from the past 240 hour(s))  Urine culture     Status: None   Collection Time: 09/14/17 11:18 AM  Result Value Ref Range Status   Specimen Description   Final    URINE,  CLEAN CATCH Performed at [facility], [address], [place], [place] [address]    Special Requests   Final    NONE Performed at [facility], [address] [place], [place] [address]    Culture   Final    NO GROWTH Performed at [facility], [place] [address] [place], [place] [address]    Report Status 09/16/2017 FINAL  Final     Labs: BNP (last 3 results) No results for input(s): BNP in the last 8760 hours. Basic Metabolic Panel: Recent Labs  Lab 09/14/17 1100 09/15/17 0505  NA 144 145  K 3.6 4.0  CL 110 115*  CO2 27 24  GLUCOSE 119* 83  BUN 15 12  CREATININE 0.76 0.63  CALCIUM 8.9 7.8*   Liver Function Tests: Recent Labs  Lab 09/14/17 1100  AST 25  ALT 18  ALKPHOS 59  BILITOT 0.5  PROT 7.1  ALBUMIN 3.9   Recent Labs  Lab 09/14/17 1100  LIPASE 29   No results for input(s): AMMONIA in the last 168 hours. CBC: Recent Labs  Lab 09/14/17 1100 09/15/17 0505  WBC 6.8 5.9  NEUTROABS 5.2  --   HGB 13.2 12.2  HCT 40.6 38.7  MCV 89.6 91.1  PLT 231 238   Cardiac Enzymes: Recent Labs  Lab 09/14/17 1123  TROPONINI <0.03   BNP: Invalid input(s): POCBNP CBG: No results for input(s): GLUCAP in the last 168 hours. D-Dimer No results for input(s): DDIMER in the last 72 hours. Hgb A1c No results for input(s): HGBA1C in the last 72 hours. Lipid Profile No results for input(s): CHOL, HDL, LDLCALC, TRIG, CHOLHDL, LDLDIRECT in the last 72 hours. Thyroid function studies No results for input(s): TSH, T4TOTAL, T3FREE, THYROIDAB in the last 72 hours.  Invalid input(s): FREET3 Anemia work up No results for input(s): VITAMINB12, FOLATE, FERRITIN, TIBC, IRON, RETICCTPCT in the last 72 hours. Urinalysis    Component Value Date/Time   COLORURINE YELLOW 09/14/2017 1048   APPEARANCEUR HAZY (A) 09/14/2017 1048   LABSPEC 1.022 09/14/2017 1048   PHURINE 7.0 09/14/2017 1048   GLUCOSEU NEGATIVE 09/14/2017 1048   HGBUR SMALL (A) 09/14/2017 1048   HGBUR  negative 03/01/2008 1450   BILIRUBINUR NEGATIVE 09/14/2017 1048   KETONESUR NEGATIVE 09/14/2017 1048   PROTEINUR >=300 (A) 09/14/2017 1048   UROBILINOGEN 0.2 10/06/2014 0800   NITRITE NEGATIVE 09/14/2017 1048   LEUKOCYTESUR NEGATIVE 09/14/2017 1048   Sepsis Labs Invalid input(s): PROCALCITONIN,  WBC,  LACTICIDVEN Microbiology Recent Results (from the past 240 hour(s))  Urine culture     Status: None   Collection Time: 09/14/17 11:18 AM  Result Value Ref Range Status   Specimen Description   Final    URINE, CLEAN CATCH Performed at [facility], [address] [place], [place] [address]    Special Requests   Final    NONE Performed at [facility], [address] [place], [place] [address]    Culture   Final    NO GROWTH Performed at [facility], [place] [address] [place],  [address]    Report Status 09/16/2017 FINAL  Final   Time coordinating discharge: 58 MINS  SIGNED:  Irwin Brakeman, MD  [facility] 09/16/2017, 8:58 AM Pager [phone]  If 7PM-7AM, please contact night-coverage [email or web address] Password [email or web address]

## 2017-09-16 NOTE — Care Management Important Message (Signed)
Important Message  Patient Details  Name: Enrika Aguado MRN: [id number] Date of Birth: 1925/02/18   Medicare Important Message Given:  Yes    Shelda Altes 09/16/2017, 10:17 AM

## 2017-09-16 NOTE — Discharge Instructions (Signed)
PLEASE CONTINUE SOFT FOODS DIET FOR AT LEAST 1 WEEK.   Soft-Food Meal Plan A soft-food meal plan includes foods that are safe and easy to swallow. This meal plan typically is used:  If you are having trouble chewing or swallowing foods.  As a transition meal plan after only having had liquid meals for a long period.  What do I need to know about the soft-food meal plan? A soft-food meal plan includes tender foods that are soft and easy to chew and swallow. In most cases, bite-sized pieces of food are easier to swallow. A bite-sized piece is about  inch or smaller. Foods in this plan do not need to be ground or pureed. Foods that are very hard, crunchy, or sticky should be avoided. Also, breads, cereals, yogurts, and desserts with nuts, seeds, or fruits should be avoided. What foods can I eat? Grains Rice and wild rice. Moist bread, dressing, pasta, and noodles. Well-moistened dry or cooked cereals, such as farina (cooked wheat cereal), oatmeal, or grits. Biscuits, breads, muffins, pancakes, and waffles that have been well moistened. Vegetables Shredded lettuce. Cooked, tender vegetables, including potatoes without skins. Vegetable juices. Broths or creamed soups made with vegetables that are not stringy or chewy. Strained tomatoes (without seeds). Fruits Canned or well-cooked fruits. Soft (ripe), peeled fresh fruits, such as peaches, nectarines, kiwi, cantaloupe, honeydew melon, and watermelon (without seeds). Soft berries with small seeds, such as strawberries. Fruit juices (without pulp). Meats and Other Protein Sources Moist, tender, lean beef. Mutton. Lamb. Veal. Chicken. [place]. Liver. Ham. Fish without bones. Eggs. Dairy Milk, milk drinks, and cream. Plain cream cheese and cottage cheese. Plain yogurt. Sweets/Desserts Flavored gelatin desserts. Custard. Plain ice cream, frozen yogurt, sherbet, milk shakes, and malts. Plain cakes and cookies. Plain hard candy. Other Butter, margarine  (without trans fat), and cooking oils. Mayonnaise. Cream sauces. Mild spices, salt, and sugar. Syrup, molasses, honey, and jelly. The items listed above may not be a complete list of recommended foods or beverages. Contact your dietitian for more options. What foods are not recommended? Grains Dry bread, toast, crackers that have not been moistened. Coarse or dry cereals, such as bran, granola, and shredded wheat. Tough or chewy crusty breads, such as [place] bread or baguettes. Vegetables Corn. Raw vegetables except shredded lettuce. Cooked vegetables that are tough or stringy. Tough, crisp, fried potatoes and potato skins. Fruits Fresh fruits with skins or seeds or both, such as apples, pears, or grapes. Stringy, high-pulp fruits, such as papaya, pineapple, coconut, or mango. Fruit leather, fruit roll-ups, and all dried fruits. Meats and Other Protein Sources Sausages and hot dogs. Meats with gristle. Fish with bones. Nuts, seeds, and chunky peanut or other nut butters. Sweets/Desserts Cakes or cookies that are very dry or chewy. The items listed above may not be a complete list of foods and beverages to avoid. Contact your dietitian for more information. This information is not intended to replace advice given to you by your health care provider. Make sure you discuss any questions you have with your health care provider. Document Released: 03/30/2007 Document Revised: 05/29/2015 Document Reviewed: 11/17/2012 Elsevier Interactive Patient Education  2017 [employer]  Follow with Primary MD  Rosita Fire, MD  and other consultant's as instructed your Hospitalist MD  Please get a complete blood count and chemistry panel checked by your Primary MD at your next visit, and again as instructed by your Primary MD.  Get Medicines reviewed and adjusted: Please take all your medications with you  for your next visit with your Primary MD  Laboratory/radiological data: Please request your Primary  MD to go over all hospital tests and procedure/radiological results at the follow up, please ask your Primary MD to get all Hospital records sent to his/her office.  In some cases, they will be blood work, cultures and biopsy results pending at the time of your discharge. Please request that your primary care M.D. follows up on these results.  Also Note the following: If you experience worsening of your admission symptoms, develop shortness of breath, life threatening emergency, suicidal or homicidal thoughts you must seek medical attention immediately by calling 911 or calling your MD immediately  if symptoms less severe.  You must read complete instructions/literature along with all the possible adverse reactions/side effects for all the Medicines you take and that have been prescribed to you. Take any new Medicines after you have completely understood and accpet all the possible adverse reactions/side effects.   Do not drive when taking Pain medications or sleeping medications (Benzodaizepines)  Do not take more than prescribed Pain, Sleep and Anxiety Medications. It is not advisable to combine anxiety,sleep and pain medications without talking with your primary care practitioner  Special Instructions: If you have smoked or chewed Tobacco  in the last 2 yrs please stop smoking, stop any regular Alcohol  and or any Recreational drug use.  Wear Seat belts while driving.  Please note: You were cared for by a hospitalist during your hospital stay. Once you are discharged, your primary care physician will handle any further medical issues. Please note that NO REFILLS for any discharge medications will be authorized once you are discharged, as it is imperative that you return to your primary care physician (or establish a relationship with a primary care physician if you do not have one) for your post hospital discharge needs so that they can reassess your need for medications and monitor your lab  values.     Small Bowel Obstruction A small bowel obstruction means that something is blocking the small bowel. The small bowel is also called the small intestine. It is the long tube that connects the stomach to the colon. An obstruction will stop food and fluids from passing through the small bowel. Treatment depends on what is causing the problem and how bad the problem is. Follow these instructions at home:  Get a lot of rest.  Follow your diet as told by your doctor. You may need to: ? Only drink clear liquids until you start to get better. ? Avoid solid foods as told by your doctor.  Take over-the-counter and prescription medicines only as told by your doctor.  Keep all follow-up visits as told by your doctor. This is important. Contact a doctor if:  You have a fever.  You have chills. Get help right away if:  You have pain or cramps that get worse.  You throw up (vomit) blood.  You have a feeling of being sick to your stomach (nausea) that does not go away.  You cannot stop throwing up.  You cannot drink fluids.  You feel confused.  You feel dry or thirsty (dehydrated).  Your belly gets more bloated.  You feel weak or you pass out (faint). This information is not intended to replace advice given to you by your health care provider. Make sure you discuss any questions you have with your health care provider. Document Released: 01/29/2004 Document Revised: 08/18/2015 Document Reviewed: 02/14/2014 Elsevier Interactive Patient Education  2018  [employer].

## 2017-09-23 DIAGNOSIS — E7849 Other hyperlipidemia: Secondary | ICD-10-CM | POA: Diagnosis not present

## 2017-09-23 DIAGNOSIS — K56609 Unspecified intestinal obstruction, unspecified as to partial versus complete obstruction: Secondary | ICD-10-CM | POA: Diagnosis not present

## 2017-09-23 DIAGNOSIS — I10 Essential (primary) hypertension: Secondary | ICD-10-CM | POA: Diagnosis not present

## 2017-11-21 ENCOUNTER — Other Ambulatory Visit: Payer: Self-pay

## 2017-11-24 DIAGNOSIS — I2510 Atherosclerotic heart disease of native coronary artery without angina pectoris: Secondary | ICD-10-CM | POA: Diagnosis not present

## 2017-11-24 DIAGNOSIS — I10 Essential (primary) hypertension: Secondary | ICD-10-CM | POA: Diagnosis not present

## 2017-11-24 DIAGNOSIS — R300 Dysuria: Secondary | ICD-10-CM | POA: Diagnosis not present

## 2017-11-24 DIAGNOSIS — K219 Gastro-esophageal reflux disease without esophagitis: Secondary | ICD-10-CM | POA: Diagnosis not present

## 2017-11-24 DIAGNOSIS — M810 Age-related osteoporosis without current pathological fracture: Secondary | ICD-10-CM | POA: Diagnosis not present

## 2017-12-23 DIAGNOSIS — J209 Acute bronchitis, unspecified: Secondary | ICD-10-CM | POA: Diagnosis not present

## 2018-02-28 DIAGNOSIS — M810 Age-related osteoporosis without current pathological fracture: Secondary | ICD-10-CM | POA: Diagnosis not present

## 2018-02-28 DIAGNOSIS — I2510 Atherosclerotic heart disease of native coronary artery without angina pectoris: Secondary | ICD-10-CM | POA: Diagnosis not present

## 2018-02-28 DIAGNOSIS — I10 Essential (primary) hypertension: Secondary | ICD-10-CM | POA: Diagnosis not present

## 2018-02-28 DIAGNOSIS — E785 Hyperlipidemia, unspecified: Secondary | ICD-10-CM | POA: Diagnosis not present

## 2018-05-25 DIAGNOSIS — I10 Essential (primary) hypertension: Secondary | ICD-10-CM | POA: Diagnosis not present

## 2018-05-25 DIAGNOSIS — E7849 Other hyperlipidemia: Secondary | ICD-10-CM | POA: Diagnosis not present

## 2018-05-25 DIAGNOSIS — I2510 Atherosclerotic heart disease of native coronary artery without angina pectoris: Secondary | ICD-10-CM | POA: Diagnosis not present

## 2018-05-25 DIAGNOSIS — R42 Dizziness and giddiness: Secondary | ICD-10-CM | POA: Diagnosis not present

## 2018-06-01 ENCOUNTER — Ambulatory Visit (INDEPENDENT_AMBULATORY_CARE_PROVIDER_SITE_OTHER): Payer: Medicare Other | Admitting: Orthopaedic Surgery

## 2018-06-01 ENCOUNTER — Ambulatory Visit (INDEPENDENT_AMBULATORY_CARE_PROVIDER_SITE_OTHER): Payer: Medicare Other

## 2018-06-01 ENCOUNTER — Other Ambulatory Visit: Payer: Self-pay

## 2018-06-01 ENCOUNTER — Encounter: Payer: Self-pay | Admitting: Orthopaedic Surgery

## 2018-06-01 VITALS — BP 157/67 | HR 70 | Temp 97.3°F | Ht 63.0 in | Wt 140.0 lb

## 2018-06-01 DIAGNOSIS — L989 Disorder of the skin and subcutaneous tissue, unspecified: Secondary | ICD-10-CM

## 2018-06-01 DIAGNOSIS — D1721 Benign lipomatous neoplasm of skin and subcutaneous tissue of right arm: Secondary | ICD-10-CM | POA: Diagnosis not present

## 2018-06-01 NOTE — Progress Notes (Signed)
Subjective:    Patient ID: Sandra Williams, female    DOB: 1925/04/03, 85 y.o.   MRN: [id number]  HPI She has slowly developed swelling of the lateral forearm on the right proximally and on the ulnar side.  She has no trauma, no redness.  It hurts to put her arm against the arm rest of a chair.  She has no numbness. She wants the lesion removed.   Review of Systems  Constitutional: Positive for activity change.  Musculoskeletal: Positive for arthralgias and myalgias.  All other systems reviewed and are negative.  For Review of Systems, all other systems reviewed and are negative.  The following is a summary of the past history medically, past history surgically, known current medicines, social history and family history.  This information is gathered electronically by the computer from prior information and documentation.  I review this each visit and have found including this information at this point in the chart is beneficial and informative.   Past Medical History:  Diagnosis Date  . Arthritis   . Cancer ([place])    cecal carcinoma  . Colon cancer ([place]) 02/01/2012   Stage I (T2, N0, M0) cancer the cecum status post resection by Dr. Arnoldo Morale with 14 lymph nodes found all of which were negative. She had no evidence for metastatic disease on CT scan of the abdomen and pelvis either. Her date of surgery was 10/22/2011.  Not in need of chemotherapy for her stage I cancer.   Marland Kitchen GERD (gastroesophageal reflux disease)   . HTN (hypertension)   . Hyperlipidemia   . Iron deficiency anemia 02/01/2012   At time of colon cancer presentation  . Knee joint pain 2014   right  . Osteoporosis     Past Surgical History:  Procedure Laterality Date  . ABDOMINAL HYSTERECTOMY     partial  . APPENDECTOMY     [facility]  . CATARACT EXTRACTION Bilateral   . CESAREAN SECTION    . COLON SURGERY    . dental implant    . PARTIAL COLECTOMY  10/22/2011   Procedure: PARTIAL COLECTOMY;  Surgeon: Jamesetta So,  MD;  Location: [facility];  Service: General;  Laterality: N/A;  . TOTAL KNEE ARTHROPLASTY Right 01/30/2014   Procedure: TOTAL KNEE ARTHROPLASTY;  Surgeon: Carole Civil, MD;  Location: [facility];  Service: Orthopedics;  Laterality: Right;    Current Outpatient Medications on File Prior to Visit  Medication Sig Dispense Refill  . acetaminophen (TYLENOL) 325 MG tablet Take by mouth every 6 (six) hours as needed.    Marland Kitchen alendronate (FOSAMAX) 70 MG tablet Take 70 mg by mouth once a week. Take with a full glass of water on an empty stomach.    Marland Kitchen amLODipine (NORVASC) 5 MG tablet Take 5 mg by mouth daily.  3  . aspirin 81 MG tablet Take 81 mg by mouth daily.    Marland Kitchen losartan (COZAAR) 100 MG tablet Take 100 mg by mouth daily.    . pravastatin (PRAVACHOL) 40 MG tablet Take 40 mg by mouth every morning.      No current facility-administered medications on file prior to visit.     Social History   Socioeconomic History  . Marital status: Single    Spouse name: Not on file  . Number of children: Not on file  . Years of education: Not on file  . Highest education level: Not on file  Occupational History  . Not on file  Social Needs  .  Financial resource strain: Not on file  . Food insecurity:    Worry: Not on file    Inability: Not on file  . Transportation needs:    Medical: Not on file    Non-medical: Not on file  Tobacco Use  . Smoking status: Never Smoker  . Smokeless tobacco: Never Used  Substance and Sexual Activity  . Alcohol use: No  . Drug use: No  . Sexual activity: Never    Birth control/protection: Surgical  Lifestyle  . Physical activity:    Days per week: Not on file    Minutes per session: Not on file  . Stress: Not on file  Relationships  . Social connections:    Talks on phone: Not on file    Gets together: Not on file    Attends religious service: Not on file    Active member of club or organization: Not on file    Attends meetings of clubs or organizations: Not on  file    Relationship status: Not on file  . Intimate partner violence:    Fear of current or ex partner: Not on file    Emotionally abused: Not on file    Physically abused: Not on file    Forced sexual activity: Not on file  Other Topics Concern  . Not on file  Social History Narrative  . Not on file    Family History  Problem Relation Age of Onset  . Hypertension Mother   . Cancer Sister   . Breast cancer Daughter   . Hypertension Daughter   . Colon cancer Neg Hx   . Liver disease Neg Hx     BP (!) 157/67   Pulse 70   Temp (!) 97.3 F (36.3 C)   Ht 5\' 3"  (1.6 m)   Wt 140 lb (63.5 kg)   BMI 24.80 kg/m   Body mass index is 24.8 kg/m.     Objective:   Physical Exam Vitals signs reviewed.  Constitutional:      Appearance: She is well-developed.  HENT:     Head: Normocephalic and atraumatic.  Eyes:     Conjunctiva/sclera: Conjunctivae normal.     Pupils: Pupils are equal, round, and reactive to light.  Neck:     Musculoskeletal: Normal range of motion and neck supple.  Cardiovascular:     Rate and Rhythm: Normal rate and regular rhythm.  Pulmonary:     Effort: Pulmonary effort is normal.  Abdominal:     Palpations: Abdomen is soft.  Musculoskeletal:     Right wrist: Normal.       Arms:  Skin:    General: Skin is warm and dry.  Neurological:     Mental Status: She is alert and oriented to person, place, and time.     Cranial Nerves: No cranial nerve deficit.     Motor: No abnormal muscle tone.     Coordination: Coordination normal.     Deep Tendon Reflexes: Reflexes are normal and symmetric. Reflexes normal.  Psychiatric:        Behavior: Behavior normal.        Thought Content: Thought content normal.        Judgment: Judgment normal.   x-rays were done of the right forearm, reported separately.        Assessment & Plan:   Encounter Diagnoses  Name Primary?  . Lipoma of right upper extremity   . Arm lesion Yes   I attempted aspiration  of  the lesion, it is soft and has no fluid.  Therefore, I think it is a lipoma.  I told her she could watch it or have it removed. She wants it removed.  I will have her see Dr. Aline Brochure.  Call if any problem.  Precautions discussed.   Electronically Signed Sanjuana Kava, MD 5/28/202011:18 AM

## 2018-06-09 ENCOUNTER — Ambulatory Visit (INDEPENDENT_AMBULATORY_CARE_PROVIDER_SITE_OTHER): Payer: Medicare Other | Admitting: Orthopedic Surgery

## 2018-06-09 ENCOUNTER — Other Ambulatory Visit: Payer: Self-pay

## 2018-06-09 ENCOUNTER — Encounter: Payer: Self-pay | Admitting: Orthopedic Surgery

## 2018-06-09 VITALS — BP 156/77 | HR 94 | Ht 63.0 in | Wt 140.0 lb

## 2018-06-09 DIAGNOSIS — D1721 Benign lipomatous neoplasm of skin and subcutaneous tissue of right arm: Secondary | ICD-10-CM | POA: Diagnosis not present

## 2018-06-09 NOTE — Progress Notes (Signed)
PREOP CONSULT/REFERRAL INTRA-OFFICE FROM DR Tyrone Apple   Chief Complaint  Patient presents with  . Arm Problem    lipoma right forearm discuss surgery     85 year old female who had a knee replacement and did well presents with a mass on the proximal aspect of her right forearm on the ulnar side in the subcutaneous tissue which she says is now painful and enlarging in size.  The pain is severe enough to cause her significant discomfort when she is resting it on an armrest.  She denies any trauma the lesion has been present for several years changing in size and she has some numbness and tingling of her right upper extremity   Review of Systems  Constitutional: Negative for fever and weight loss.  Skin: Negative for rash.  Neurological: Positive for tingling and weakness.     Past Medical History:  Diagnosis Date  . Arthritis   . Cancer ([place])    cecal carcinoma  . Colon cancer ([place]) 02/01/2012   Stage I (T2, N0, M0) cancer the cecum status post resection by Dr. Arnoldo Morale with 14 lymph nodes found all of which were negative. She had no evidence for metastatic disease on CT scan of the abdomen and pelvis either. Her date of surgery was 10/22/2011.  Not in need of chemotherapy for her stage I cancer.   Marland Kitchen GERD (gastroesophageal reflux disease)   . HTN (hypertension)   . Hyperlipidemia   . Iron deficiency anemia 02/01/2012   At time of colon cancer presentation  . Knee joint pain 2014   right  . Osteoporosis     Past Surgical History:  Procedure Laterality Date  . ABDOMINAL HYSTERECTOMY     partial  . APPENDECTOMY     [facility]  . CATARACT EXTRACTION Bilateral   . CESAREAN SECTION    . COLON SURGERY    . dental implant    . PARTIAL COLECTOMY  10/22/2011   Procedure: PARTIAL COLECTOMY;  Surgeon: Jamesetta So, MD;  Location: [facility];  Service: General;  Laterality: N/A;  . TOTAL KNEE ARTHROPLASTY Right 01/30/2014   Procedure: TOTAL KNEE ARTHROPLASTY;  Surgeon: Carole Civil, MD;   Location: [facility];  Service: Orthopedics;  Laterality: Right;    Family History  Problem Relation Age of Onset  . Hypertension Mother   . Cancer Sister   . Breast cancer Daughter   . Hypertension Daughter   . Colon cancer Neg Hx   . Liver disease Neg Hx    Social History   Tobacco Use  . Smoking status: Never Smoker  . Smokeless tobacco: Never Used  Substance Use Topics  . Alcohol use: No  . Drug use: No    Allergies  Allergen Reactions  . Aspirin     REACTION: Upset stomach if takes regular and uncoated      Current Meds  Medication Sig  . acetaminophen (TYLENOL) 325 MG tablet Take by mouth every 6 (six) hours as needed.  Marland Kitchen alendronate (FOSAMAX) 70 MG tablet Take 70 mg by mouth once a week. Take with a full glass of water on an empty stomach.  Marland Kitchen amLODipine (NORVASC) 5 MG tablet Take 5 mg by mouth daily.  Marland Kitchen aspirin 81 MG tablet Take 81 mg by mouth daily.  Marland Kitchen losartan (COZAAR) 100 MG tablet Take 100 mg by mouth daily.  . pravastatin (PRAVACHOL) 40 MG tablet Take 40 mg by mouth every morning.     BP (!) 156/77   Pulse 94  Ht 5\' 3"  (1.6 m)   Wt 140 lb (63.5 kg)   BMI 24.80 kg/m   Physical Exam Vitals signs and nursing note reviewed.  Constitutional:      Appearance: Normal appearance.  Neurological:     Mental Status: She is alert and oriented to person, place, and time.  Psychiatric:        Mood and Affect: Mood normal.     Ortho Exam Left forearm no tenderness full range of motion no instability normal muscle tone skin is normal with no evidence of a mass neurovascular exam is intact  Right forearm there is an 8 x 6 subcutaneous lesion near the right proximal forearm on the ulnar side.  The lesion has some mild tenderness to it. Range of motion of the elbow is normal Collateral ligaments of the elbow are stable Motor exam flexion extension normal no atrophy no tremor Skin is warm dry clean intact no erythema Axillary supraclavicular lymph nodes are  normal She has some decreased sensation appears to be global right hand Color capillary refill and pulses normal radius and ulna   MEDICAL DECISION SECTION  xrays reviewed from [facility] previously taken My independent reading of xrays: Large soft tissue mass subcutaneous tissue right proximal forearm   Encounter Diagnosis  Name Primary?  .  Mass possible of right upper extremity ( greater than 5 cm) Yes     PLAN:   Soft tissue mass greater than 5 cm inguinal large in size associated with neurovascular complaints recommend MRI with and without contrast pre-MRI creatinine test patient reports no kidney problems  I explained the reason for the MRI because of the size of the lesion if it is less than 5 cm then we can remove it if it is greater than 5 cm she will be sent to a tumor specialist   No orders of the defined types were placed in this encounter.   Arther Abbott, MD 06/09/2018 11:58 AM

## 2018-06-13 ENCOUNTER — Telehealth: Payer: Self-pay | Admitting: Radiology

## 2018-06-13 NOTE — Telephone Encounter (Signed)
Patient's daughter, Henrietta Dine, returned call. Given the MRI appointment - needs the information regarding the labs - stat Creat. [id number]

## 2018-06-13 NOTE — Telephone Encounter (Signed)
I spoke to daughter and she voiced understanding about the labs.

## 2018-06-13 NOTE — Telephone Encounter (Signed)
I have called to schedule MRI at [facility], and Sandra Williams states if stat Creat is in system they can do prior to the scan, it is sch for June 18th 1pm arrive 12:30   Called left message for daughter [name] to call me back.

## 2018-06-22 ENCOUNTER — Other Ambulatory Visit: Payer: Self-pay

## 2018-06-22 ENCOUNTER — Other Ambulatory Visit: Payer: Self-pay | Admitting: Orthopedic Surgery

## 2018-06-22 ENCOUNTER — Ambulatory Visit (HOSPITAL_COMMUNITY)
Admission: RE | Admit: 2018-06-22 | Discharge: 2018-06-22 | Disposition: A | Discharge status: Home / Self Care | Payer: Medicare Other | Source: Ambulatory Visit | Attending: Orthopedic Surgery | Admitting: Orthopedic Surgery

## 2018-06-22 DIAGNOSIS — D1721 Benign lipomatous neoplasm of skin and subcutaneous tissue of right arm: Secondary | ICD-10-CM

## 2018-06-22 DIAGNOSIS — R600 Localized edema: Secondary | ICD-10-CM | POA: Diagnosis not present

## 2018-06-22 LAB — POCT I-STAT CREATININE: Creatinine, Ser: 0.6 mg/dL (ref 0.44–1.00)

## 2018-06-26 ENCOUNTER — Other Ambulatory Visit: Payer: Self-pay | Admitting: Orthopedic Surgery

## 2018-06-26 ENCOUNTER — Telehealth: Payer: Self-pay | Admitting: Orthopedic Surgery

## 2018-06-26 ENCOUNTER — Telehealth: Payer: Self-pay | Admitting: Radiology

## 2018-06-26 NOTE — Telephone Encounter (Signed)
-----   Message from Carole Civil, MD sent at 06/26/2018 12:21 PM EDT ----- Will tell us today when she wants the surgery

## 2018-06-26 NOTE — Telephone Encounter (Signed)
I called her, she will have her daughter call me back, and she will ask for me.

## 2018-06-26 NOTE — Telephone Encounter (Signed)
Told daughter will discuss for date

## 2018-06-26 NOTE — Telephone Encounter (Signed)
Her daughter sch when she was here in the office today

## 2018-06-27 ENCOUNTER — Telehealth: Payer: Self-pay | Admitting: Radiology

## 2018-06-27 NOTE — Telephone Encounter (Signed)
Left message for daughter, [name] to advise we need more time for the covid testing, see if it is okay to do surgery on July 2nd instead of June 30th,   Asked her to call me back and let me know.

## 2018-06-27 NOTE — Telephone Encounter (Signed)
-----   Message from Josue Hector sent at 06/27/2018 12:52 PM EDT ----- Marykay Lex,  Dr. Aline Brochure wants Sandra Williams that was on for today to be done next Tuesday.  They have already scheduled the PICC to be done that morning and rescheduled her Covid test as well.  This patient Sandra Williams is the only one that I have not been able to reach for her Pre Op and Covid test.  She will need to be moved to another surgery day in order for him to do Sandra Williams that day.  Please discuss this with him and advise what he wants to do.  Thanks,  Hoyle Sauer

## 2018-06-28 ENCOUNTER — Other Ambulatory Visit: Payer: Self-pay | Admitting: Internal Medicine

## 2018-06-28 ENCOUNTER — Other Ambulatory Visit: Payer: Medicare Other

## 2018-06-28 DIAGNOSIS — Z20822 Contact with and (suspected) exposure to covid-19: Secondary | ICD-10-CM

## 2018-06-28 DIAGNOSIS — R6889 Other general symptoms and signs: Secondary | ICD-10-CM | POA: Diagnosis not present

## 2018-06-28 NOTE — Patient Instructions (Signed)
Your procedure is scheduled on: 07/06/18  Report to [facility] at  7:20   AM.  Call this number if you have problems the morning of surgery: [phone]   Remember:   Do not Eat or Drink after midnight   :  Take these medicines the morning of surgery with A SIP OF WATER: losartan and amlodipine  Do not wear jewelry, make-up or nail polish.  Do not wear lotions, powders, or perfumes. You may wear deodorant.  Do not shave 48 hours prior to surgery. Men may shave face and neck.  Do not bring valuables to the hospital.  Contacts, dentures or bridgework may not be worn into surgery.  Leave suitcase in the car. After surgery it may be brought to your room.  For patients admitted to the hospital, checkout time is 11:00 AM the day of discharge.   Patients discharged the day of surgery will not be allowed to drive home.    Special Instructions: Shower using CHG night before surgery and shower the day of surgery use CHG.  Use special wash - you have one bottle of CHG for all showers.  You should use approximately 1/2 of the bottle for each shower.  Wound Care, Adult Taking care of your wound properly can help to prevent pain, infection, and scarring. It can also help your wound to heal more quickly. How to care for your wound Wound care      Follow instructions from your health care provider about how to take care of your wound. Make sure you: ? Wash your hands with soap and water before you change the bandage (dressing). If soap and water are not available, use hand sanitizer. ? Change your dressing as told by your health care provider. ? Leave stitches (sutures), skin glue, or adhesive strips in place. These skin closures may need to stay in place for 2 weeks or longer. If adhesive strip edges start to loosen and curl up, you may trim the loose edges. Do not remove adhesive strips completely unless your health care provider tells you to do that.  Check your wound area every day for signs  of infection. Check for: ? Redness, swelling, or pain. ? Fluid or blood. ? Warmth. ? Pus or a bad smell.  Ask your health care provider if you should clean the wound with mild soap and water. Doing this may include: ? Using a clean towel to pat the wound dry after cleaning it. Do not rub or scrub the wound. ? Applying a cream or ointment. Do this only as told by your health care provider. ? Covering the incision with a clean dressing.  Ask your health care provider when you can leave the wound uncovered.  Keep the dressing dry until your health care provider says it can be removed. Do not take baths, swim, use a hot tub, or do anything that would put the wound underwater until your health care provider approves. Ask your health care provider if you can take showers. You may only be allowed to take sponge baths. Medicines   If you were prescribed an antibiotic medicine, cream, or ointment, take or use the antibiotic as told by your health care provider. Do not stop taking or using the antibiotic even if your condition improves.  Take over-the-counter and prescription medicines only as told by your health care provider. If you were prescribed pain medicine, take it 30 or more minutes before you do any wound care or as told by  your health care provider. General instructions  Return to your normal activities as told by your health care provider. Ask your health care provider what activities are safe.  Do not scratch or pick at the wound.  Do not use any products that contain nicotine or tobacco, such as cigarettes and e-cigarettes. These may delay wound healing. If you need help quitting, ask your health care provider.  Keep all follow-up visits as told by your health care provider. This is important.  Eat a diet that includes protein, vitamin A, vitamin C, and other nutrient-rich foods to help the wound heal. ? Foods rich in protein include meat, dairy, beans, nuts, and other sources. ?  Foods rich in vitamin A include carrots and dark green, leafy vegetables. ? Foods rich in vitamin C include citrus, tomatoes, and other fruits and vegetables. ? Nutrient-rich foods have protein, carbohydrates, fat, vitamins, or minerals. Eat a variety of healthy foods including vegetables, fruits, and whole grains. Contact a health care provider if:  You received a tetanus shot and you have swelling, severe pain, redness, or bleeding at the injection site.  Your pain is not controlled with medicine.  You have redness, swelling, or pain around the wound.  You have fluid or blood coming from the wound.  Your wound feels warm to the touch.  You have pus or a bad smell coming from the wound.  You have a fever or chills.  You are nauseous or you vomit.  You are dizzy. Get help right away if:  You have a red streak going away from your wound.  The edges of the wound open up and separate.  Your wound is bleeding, and the bleeding does not stop with gentle pressure.  You have a rash.  You faint.  You have trouble breathing. Summary  Always wash your hands with soap and water before changing your bandage (dressing).  To help with healing, eat foods that are rich in protein, vitamin A, vitamin C, and other nutrients.  Check your wound every day for signs of infection. Contact your health care provider if you suspect that your wound is infected. This information is not intended to replace advice given to you by your health care provider. Make sure you discuss any questions you have with your health care provider. Document Released: 09/30/2007 Document Revised: 02/01/2017 Document Reviewed: 07/08/2015 Elsevier Interactive Patient Education  2019 [place], Care After These instructions provide you with information about caring for yourself after your procedure. Your health care provider may also give you more specific instructions. Your treatment has  been planned according to current medical practices, but problems sometimes occur. Call your health care provider if you have any problems or questions after your procedure. What can I expect after the procedure? After your procedure, you may:  Feel sleepy for several hours.  Feel clumsy and have poor balance for several hours.  Feel forgetful about what happened after the procedure.  Have poor judgment for several hours.  Feel nauseous or vomit.  Have a sore throat if you had a breathing tube during the procedure. Follow these instructions at home: For at least 24 hours after the procedure:      Have a responsible adult stay with you. It is important to have someone help care for you until you are awake and alert.  Rest as needed.  Do not: ? Participate in activities in which you could fall or become injured. ? Drive. ? Use  heavy machinery. ? Drink alcohol. ? Take sleeping pills or medicines that cause drowsiness. ? Make important decisions or sign legal documents. ? Take care of children on your own. Eating and drinking  Follow the diet that is recommended by your health care provider.  If you vomit, drink water, juice, or soup when you can drink without vomiting.  Make sure you have little or no nausea before eating solid foods. General instructions  Take over-the-counter and prescription medicines only as told by your health care provider.  If you have sleep apnea, surgery and certain medicines can increase your risk for breathing problems. Follow instructions from your health care provider about wearing your sleep device: ? Anytime you are sleeping, including during daytime naps. ? While taking prescription pain medicines, sleeping medicines, or medicines that make you drowsy.  If you smoke, do not smoke without supervision.  Keep all follow-up visits as told by your health care provider. This is important. Contact a health care provider if:  You keep feeling  nauseous or you keep vomiting.  You feel light-headed.  You develop a rash.  You have a fever. Get help right away if:  You have trouble breathing. Summary  For several hours after your procedure, you may feel sleepy and have poor judgment.  Have a responsible adult stay with you for at least 24 hours or until you are awake and alert. This information is not intended to replace advice given to you by your health care provider. Make sure you discuss any questions you have with your health care provider. Document Released: 04/13/2015 Document Revised: 08/06/2016 Document Reviewed: 04/13/2015 Elsevier Interactive Patient Education  2019 [employer].

## 2018-06-29 NOTE — Telephone Encounter (Signed)
She is posted now for July 2nd she has called hospital

## 2018-07-02 LAB — NOVEL CORONAVIRUS, NAA: SARS-CoV-2, NAA: NOT DETECTED

## 2018-07-03 ENCOUNTER — Encounter (HOSPITAL_COMMUNITY): Payer: Self-pay

## 2018-07-03 ENCOUNTER — Encounter (HOSPITAL_COMMUNITY)
Admission: RE | Admit: 2018-07-03 | Discharge: 2018-07-03 | Disposition: A | Discharge status: Home / Self Care | Payer: Medicare Other | Source: Ambulatory Visit | Attending: Orthopedic Surgery | Admitting: Orthopedic Surgery

## 2018-07-03 ENCOUNTER — Other Ambulatory Visit (HOSPITAL_COMMUNITY)
Admission: RE | Admit: 2018-07-03 | Discharge: 2018-07-03 | Disposition: A | Discharge status: Home / Self Care | Payer: Medicare Other | Source: Ambulatory Visit | Attending: Orthopedic Surgery | Admitting: Orthopedic Surgery

## 2018-07-03 ENCOUNTER — Other Ambulatory Visit: Payer: Self-pay

## 2018-07-03 DIAGNOSIS — Z01810 Encounter for preprocedural cardiovascular examination: Secondary | ICD-10-CM | POA: Diagnosis not present

## 2018-07-03 DIAGNOSIS — I10 Essential (primary) hypertension: Secondary | ICD-10-CM | POA: Diagnosis not present

## 2018-07-03 DIAGNOSIS — Z96651 Presence of right artificial knee joint: Secondary | ICD-10-CM | POA: Diagnosis not present

## 2018-07-03 DIAGNOSIS — K219 Gastro-esophageal reflux disease without esophagitis: Secondary | ICD-10-CM | POA: Diagnosis not present

## 2018-07-03 DIAGNOSIS — D1721 Benign lipomatous neoplasm of skin and subcutaneous tissue of right arm: Secondary | ICD-10-CM | POA: Diagnosis not present

## 2018-07-03 DIAGNOSIS — Z7982 Long term (current) use of aspirin: Secondary | ICD-10-CM | POA: Diagnosis not present

## 2018-07-03 DIAGNOSIS — Z7983 Long term (current) use of bisphosphonates: Secondary | ICD-10-CM | POA: Diagnosis not present

## 2018-07-03 DIAGNOSIS — Z85038 Personal history of other malignant neoplasm of large intestine: Secondary | ICD-10-CM | POA: Diagnosis not present

## 2018-07-03 DIAGNOSIS — M810 Age-related osteoporosis without current pathological fracture: Secondary | ICD-10-CM | POA: Diagnosis not present

## 2018-07-03 DIAGNOSIS — Z01812 Encounter for preprocedural laboratory examination: Secondary | ICD-10-CM | POA: Diagnosis not present

## 2018-07-03 DIAGNOSIS — Z79899 Other long term (current) drug therapy: Secondary | ICD-10-CM | POA: Diagnosis not present

## 2018-07-03 DIAGNOSIS — Z1159 Encounter for screening for other viral diseases: Secondary | ICD-10-CM | POA: Diagnosis not present

## 2018-07-03 DIAGNOSIS — E785 Hyperlipidemia, unspecified: Secondary | ICD-10-CM | POA: Diagnosis not present

## 2018-07-03 DIAGNOSIS — M1990 Unspecified osteoarthritis, unspecified site: Secondary | ICD-10-CM | POA: Diagnosis not present

## 2018-07-03 LAB — CBC WITH DIFFERENTIAL/PLATELET
Abs Immature Granulocytes: 0.01 10*3/uL (ref 0.00–0.07)
Basophils Absolute: 0 10*3/uL (ref 0.0–0.1)
Basophils Relative: 1 %
Eosinophils Absolute: 0.1 10*3/uL (ref 0.0–0.5)
Eosinophils Relative: 2 %
HCT: 41.3 % (ref 36.0–46.0)
Hemoglobin: 12.9 g/dL (ref 12.0–15.0)
Immature Granulocytes: 0 %
Lymphocytes Relative: 43 %
Lymphs Abs: 2.3 10*3/uL (ref 0.7–4.0)
MCH: 28.1 pg (ref 26.0–34.0)
MCHC: 31.2 g/dL (ref 30.0–36.0)
MCV: 90 fL (ref 80.0–100.0)
Monocytes Absolute: 0.5 10*3/uL (ref 0.1–1.0)
Monocytes Relative: 10 %
Neutro Abs: 2.4 10*3/uL (ref 1.7–7.7)
Neutrophils Relative %: 44 %
Platelets: 261 10*3/uL (ref 150–400)
RBC: 4.59 MIL/uL (ref 3.87–5.11)
RDW: 14.6 % (ref 11.5–15.5)
WBC: 5.4 10*3/uL (ref 4.0–10.5)
nRBC: 0 % (ref 0.0–0.2)

## 2018-07-03 LAB — BASIC METABOLIC PANEL
Anion gap: 8 (ref 5–15)
BUN: 15 mg/dL (ref 8–23)
CO2: 24 mmol/L (ref 22–32)
Calcium: 8.8 mg/dL — ABNORMAL LOW (ref 8.9–10.3)
Chloride: 109 mmol/L (ref 98–111)
Creatinine, Ser: 0.68 mg/dL (ref 0.44–1.00)
GFR calc Af Amer: >60 mL/min (ref >60)
GFR calc non Af Amer: >60 mL/min (ref >60)
Glucose, Bld: 84 mg/dL (ref 70–99)
Potassium: 4.2 mmol/L (ref 3.5–5.1)
Sodium: 141 mmol/L (ref 135–145)

## 2018-07-04 LAB — NOVEL CORONAVIRUS, NAA (HOSP ORDER, SEND-OUT TO REF LAB; TAT 18-24 HRS): SARS-CoV-2, NAA: NOT DETECTED

## 2018-07-05 NOTE — H&P (Signed)
PREOP CONSULT/REFERRAL INTRA-OFFICE FROM DR Tyrone Apple         Chief Complaint  Patient presents with   Arm Problem      lipoma right forearm discuss surgery      85 year old female who had a knee replacement and did well presents with a mass on the proximal aspect of her right forearm on the ulnar side in the subcutaneous tissue which she says is now painful and enlarging in size.  The pain is severe enough to cause her significant discomfort when she is resting it on an armrest.  She denies any trauma the lesion has been present for several years changing in size and she has some numbness and tingling of her right upper extremity     Review of Systems  Constitutional: Negative for fever and weight loss.  Skin: Negative for rash.  Neurological: Positive for tingling and weakness.            Past Medical History:  Diagnosis Date   Arthritis     Cancer ([place])      cecal carcinoma   Colon cancer ([place]) 02/01/2012    Stage I (T2, N0, M0) cancer the cecum status post resection by Dr. Arnoldo Morale with 14 lymph nodes found all of which were negative. She had no evidence for metastatic disease on CT scan of the abdomen and pelvis either. Her date of surgery was 10/22/2011.  Not in need of chemotherapy for her stage I cancer.    GERD (gastroesophageal reflux disease)     HTN (hypertension)     Hyperlipidemia     Iron deficiency anemia 02/01/2012    At time of colon cancer presentation   Knee joint pain 2014    right   Osteoporosis            Past Surgical History:  Procedure Laterality Date   ABDOMINAL HYSTERECTOMY        partial   APPENDECTOMY        [facility]   CATARACT EXTRACTION Bilateral     CESAREAN SECTION       COLON SURGERY       dental implant       PARTIAL COLECTOMY   10/22/2011    Procedure: PARTIAL COLECTOMY;  Surgeon: Jamesetta So, MD;  Location: [facility];  Service: General;  Laterality: N/A;   TOTAL KNEE ARTHROPLASTY Right 01/30/2014    Procedure: TOTAL  KNEE ARTHROPLASTY;  Surgeon: Carole Civil, MD;  Location: [facility];  Service: Orthopedics;  Laterality: Right;          Family History  Problem Relation Age of Onset   Hypertension Mother     Cancer Sister     Breast cancer Daughter     Hypertension Daughter     Colon cancer Neg Hx     Liver disease Neg Hx     Social History        Tobacco Use   Smoking status: Never Smoker   Smokeless tobacco: Never Used  Substance Use Topics   Alcohol use: No   Drug use: No          Allergies  Allergen Reactions   Aspirin        REACTION: Upset stomach if takes regular and uncoated        Active Medications      Current Meds  Medication Sig   acetaminophen (TYLENOL) 325 MG tablet Take by mouth every 6 (six) hours as needed.   alendronate (FOSAMAX)  70 MG tablet Take 70 mg by mouth once a week. Take with a full glass of water on an empty stomach.   amLODipine (NORVASC) 5 MG tablet Take 5 mg by mouth daily.   aspirin 81 MG tablet Take 81 mg by mouth daily.   losartan (COZAAR) 100 MG tablet Take 100 mg by mouth daily.   pravastatin (PRAVACHOL) 40 MG tablet Take 40 mg by mouth every morning.        BP (!) 156/77    Pulse 94    Ht 5\' 3"  (1.6 m)    Wt 140 lb (63.5 kg)    BMI 24.80 kg/m    Physical Exam Vitals signs and nursing note reviewed.  Constitutional:      Appearance: Normal appearance.  Neurological:     Mental Status: She is alert and oriented to person, place, and time.  Psychiatric:        Mood and Affect: Mood normal.        Ortho Exam Left forearm no tenderness full range of motion no instability normal muscle tone skin is normal with no evidence of a mass neurovascular exam is intact   Right forearm there is an 8 x 6 subcutaneous lesion near the right proximal forearm on the ulnar side.  The lesion has some mild tenderness to it. Range of motion of the elbow is normal Collateral ligaments of the elbow are stable Motor exam flexion  extension normal no atrophy no tremor Skin is warm dry clean intact no erythema Axillary supraclavicular lymph nodes are normal She has some decreased sensation appears to be global right hand Color capillary refill and pulses normal radius and ulna     MEDICAL DECISION SECTION  xrays reviewed from [facility] previously taken My independent reading of xrays: Large soft tissue mass subcutaneous tissue right proximal forearm         Encounter Diagnosis  Name Primary?    Mass possible of right upper extremity ( greater than 5 cm) Yes        PLAN:  CLINICAL DATA:  Soft tissue mass of the right forearm.   EXAM: MRI OF THE RIGHT FOREARM WITHOUT CONTRAST   TECHNIQUE: Multiplanar, multisequence MR imaging of the right forearm was performed. No intravenous contrast was administered.   COMPARISON:  None.   FINDINGS: Bones/Joint/Cartilage   Normal.   Muscles and Tendons   Normal.   Soft tissues   There is a benign-appearing well-defined 6.0 X 4.3 x 2.0 cm lipoma in the subcutaneous fat of the posteromedial aspect of the forearm centered approximately 7 cm distal to the elbow joint.   There is slight edema in the subcutaneous fat adjacent to the proximal portion of the lipoma, nonspecific. The soft tissues are otherwise normal.   IMPRESSION: Benign-appearing lipoma in the subcutaneous fat of proximal right forearm. The minimal edema in the adjacent soft tissues is probably due to friction caused by the location of the mass.     Electronically Signed   By: Lorriane Shire M.D.   On: 06/23/2018 08:42   Diagnosis lipoma right forearm  Plan excision of lipoma right forearm  The procedure has been fully reviewed with the patient; The risks and benefits of surgery have been discussed and explained and understood. Alternative treatment has also been reviewed, questions were encouraged and answered. The postoperative plan is also been reviewed.

## 2018-07-06 ENCOUNTER — Ambulatory Visit (HOSPITAL_COMMUNITY)
Admission: RE | Admit: 2018-07-06 | Discharge: 2018-07-06 | Disposition: A | Discharge status: Home / Self Care | Payer: Medicare Other | Source: Ambulatory Visit | Attending: Orthopedic Surgery | Admitting: Orthopedic Surgery

## 2018-07-06 ENCOUNTER — Ambulatory Visit (HOSPITAL_COMMUNITY): Payer: Medicare Other | Admitting: Anesthesiology

## 2018-07-06 ENCOUNTER — Other Ambulatory Visit: Payer: Self-pay

## 2018-07-06 ENCOUNTER — Encounter (HOSPITAL_COMMUNITY): Payer: Self-pay | Admitting: *Deleted

## 2018-07-06 ENCOUNTER — Encounter (HOSPITAL_COMMUNITY)
Admission: RE | Disposition: A | Discharge status: Home / Self Care | Payer: Self-pay | Source: Ambulatory Visit | Attending: Orthopedic Surgery

## 2018-07-06 DIAGNOSIS — Z01812 Encounter for preprocedural laboratory examination: Secondary | ICD-10-CM | POA: Insufficient documentation

## 2018-07-06 DIAGNOSIS — D1721 Benign lipomatous neoplasm of skin and subcutaneous tissue of right arm: Secondary | ICD-10-CM | POA: Diagnosis not present

## 2018-07-06 DIAGNOSIS — Z7983 Long term (current) use of bisphosphonates: Secondary | ICD-10-CM | POA: Insufficient documentation

## 2018-07-06 DIAGNOSIS — Z85038 Personal history of other malignant neoplasm of large intestine: Secondary | ICD-10-CM | POA: Diagnosis not present

## 2018-07-06 DIAGNOSIS — Z1159 Encounter for screening for other viral diseases: Secondary | ICD-10-CM | POA: Diagnosis not present

## 2018-07-06 DIAGNOSIS — M1990 Unspecified osteoarthritis, unspecified site: Secondary | ICD-10-CM | POA: Insufficient documentation

## 2018-07-06 DIAGNOSIS — I10 Essential (primary) hypertension: Secondary | ICD-10-CM | POA: Insufficient documentation

## 2018-07-06 DIAGNOSIS — E785 Hyperlipidemia, unspecified: Secondary | ICD-10-CM | POA: Diagnosis not present

## 2018-07-06 DIAGNOSIS — Z96651 Presence of right artificial knee joint: Secondary | ICD-10-CM | POA: Diagnosis not present

## 2018-07-06 DIAGNOSIS — K219 Gastro-esophageal reflux disease without esophagitis: Secondary | ICD-10-CM | POA: Diagnosis not present

## 2018-07-06 DIAGNOSIS — Z7982 Long term (current) use of aspirin: Secondary | ICD-10-CM | POA: Insufficient documentation

## 2018-07-06 DIAGNOSIS — Z79899 Other long term (current) drug therapy: Secondary | ICD-10-CM | POA: Insufficient documentation

## 2018-07-06 DIAGNOSIS — Z01810 Encounter for preprocedural cardiovascular examination: Secondary | ICD-10-CM | POA: Diagnosis not present

## 2018-07-06 DIAGNOSIS — M810 Age-related osteoporosis without current pathological fracture: Secondary | ICD-10-CM | POA: Insufficient documentation

## 2018-07-06 HISTORY — PX: LIPOMA EXCISION: SHX5283

## 2018-07-06 SURGERY — EXCISION LIPOMA
Anesthesia: General | Site: Arm Lower | Laterality: Right

## 2018-07-06 MED ORDER — BUPIVACAINE HCL (PF) 0.5 % IJ SOLN
INTRAMUSCULAR | Status: AC
  Filled 2018-07-06: qty 30

## 2018-07-06 MED ORDER — HYDROCODONE-ACETAMINOPHEN 7.5-325 MG PO TABS: 1.0000 | ORAL_TABLET | Freq: Once | ORAL | Status: DC | PRN

## 2018-07-06 MED ORDER — LACTATED RINGERS IV SOLN
INTRAVENOUS | Status: DC
  Administered 2018-07-06: 09:00:00 via INTRAVENOUS

## 2018-07-06 MED ORDER — HYDROMORPHONE HCL 1 MG/ML IJ SOLN: 0.2500 mg | INTRAMUSCULAR | Status: DC | PRN

## 2018-07-06 MED ORDER — CEFAZOLIN SODIUM-DEXTROSE 2-4 GM/100ML-% IV SOLN
2.0000 g | INTRAVENOUS | Status: AC
  Administered 2018-07-06: 09:00:00 2 g via INTRAVENOUS

## 2018-07-06 MED ORDER — MIDAZOLAM HCL 2 MG/2ML IJ SOLN: 0.5000 mg | Freq: Once | INTRAMUSCULAR | Status: DC | PRN

## 2018-07-06 MED ORDER — PROMETHAZINE HCL 25 MG/ML IJ SOLN: 6.2500 mg | INTRAMUSCULAR | Status: DC | PRN

## 2018-07-06 MED ORDER — FENTANYL CITRATE (PF) 100 MCG/2ML IJ SOLN
INTRAMUSCULAR | Status: AC
  Filled 2018-07-06: qty 2

## 2018-07-06 MED ORDER — CHLORHEXIDINE GLUCONATE 4 % EX LIQD: 60.0000 mL | Freq: Once | CUTANEOUS | Status: DC

## 2018-07-06 MED ORDER — PROPOFOL 10 MG/ML IV BOLUS
INTRAVENOUS | Status: DC | PRN
  Administered 2018-07-06: 150 mg via INTRAVENOUS
  Administered 2018-07-06: 20 mg via INTRAVENOUS

## 2018-07-06 MED ORDER — CEFAZOLIN SODIUM-DEXTROSE 2-4 GM/100ML-% IV SOLN
INTRAVENOUS | Status: AC
  Filled 2018-07-06: qty 100

## 2018-07-06 MED ORDER — TRAMADOL HCL 50 MG PO TABS: 50.0000 mg | ORAL_TABLET | Freq: Four times a day (QID) | ORAL | 0 refills | Status: AC | PRN

## 2018-07-06 MED ORDER — BUPIVACAINE-EPINEPHRINE 0.5% -1:200000 IJ SOLN
INTRAMUSCULAR | Status: DC | PRN
  Administered 2018-07-06: 20 mL

## 2018-07-06 MED ORDER — BUPIVACAINE-EPINEPHRINE (PF) 0.5% -1:200000 IJ SOLN
INTRAMUSCULAR | Status: AC
  Filled 2018-07-06: qty 30

## 2018-07-06 MED ORDER — SODIUM CHLORIDE 0.9 % IR SOLN
Status: DC | PRN
  Administered 2018-07-06: 1000 mL

## 2018-07-06 MED ORDER — FENTANYL CITRATE (PF) 100 MCG/2ML IJ SOLN
INTRAMUSCULAR | Status: DC | PRN
  Administered 2018-07-06 (×2): 25 ug via INTRAVENOUS

## 2018-07-06 SURGICAL SUPPLY — 50 items
BANDAGE ELASTIC 3 LF NS (GAUZE/BANDAGES/DRESSINGS) ×2 IMPLANT
BANDAGE ESMARK 4X12 BL STRL LF (DISPOSABLE) ×1 IMPLANT
BNDG COHESIVE 4X5 TAN STRL (GAUZE/BANDAGES/DRESSINGS) ×2 IMPLANT
BNDG ESMARK 4X12 BLUE STRL LF (DISPOSABLE) ×3
CHLORAPREP W/TINT 26 (MISCELLANEOUS) ×3 IMPLANT
CLOSURE WOUND 1/2 X4 (GAUZE/BANDAGES/DRESSINGS) ×1
CLOTH BEACON ORANGE TIMEOUT ST (SAFETY) ×3 IMPLANT
COVER LIGHT HANDLE STERIS (MISCELLANEOUS) ×6 IMPLANT
COVER WAND RF STERILE (DRAPES) ×2 IMPLANT
CUFF TOURNIQUET SINGLE 18IN (TOURNIQUET CUFF) ×3 IMPLANT
DECANTER SPIKE VIAL GLASS SM (MISCELLANEOUS) ×3 IMPLANT
DRAPE PROXIMA HALF (DRAPES) ×3 IMPLANT
DRSG PAD ABDOMINAL 8X10 ST (GAUZE/BANDAGES/DRESSINGS) ×3 IMPLANT
ELECT REM PT RETURN 9FT ADLT (ELECTROSURGICAL) ×3
ELECTRODE REM PT RTRN 9FT ADLT (ELECTROSURGICAL) ×1 IMPLANT
GAUZE SPONGE 4X4 12PLY STRL (GAUZE/BANDAGES/DRESSINGS) ×3 IMPLANT
GAUZE XEROFORM 5X9 LF (GAUZE/BANDAGES/DRESSINGS) ×3 IMPLANT
GLOVE BIO SURGEON STRL SZ7 (GLOVE) ×2 IMPLANT
GLOVE BIOGEL PI IND STRL 7.0 (GLOVE) ×1 IMPLANT
GLOVE BIOGEL PI INDICATOR 7.0 (GLOVE) ×4
GLOVE SKINSENSE NS SZ8.0 LF (GLOVE) ×2
GLOVE SKINSENSE STRL SZ8.0 LF (GLOVE) ×1 IMPLANT
GLOVE SS N UNI LF 8.5 STRL (GLOVE) ×3 IMPLANT
GOWN STRL REUS W/ TWL LRG LVL3 (GOWN DISPOSABLE) ×2 IMPLANT
GOWN STRL REUS W/TWL LRG LVL3 (GOWN DISPOSABLE) ×2
GOWN STRL REUS W/TWL XL LVL3 (GOWN DISPOSABLE) ×3 IMPLANT
INST SET MINOR BONE (KITS) ×3 IMPLANT
KIT TURNOVER KIT A (KITS) ×3 IMPLANT
MANIFOLD NEPTUNE II (INSTRUMENTS) ×3 IMPLANT
NDL HYPO 18GX1.5 BLUNT FILL (NEEDLE) ×1 IMPLANT
NDL HYPO 21X1.5 SAFETY (NEEDLE) ×1 IMPLANT
NEEDLE HYPO 18GX1.5 BLUNT FILL (NEEDLE) ×3 IMPLANT
NEEDLE HYPO 21X1.5 SAFETY (NEEDLE) ×3 IMPLANT
NS IRRIG 1000ML POUR BTL (IV SOLUTION) ×3 IMPLANT
PACK BASIC LIMB (CUSTOM PROCEDURE TRAY) ×3 IMPLANT
PAD ABD 5X9 TENDERSORB (GAUZE/BANDAGES/DRESSINGS) ×2 IMPLANT
PAD ARMBOARD 7.5X6 YLW CONV (MISCELLANEOUS) ×3 IMPLANT
PAD CAST 4YDX4 CTTN HI CHSV (CAST SUPPLIES) ×1 IMPLANT
PADDING CAST COTTON 4X4 STRL (CAST SUPPLIES) ×2
PADDING WEBRIL 3 STERILE (GAUZE/BANDAGES/DRESSINGS) ×2 IMPLANT
SET BASIN LINEN APH (SET/KITS/TRAYS/PACK) ×3 IMPLANT
SPONGE LAP 18X18 RF (DISPOSABLE) ×3 IMPLANT
STAPLER VISISTAT 35W (STAPLE) ×2 IMPLANT
STRIP CLOSURE SKIN 1/2X4 (GAUZE/BANDAGES/DRESSINGS) ×2 IMPLANT
SUT ETHILON 3 0 FSL (SUTURE) ×2 IMPLANT
SUT MON AB 2-0 SH 27 (SUTURE) ×2
SUT MON AB 2-0 SH27 (SUTURE) ×1 IMPLANT
SYR 10ML LL (SYRINGE) ×3 IMPLANT
SYR BULB IRRIGATION 50ML (SYRINGE) ×6 IMPLANT
SYR CONTROL 10ML LL (SYRINGE) ×2 IMPLANT

## 2018-07-06 NOTE — Progress Notes (Signed)
Copy of [place] attached to surgery chart to put in patient medical record at daughter's request.

## 2018-07-06 NOTE — Brief Op Note (Signed)
07/06/2018  10:15 AM  PATIENT:  Sandra Williams  85 y.o. female  PRE-OPERATIVE DIAGNOSIS:  lipoma right forearm  POST-OPERATIVE DIAGNOSIS:  lipoma right forearm  PROCEDURE:  Procedure(s): EXCISION LIPOMA RIGHT FOREARM (Right)  FINDINGS: 4 cm x 5 cm lipoma  SURGEON:  Surgeon(s) and Role:    Carole Civil, MD - Primary  Details of the surgical procedure Preop: The patient was identified properly the chart was reviewed and consent was updated site was marked right arm.  She was then taken to the surgical suite  In the surgical suite: She had general anesthesia.  She had 2 g of Ancef.  She was placed in a supine position.  The arm was placed on an arm table.  A tourniquet was used.  After sterile prep and drape timeout was completed.  With the arm at 90 degrees of flexion and propped up on several towels a straight incision was made over the lesion it was divided sharply the lesion was immediately encountered and with finger dissection it was removed from the overlying fascia.  This was a subcutaneous lesion.  The size is noted above.  The lesion was submitted in 2 specimens.  After thorough irrigation 2-0 Monocryl sutures and staples were used to close the skin.  20 cc of Marcaine with epinephrine was used for postoperative anesthesia  Sterile dressing was applied using Xeroform 4 x 4's ABD and then Kerlix followed by a 3 inch and 4 inch Ace bandage.  She was taken to recovery room after extubation in stable condition   PHYSICIAN ASSISTANT:   ASSISTANTS: none   ANESTHESIA:   general EBL:  none   BLOOD ADMINISTERED:none  DRAINS: none   LOCAL MEDICATIONS USED:  MARCAINE     SPECIMEN: 4 x 5 cm mass gross description: adipose tissue, yellow lobulated, above the fascia  DISPOSITION OF SPECIMEN:  N/A  COUNTS:  YES  TOURNIQUET:   See anesthesia record   DICTATION: .Dragon Dictation  PLAN OF CARE: Discharge to home after PACU  PATIENT DISPOSITION:  PACU -  hemodynamically stable.   Delay start of Pharmacological VTE agent (>24hrs) due to surgical blood loss or risk of bleeding: not applicable

## 2018-07-06 NOTE — Anesthesia Preprocedure Evaluation (Signed)
Anesthesia Evaluation  Patient identified by MRN, date of birth, ID band Patient awake  General Assessment Comment:States no issues with anesthesia  Reports sister had respiratory issues once -   Reviewed: Allergy & Precautions, NPO status , Patient's Chart, lab work & pertinent test results  History of Anesthesia Complications (+) Family history of anesthesia reaction  Airway Mallampati: II  TM Distance: >3 FB Neck ROM: Full    Dental no notable dental hx. (+) Poor Dentition 3 lower teeth -states need to be pulled  :   Pulmonary neg pulmonary ROS,    Pulmonary exam normal breath sounds clear to auscultation       Cardiovascular Exercise Tolerance: Good hypertension, Pt. on medications negative cardio ROS Normal cardiovascular examI Rhythm:Regular Rate:Normal  Report able to walk 4 blocks with cane  Denies CP/MI/SOB/DOE   Neuro/Psych  Neuromuscular disease negative psych ROS   GI/Hepatic Neg liver ROS, GERD  ,Denies any GERD today  States anxious  Chart states h/o GERD -denies current meds or Sx today   Endo/Other  negative endocrine ROS  Renal/GU Renal InsufficiencyRenal disease  negative genitourinary   Musculoskeletal  (+) Arthritis ,   Abdominal   Peds negative pediatric ROS (+)  Hematology negative hematology ROS (+) anemia ,   Anesthesia Other Findings   Reproductive/Obstetrics negative OB ROS                             Anesthesia Physical Anesthesia Plan  ASA: III  Anesthesia Plan: General   Post-op Pain Management:    Induction: Intravenous  PONV Risk Score and Plan: 3 and Treatment may vary due to age or medical condition, Ondansetron and Dexamethasone  Airway Management Planned: LMA  Additional Equipment:   Intra-op Plan:   Post-operative Plan:   Informed Consent: I have reviewed the patients History and Physical, chart, labs and discussed the procedure  including the risks, benefits and alternatives for the proposed anesthesia with the patient or authorized representative who has indicated his/her understanding and acceptance.     Dental advisory given  Plan Discussed with: CRNA  Anesthesia Plan Comments: (Plan Full PPE use  Plan GA (LMA) vs GETA as needed -WTP with same after Q&A)        Anesthesia Quick Evaluation

## 2018-07-06 NOTE — Discharge Instructions (Signed)
Keep dressing on until you see the doctor is okay to move the elbow as tolerated       Monitored Anesthesia Care, Care After These instructions provide you with information about caring for yourself after your procedure. Your health care provider may also give you more specific instructions. Your treatment has been planned according to current medical practices, but problems sometimes occur. Call your health care provider if you have any problems or questions after your procedure. What can I expect after the procedure? After your procedure, you may:  Feel sleepy for several hours.  Feel clumsy and have poor balance for several hours.  Feel forgetful about what happened after the procedure.  Have poor judgment for several hours.  Feel nauseous or vomit.  Have a sore throat if you had a breathing tube during the procedure. Follow these instructions at home: For at least 24 hours after the procedure:      Have a responsible adult stay with you. It is important to have someone help care for you until you are awake and alert.  Rest as needed.  Do not: ? Participate in activities in which you could fall or become injured. ? Drive. ? Use heavy machinery. ? Drink alcohol. ? Take sleeping pills or medicines that cause drowsiness. ? Make important decisions or sign legal documents. ? Take care of children on your own. Eating and drinking  Follow the diet that is recommended by your health care provider.  If you vomit, drink water, juice, or soup when you can drink without vomiting.  Make sure you have little or no nausea before eating solid foods. General instructions  Take over-the-counter and prescription medicines only as told by your health care provider.  If you have sleep apnea, surgery and certain medicines can increase your risk for breathing problems. Follow instructions from your health care provider about wearing your sleep device: ? Anytime you are sleeping,  including during daytime naps. ? While taking prescription pain medicines, sleeping medicines, or medicines that make you drowsy.  If you smoke, do not smoke without supervision.  Keep all follow-up visits as told by your health care provider. This is important. Contact a health care provider if:  You keep feeling nauseous or you keep vomiting.  You feel light-headed.  You develop a rash.  You have a fever. Get help right away if:  You have trouble breathing. Summary  For several hours after your procedure, you may feel sleepy and have poor judgment.  Have a responsible adult stay with you for at least 24 hours or until you are awake and alert. This information is not intended to replace advice given to you by your health care provider. Make sure you discuss any questions you have with your health care provider. Document Released: 04/13/2015 Document Revised: 03/21/2017 Document Reviewed: 04/13/2015 Elsevier Patient Education  2020 [employer].

## 2018-07-06 NOTE — Op Note (Signed)
07/06/2018  10:15 AM  PATIENT:  Sandra Williams  85 y.o. female  PRE-OPERATIVE DIAGNOSIS:  lipoma right forearm  POST-OPERATIVE DIAGNOSIS:  lipoma right forearm  PROCEDURE:  Procedure(s): EXCISION LIPOMA RIGHT FOREARM (Right) - 15379 (>3cm)  FINDINGS: 4 cm x 5 cm lipoma  SURGEON:  Surgeon(s) and Role:    Carole Civil, MD - Primary  Details of the surgical procedure Preop: The patient was identified properly the chart was reviewed and consent was updated site was marked right arm.  She was then taken to the surgical suite  In the surgical suite: She had general anesthesia.  She had 2 g of Ancef.  She was placed in a supine position.  The arm was placed on an arm table.  A tourniquet was used.  After sterile prep and drape timeout was completed.  With the arm at 90 degrees of flexion and propped up on several towels a straight incision was made over the lesion it was divided sharply the lesion was immediately encountered and with finger dissection it was removed from the overlying fascia.  This was a subcutaneous lesion.  The size is noted above.  The lesion was submitted in 2 specimens.  After thorough irrigation 2-0 Monocryl sutures and staples were used to close the skin.  20 cc of Marcaine with epinephrine was used for postoperative anesthesia  Sterile dressing was applied using Xeroform 4 x 4's ABD and then Kerlix followed by a 3 inch and 4 inch Ace bandage.  She was taken to recovery room after extubation in stable condition   PHYSICIAN ASSISTANT:   ASSISTANTS: none   ANESTHESIA:   general EBL:  none   BLOOD ADMINISTERED:none  DRAINS: none   LOCAL MEDICATIONS USED:  MARCAINE     SPECIMEN: 4 x 5 cm mass gross description: adipose tissue, yellow lobulated, above the fascia  DISPOSITION OF SPECIMEN:  N/A  COUNTS:  YES  TOURNIQUET:   See anesthesia record   DICTATION: .Dragon Dictation  PLAN OF CARE: Discharge to home after PACU  PATIENT  DISPOSITION:  PACU - hemodynamically stable.   Delay start of Pharmacological VTE agent (>24hrs) due to surgical blood loss or risk of bleeding: not applicable

## 2018-07-06 NOTE — Anesthesia Postprocedure Evaluation (Signed)
Anesthesia Post Note  Patient: Sandra Williams  Procedure(s) Performed: EXCISION LIPOMA RIGHT FOREARM (Right Arm Lower)  Patient location during evaluation: PACU Anesthesia Type: General Level of consciousness: awake and alert and patient cooperative Pain management: pain level controlled Vital Signs Assessment: post-procedure vital signs reviewed and stable Respiratory status: spontaneous breathing, respiratory function stable and nonlabored ventilation Cardiovascular status: blood pressure returned to baseline Postop Assessment: no apparent nausea or vomiting Anesthetic complications: no     Last Vitals:  Vitals:   07/06/18 1018  BP: 135/60  Pulse: 72  Resp: 13  Temp: 36.8 C  SpO2: 96%    Last Pain:  Vitals:   07/06/18 1018  PainSc: 0-No pain                 Osbaldo Mark J

## 2018-07-06 NOTE — Anesthesia Procedure Notes (Signed)
Procedure Name: LMA Insertion Date/Time: 07/06/2018 9:22 AM Performed by: Charmaine Downs, CRNA Pre-anesthesia Checklist: Patient identified, Patient being monitored, Emergency Drugs available, Timeout performed and Suction available Patient Re-evaluated:Patient Re-evaluated prior to induction Oxygen Delivery Method: Circle System Utilized Preoxygenation: Pre-oxygenation with 100% oxygen Induction Type: IV induction Ventilation: Mask ventilation without difficulty LMA: LMA inserted LMA Size: 4.0 Number of attempts: 1 Placement Confirmation: positive ETCO2 and breath sounds checked- equal and bilateral Tube secured with: Tape Dental Injury: Teeth and Oropharynx as per pre-operative assessment

## 2018-07-06 NOTE — Addendum Note (Signed)
Addendum  created 07/06/18 1321 by Ollen Bowl, CRNA   Intraprocedure Flowsheets edited

## 2018-07-06 NOTE — Interval H&P Note (Signed)
History and Physical Interval Note:  07/06/2018 9:00 AM  Sandra Williams  has presented today for surgery, with the diagnosis of lipoma right forearm.  The various methods of treatment have been discussed with the patient and family. After consideration of risks, benefits and other options for treatment, the patient has consented to  Procedure(s): EXCISION LIPOMA right forearm (Right) as a surgical intervention.  The patient's history has been reviewed, patient examined, no change in status, stable for surgery.  I have reviewed the patient's chart and labs.  Questions were answered to the patient's satisfaction.     Arther Abbott

## 2018-07-06 NOTE — Transfer of Care (Signed)
Immediate Anesthesia Transfer of Care Note  Patient: Sandra Williams  Procedure(s) Performed: EXCISION LIPOMA RIGHT FOREARM (Right Arm Lower)  Patient Location: PACU  Anesthesia Type:General  Level of Consciousness: awake and patient cooperative  Airway & Oxygen Therapy: Patient Spontanous Breathing and Patient connected to nasal cannula oxygen  Post-op Assessment: Report given to RN, Post -op Vital signs reviewed and stable and Patient moving all extremities  Post vital signs: Reviewed and stable  Last Vitals:  Vitals Value Taken Time  BP    Temp    Pulse 76 07/06/18 1017  Resp 14 07/06/18 1017  SpO2 97 % 07/06/18 1017  Vitals shown include unvalidated device data.  Last Pain: There were no vitals filed for this visit.       Complications: No apparent anesthesia complications

## 2018-07-10 ENCOUNTER — Encounter (HOSPITAL_COMMUNITY): Payer: Self-pay | Admitting: Orthopedic Surgery

## 2018-07-14 ENCOUNTER — Other Ambulatory Visit: Payer: Self-pay

## 2018-07-14 ENCOUNTER — Ambulatory Visit (INDEPENDENT_AMBULATORY_CARE_PROVIDER_SITE_OTHER): Payer: Medicare Other | Admitting: Orthopedic Surgery

## 2018-07-14 ENCOUNTER — Encounter: Payer: Self-pay | Admitting: Orthopedic Surgery

## 2018-07-14 DIAGNOSIS — D1721 Benign lipomatous neoplasm of skin and subcutaneous tissue of right arm: Secondary | ICD-10-CM

## 2018-07-14 NOTE — Progress Notes (Signed)
Chief Complaint  Patient presents with  . Routine Post Op    right forearm lipoma 07/06/2018   Path report benign lesion lipoma  Wound check: normal wound   Neuro-vascular intact.  Fu 1 weeks staples out   Encounter Diagnosis  Name Primary?  . Lipoma of right forearm s/p excision 07/06/2018 Yes

## 2018-07-21 ENCOUNTER — Other Ambulatory Visit: Payer: Self-pay

## 2018-07-21 ENCOUNTER — Encounter: Payer: Self-pay | Admitting: Orthopedic Surgery

## 2018-07-21 ENCOUNTER — Ambulatory Visit (INDEPENDENT_AMBULATORY_CARE_PROVIDER_SITE_OTHER): Payer: Medicare Other | Admitting: Orthopedic Surgery

## 2018-07-21 VITALS — Temp 98.6°F

## 2018-07-21 DIAGNOSIS — D1721 Benign lipomatous neoplasm of skin and subcutaneous tissue of right arm: Secondary | ICD-10-CM

## 2018-07-21 NOTE — Progress Notes (Signed)
Postop lipoma excision right forearm came in for staple removal.  Staples extracted wound looks fine neurovascular exam is intact patient was released  Encounter Diagnosis  Name Primary?  . Lipoma of right forearm s/p excision 07/06/2018 Yes

## 2018-08-14 DIAGNOSIS — M810 Age-related osteoporosis without current pathological fracture: Secondary | ICD-10-CM | POA: Diagnosis not present

## 2018-08-14 DIAGNOSIS — Z1331 Encounter for screening for depression: Secondary | ICD-10-CM | POA: Diagnosis not present

## 2018-08-14 DIAGNOSIS — I10 Essential (primary) hypertension: Secondary | ICD-10-CM | POA: Diagnosis not present

## 2018-08-14 DIAGNOSIS — Z1389 Encounter for screening for other disorder: Secondary | ICD-10-CM | POA: Diagnosis not present

## 2018-08-14 DIAGNOSIS — E785 Hyperlipidemia, unspecified: Secondary | ICD-10-CM | POA: Diagnosis not present

## 2018-08-14 DIAGNOSIS — I2510 Atherosclerotic heart disease of native coronary artery without angina pectoris: Secondary | ICD-10-CM | POA: Diagnosis not present

## 2018-08-15 DIAGNOSIS — I10 Essential (primary) hypertension: Secondary | ICD-10-CM | POA: Diagnosis not present

## 2018-08-15 DIAGNOSIS — Z0001 Encounter for general adult medical examination with abnormal findings: Secondary | ICD-10-CM | POA: Diagnosis not present

## 2018-08-15 DIAGNOSIS — E7849 Other hyperlipidemia: Secondary | ICD-10-CM | POA: Diagnosis not present

## 2018-09-14 DIAGNOSIS — I10 Essential (primary) hypertension: Secondary | ICD-10-CM | POA: Diagnosis not present

## 2018-09-14 DIAGNOSIS — I2510 Atherosclerotic heart disease of native coronary artery without angina pectoris: Secondary | ICD-10-CM | POA: Diagnosis not present

## 2018-10-14 DIAGNOSIS — K219 Gastro-esophageal reflux disease without esophagitis: Secondary | ICD-10-CM | POA: Diagnosis not present

## 2018-10-14 DIAGNOSIS — I2510 Atherosclerotic heart disease of native coronary artery without angina pectoris: Secondary | ICD-10-CM | POA: Diagnosis not present

## 2018-10-25 ENCOUNTER — Ambulatory Visit (INDEPENDENT_AMBULATORY_CARE_PROVIDER_SITE_OTHER): Payer: Medicare Other | Admitting: Orthopedic Surgery

## 2018-10-25 ENCOUNTER — Other Ambulatory Visit: Payer: Self-pay

## 2018-10-25 ENCOUNTER — Encounter: Payer: Self-pay | Admitting: Orthopedic Surgery

## 2018-10-25 VITALS — BP 126/67 | HR 85 | Temp 98.1°F | Ht 60.0 in | Wt 136.0 lb

## 2018-10-25 DIAGNOSIS — G5601 Carpal tunnel syndrome, right upper limb: Secondary | ICD-10-CM

## 2018-10-25 MED ORDER — IBUPROFEN 400 MG PO TABS: 400.0000 mg | ORAL_TABLET | Freq: Every day | ORAL | 0 refills | Status: DC

## 2018-10-25 NOTE — Patient Instructions (Signed)
Carpal Tunnel Syndrome  Carpal tunnel syndrome is a condition that causes pain in your hand and arm. The carpal tunnel is a narrow area that is on the palm side of your wrist. Repeated wrist motion or certain diseases may cause swelling in the tunnel. This swelling can pinch the main nerve in the wrist (median nerve). What are the causes? This condition may be caused by:  Repeated wrist motions.  Wrist injuries.  Arthritis.  A sac of fluid (cyst) or abnormal growth (tumor) in the carpal tunnel.  Fluid buildup during pregnancy. Sometimes the cause is not known. What increases the risk? The following factors may make you more likely to develop this condition:  Having a job in which you move your wrist in the same way many times. This includes jobs like being a butcher or a cashier.  Being a woman.  Having other health conditions, such as: ? Diabetes. ? Obesity. ? A thyroid gland that is not active enough (hypothyroidism). ? Kidney failure. What are the signs or symptoms? Symptoms of this condition include:  A tingling feeling in your fingers.  Tingling or a loss of feeling (numbness) in your hand.  Pain in your entire arm. This pain may get worse when you bend your wrist and elbow for a long time.  Pain in your wrist that goes up your arm to your shoulder.  Pain that goes down into your palm or fingers.  A weak feeling in your hands. You may find it hard to grab and hold items. You may feel worse at night. How is this diagnosed? This condition is diagnosed with a medical history and physical exam. You may also have tests, such as:  Electromyogram (EMG). This test checks the signals that the nerves send to the muscles.  Nerve conduction study. This test checks how well signals pass through your nerves.  Imaging tests, such as X-rays, ultrasound, and MRI. These tests check for what might be the cause of your condition. How is this treated? This condition may be treated  with:  Lifestyle changes. You will be asked to stop or change the activity that caused your problem.  Doing exercise and activities that make bones and muscles stronger (physical therapy).  Learning how to use your hand again (occupational therapy).  Medicines for pain and swelling (inflammation). You may have injections in your wrist.  A wrist splint.  Surgery. Follow these instructions at home: If you have a splint:  Wear the splint as told by your doctor. Remove it only as told by your doctor.  Loosen the splint if your fingers: ? Tingle. ? Lose feeling (become numb). ? Turn cold and blue.  Keep the splint clean.  If the splint is not waterproof: ? Do not let it get wet. ? Cover it with a watertight covering when you take a bath or a shower. Managing pain, stiffness, and swelling   If told, put ice on the painful area: ? If you have a removable splint, remove it as told by your doctor. ? Put ice in a plastic bag. ? Place a towel between your skin and the bag. ? Leave the ice on for 20 minutes, 2-3 times per day. General instructions  Take over-the-counter and prescription medicines only as told by your doctor.  Rest your wrist from any activity that may cause pain. If needed, talk with your boss at work about changes that can help your wrist heal.  Do any exercises as told by your doctor,   physical therapist, or occupational therapist.  Keep all follow-up visits as told by your doctor. This is important. Contact a doctor if:  You have new symptoms.  Medicine does not help your pain.  Your symptoms get worse. Get help right away if:  You have very bad numbness or tingling in your wrist or hand. Summary  Carpal tunnel syndrome is a condition that causes pain in your hand and arm.  It is often caused by repeated wrist motions.  Lifestyle changes and medicines are used to treat this problem. Surgery may help in very bad cases.  Follow your doctor's  instructions about wearing a splint, resting your wrist, keeping follow-up visits, and calling for help. This information is not intended to replace advice given to you by your health care provider. Make sure you discuss any questions you have with your health care provider. Document Released: 12/10/2010 Document Revised: 04/29/2017 Document Reviewed: 04/29/2017 Elsevier Patient Education  2020 [employer]  

## 2018-10-25 NOTE — Progress Notes (Signed)
NEW PROBLEM OFFICE VISIT  Chief Complaint  Patient presents with  . Follow-up    Recheck on right forearm, DOS 07-06-18.    Sandra Williams is a delightful 85 year old female who had a mass removed from her right arm back in July 2020 comes in today with numbness and tingling of the right hand for about a month no history of trauma she reports weakness in grip and difficulty with fine motor tasks no prior treatment minimal pain primarily if the numbness and no weakness of the hand that are bothering her   Review of Systems  Constitutional: Positive for malaise/fatigue. Negative for chills and fever.  Neurological: Positive for tingling, sensory change and weakness.     Past Medical History:  Diagnosis Date  . Arthritis   . Cancer ([place])    cecal carcinoma  . Colon cancer ([place]) 02/01/2012   Stage I (T2, N0, M0) cancer the cecum status post resection by Dr. Arnoldo Morale with 14 lymph nodes found all of which were negative. She had no evidence for metastatic disease on CT scan of the abdomen and pelvis either. Her date of surgery was 10/22/2011.  Not in need of chemotherapy for her stage I cancer.   Marland Kitchen GERD (gastroesophageal reflux disease)   . HTN (hypertension)   . Hyperlipidemia   . Iron deficiency anemia 02/01/2012   At time of colon cancer presentation  . Knee joint pain 2014   right  . Osteoporosis     Past Surgical History:  Procedure Laterality Date  . ABDOMINAL HYSTERECTOMY     partial  . APPENDECTOMY     [facility]  . CATARACT EXTRACTION Bilateral   . CESAREAN SECTION    . COLON SURGERY    . dental implant    . LIPOMA EXCISION Right 07/06/2018   Procedure: EXCISION LIPOMA RIGHT FOREARM;  Surgeon: Carole Civil, MD;  Location: [facility];  Service: Orthopedics;  Laterality: Right;  . PARTIAL COLECTOMY  10/22/2011   Procedure: PARTIAL COLECTOMY;  Surgeon: Jamesetta So, MD;  Location: [facility];  Service: General;  Laterality: N/A;  . TOTAL KNEE ARTHROPLASTY Right 01/30/2014   Procedure: TOTAL KNEE ARTHROPLASTY;  Surgeon: Carole Civil, MD;  Location: [facility];  Service: Orthopedics;  Laterality: Right;    Family History  Problem Relation Age of Onset  . Hypertension Mother   . Cancer Sister   . Breast cancer Daughter   . Hypertension Daughter   . Colon cancer Neg Hx   . Liver disease Neg Hx    Social History   Tobacco Use  . Smoking status: Never Smoker  . Smokeless tobacco: Never Used  Substance Use Topics  . Alcohol use: No  . Drug use: No    Allergies  Allergen Reactions  . Aspirin     REACTION: Upset stomach if takes regular and uncoated     Current Meds  Medication Sig  . alendronate (FOSAMAX) 70 MG tablet Take 70 mg by mouth every Saturday. Take with a full glass of water on an empty stomach.   Marland Kitchen amLODipine (NORVASC) 5 MG tablet Take 5 mg by mouth daily.  Marland Kitchen aspirin 81 MG EC tablet Take 81 mg by mouth daily.   . hydroxypropyl methylcellulose / hypromellose (ISOPTO TEARS / GONIOVISC) 2.5 % ophthalmic solution Place 1 drop into both eyes 3 (three) times daily as needed for dry eyes (irritation).  Marland Kitchen losartan (COZAAR) 100 MG tablet Take 100 mg by mouth daily.  . Multiple Vitamin (MULTIVITAMIN WITH MINERALS)  TABS tablet Take 1 tablet by mouth daily.  . pravastatin (PRAVACHOL) 40 MG tablet Take 40 mg by mouth every morning.   . traMADol (ULTRAM) 50 MG tablet Take 1 tablet (50 mg total) by mouth every 6 (six) hours as needed.    BP 126/67   Pulse 85   Temp 98.1 F (36.7 C)   Ht 5' (1.524 m)   Wt 136 lb (61.7 kg)   BMI 26.56 kg/m   Physical Exam Well-developed well-nourished  Appearance normal she is oriented x3 mood pleasant affect normal  Walks with dependent gait   Ortho Exam  Left upper extremity skin is normal there is no tenderness range of motion is normal in the elbow and hand both joints are stable muscle tone is normal  Right upper extremity decreased sensation in the right hand no tenderness in the lateral forearm  mild tenderness medial forearm skin is intact good in prior incision which did not appear to be involved joints are stable muscle tone is normal grip strength is a little weak she has some sensory loss in the fingertips globally in the hand  [place] were done at No  My independent reading of xrays:  Not applicable  Encounter Diagnosis  Name Primary?  . Carpal tunnel syndrome of right wrist Yes    PLAN: (Rx., injectx, surgery, frx, mri/ct) Recommend splinting and Advil 400 mg at night  No orders of the defined types were placed in this encounter.   Arther Abbott, MD  10/25/2018 3:09 PM

## 2018-11-29 ENCOUNTER — Ambulatory Visit (INDEPENDENT_AMBULATORY_CARE_PROVIDER_SITE_OTHER): Payer: Medicare Other | Admitting: Orthopedic Surgery

## 2018-11-29 ENCOUNTER — Other Ambulatory Visit: Payer: Self-pay

## 2018-11-29 VITALS — BP 134/84 | HR 99 | Temp 98.1°F | Ht 61.0 in | Wt 136.0 lb

## 2018-11-29 DIAGNOSIS — G5601 Carpal tunnel syndrome, right upper limb: Secondary | ICD-10-CM

## 2018-11-29 NOTE — Progress Notes (Signed)
Encounter Diagnosis  Name Primary?  . Carpal tunnel syndrome of right wrist Yes    Chief Complaint  Patient presents with  . Follow-up    5 week recheck on right CTS    Sandra Williams is doing well with splinting and Advil 400 mg at night for her right carpal tunnel syndrome.  Although she still has some symptoms she says they are very tolerable and she can do her activities of daily living  She has full range of motion of the hand and wrist she has some decreased sensation at the fingertips of the right hand in the median nerve distribution  Impression Encounter Diagnosis  Name Primary?  . Carpal tunnel syndrome of right wrist Yes    Continue current treatment of brace and 400 mg of ibuprofen at night follow-up as needed

## 2018-11-29 NOTE — Patient Instructions (Signed)
Continue splinting with the brace and continue Advil 400 mg at night

## 2019-01-08 DIAGNOSIS — I10 Essential (primary) hypertension: Secondary | ICD-10-CM | POA: Diagnosis not present

## 2019-01-08 DIAGNOSIS — E785 Hyperlipidemia, unspecified: Secondary | ICD-10-CM | POA: Diagnosis not present

## 2019-01-08 DIAGNOSIS — M810 Age-related osteoporosis without current pathological fracture: Secondary | ICD-10-CM | POA: Diagnosis not present

## 2019-01-19 ENCOUNTER — Ambulatory Visit: Payer: Medicare Other | Attending: Internal Medicine

## 2019-01-19 ENCOUNTER — Other Ambulatory Visit: Payer: Self-pay

## 2019-01-19 DIAGNOSIS — Z20822 Contact with and (suspected) exposure to covid-19: Secondary | ICD-10-CM

## 2019-01-20 LAB — NOVEL CORONAVIRUS, NAA: SARS-CoV-2, NAA: NOT DETECTED

## 2019-03-03 DIAGNOSIS — K219 Gastro-esophageal reflux disease without esophagitis: Secondary | ICD-10-CM | POA: Diagnosis not present

## 2019-03-03 DIAGNOSIS — I2510 Atherosclerotic heart disease of native coronary artery without angina pectoris: Secondary | ICD-10-CM | POA: Diagnosis not present

## 2019-03-29 DIAGNOSIS — E785 Hyperlipidemia, unspecified: Secondary | ICD-10-CM | POA: Diagnosis not present

## 2019-03-29 DIAGNOSIS — I2510 Atherosclerotic heart disease of native coronary artery without angina pectoris: Secondary | ICD-10-CM | POA: Diagnosis not present

## 2019-03-29 DIAGNOSIS — D509 Iron deficiency anemia, unspecified: Secondary | ICD-10-CM | POA: Diagnosis not present

## 2019-03-29 DIAGNOSIS — R42 Dizziness and giddiness: Secondary | ICD-10-CM | POA: Diagnosis not present

## 2019-03-29 DIAGNOSIS — I10 Essential (primary) hypertension: Secondary | ICD-10-CM | POA: Diagnosis not present

## 2019-05-01 DIAGNOSIS — I2510 Atherosclerotic heart disease of native coronary artery without angina pectoris: Secondary | ICD-10-CM | POA: Diagnosis not present

## 2019-05-01 DIAGNOSIS — K219 Gastro-esophageal reflux disease without esophagitis: Secondary | ICD-10-CM | POA: Diagnosis not present

## 2019-05-03 ENCOUNTER — Ambulatory Visit: Payer: Medicare Other | Admitting: Family Medicine

## 2019-05-15 DIAGNOSIS — I2510 Atherosclerotic heart disease of native coronary artery without angina pectoris: Secondary | ICD-10-CM | POA: Diagnosis not present

## 2019-05-15 DIAGNOSIS — M810 Age-related osteoporosis without current pathological fracture: Secondary | ICD-10-CM | POA: Diagnosis not present

## 2019-05-15 DIAGNOSIS — I10 Essential (primary) hypertension: Secondary | ICD-10-CM | POA: Diagnosis not present

## 2019-06-15 DIAGNOSIS — K219 Gastro-esophageal reflux disease without esophagitis: Secondary | ICD-10-CM | POA: Diagnosis not present

## 2019-06-15 DIAGNOSIS — I2510 Atherosclerotic heart disease of native coronary artery without angina pectoris: Secondary | ICD-10-CM | POA: Diagnosis not present

## 2019-07-15 DIAGNOSIS — I2510 Atherosclerotic heart disease of native coronary artery without angina pectoris: Secondary | ICD-10-CM | POA: Diagnosis not present

## 2019-07-15 DIAGNOSIS — K219 Gastro-esophageal reflux disease without esophagitis: Secondary | ICD-10-CM | POA: Diagnosis not present

## 2019-08-15 DIAGNOSIS — K219 Gastro-esophageal reflux disease without esophagitis: Secondary | ICD-10-CM | POA: Diagnosis not present

## 2019-08-15 DIAGNOSIS — I2510 Atherosclerotic heart disease of native coronary artery without angina pectoris: Secondary | ICD-10-CM | POA: Diagnosis not present

## 2019-09-01 ENCOUNTER — Other Ambulatory Visit: Payer: Self-pay

## 2019-09-01 ENCOUNTER — Ambulatory Visit
Admission: EM | Admit: 2019-09-01 | Discharge: 2019-09-01 | Disposition: A | Discharge status: Home / Self Care | Payer: Medicare Other | Attending: Emergency Medicine | Admitting: Emergency Medicine

## 2019-09-01 ENCOUNTER — Encounter: Payer: Self-pay | Admitting: Emergency Medicine

## 2019-09-01 DIAGNOSIS — N3001 Acute cystitis with hematuria: Secondary | ICD-10-CM | POA: Diagnosis not present

## 2019-09-01 LAB — POCT URINALYSIS DIP (MANUAL ENTRY)
Bilirubin, UA: NEGATIVE
Glucose, UA: NEGATIVE mg/dL
Ketones, POC UA: NEGATIVE mg/dL
Nitrite, UA: NEGATIVE
Protein Ur, POC: 30 mg/dL — AB
Spec Grav, UA: <=1.005 — AB (ref 1.010–1.025)
Urobilinogen, UA: 0.2 E.U./dL
pH, UA: 6 (ref 5.0–8.0)

## 2019-09-01 MED ORDER — CEPHALEXIN 500 MG PO CAPS: 500.0000 mg | ORAL_CAPSULE | Freq: Three times a day (TID) | ORAL | 0 refills | Status: AC

## 2019-09-01 NOTE — ED Triage Notes (Signed)
Blood in urine x 2 days, reports feeling weak.

## 2019-09-01 NOTE — ED Provider Notes (Signed)
[facility]   CC: Burning with urination  SUBJECTIVE:  Grisel Blumenstock is a 86 y.o. female who presents to the urgent care for complaint of hematuria for the past 2 days.  Patient denies a precipitating event, recent sexual encounter, excessive caffeine intake.    Has tried OTC medications without relief.  Symptoms are made worse with urination.  Admits to similar symptoms in the past.  Denies fever, chills, nausea, vomiting, abdominal pain, flank pain, abnormal vaginal discharge or bleeding, hematuria.    LMP: No LMP recorded. Patient has had a hysterectomy.  ROS: As in HPI.  All other pertinent ROS negative.     Past Medical History:  Diagnosis Date   Arthritis    Cancer ([place])    cecal carcinoma   Colon cancer ([place]) 02/01/2012   Stage I (T2, N0, M0) cancer the cecum status post resection by Dr. Arnoldo Morale with 14 lymph nodes found all of which were negative. She had no evidence for metastatic disease on CT scan of the abdomen and pelvis either. Her date of surgery was 10/22/2011.  Not in need of chemotherapy for her stage I cancer.    GERD (gastroesophageal reflux disease)    HTN (hypertension)    Hyperlipidemia    Iron deficiency anemia 02/01/2012   At time of colon cancer presentation   Knee joint pain 2014   right   Osteoporosis    Past Surgical History:  Procedure Laterality Date   ABDOMINAL HYSTERECTOMY     partial   APPENDECTOMY     [facility]   CATARACT EXTRACTION Bilateral    CESAREAN SECTION     COLON SURGERY     dental implant     LIPOMA EXCISION Right 07/06/2018   Procedure: EXCISION LIPOMA RIGHT FOREARM;  Surgeon: Carole Civil, MD;  Location: [facility];  Service: Orthopedics;  Laterality: Right;   PARTIAL COLECTOMY  10/22/2011   Procedure: PARTIAL COLECTOMY;  Surgeon: Jamesetta So, MD;  Location: [facility];  Service: General;  Laterality: N/A;   TOTAL KNEE ARTHROPLASTY Right 01/30/2014   Procedure: TOTAL KNEE ARTHROPLASTY;  Surgeon:  Carole Civil, MD;  Location: [facility];  Service: Orthopedics;  Laterality: Right;   Allergies  Allergen Reactions   Aspirin     REACTION: Upset stomach if takes regular and uncoated    No current facility-administered medications on file prior to encounter.   Current Outpatient Medications on File Prior to Encounter  Medication Sig Dispense Refill   alendronate (FOSAMAX) 70 MG tablet Take 70 mg by mouth every Saturday. Take with a full glass of water on an empty stomach.      amLODipine (NORVASC) 5 MG tablet Take 5 mg by mouth daily.  3   aspirin 81 MG EC tablet Take 81 mg by mouth daily.      hydroxypropyl methylcellulose / hypromellose (ISOPTO TEARS / GONIOVISC) 2.5 % ophthalmic solution Place 1 drop into both eyes 3 (three) times daily as needed for dry eyes (irritation).     ibuprofen (ADVIL) 400 MG tablet Take 1 tablet (400 mg total) by mouth at bedtime. Aspirin allergy is upset stomach 90 tablet 0   losartan (COZAAR) 100 MG tablet Take 100 mg by mouth daily.     Multiple Vitamin (MULTIVITAMIN WITH MINERALS) TABS tablet Take 1 tablet by mouth daily.     pravastatin (PRAVACHOL) 40 MG tablet Take 40 mg by mouth every morning.      Social History   Socioeconomic History  Marital status: Single    Spouse name: Not on file   Number of children: Not on file   Years of education: Not on file   Highest education level: Not on file  Occupational History   Not on file  Tobacco Use   Smoking status: Never Smoker   Smokeless tobacco: Never Used  Vaping Use   Vaping Use: Never used  Substance and Sexual Activity   Alcohol use: No   Drug use: No   Sexual activity: Never    Birth control/protection: Surgical  Other Topics Concern   Not on file  Social History Narrative   Not on file   Social Determinants of Health   Financial Resource Strain:    Difficulty of Paying Living Expenses: Not on file  Food Insecurity:    Worried About [place]  in the Last Year: Not on file   Ran Out of Food in the Last Year: Not on file  Transportation Needs:    Lack of Transportation (Medical): Not on file   Lack of Transportation (Non-Medical): Not on file  Physical Activity:    Days of Exercise per Week: Not on file   Minutes of Exercise per Session: Not on file  Stress:    Feeling of Stress : Not on file  Social Connections:    Frequency of Communication with Friends and Family: Not on file   Frequency of Social Gatherings with Friends and Family: Not on file   Attends Religious Services: Not on file   Active Member of Clubs or Organizations: Not on file   Attends Archivist Meetings: Not on file   Marital Status: Not on file  Intimate Partner Violence:    Fear of Current or Ex-Partner: Not on file   Emotionally Abused: Not on file   Physically Abused: Not on file   Sexually Abused: Not on file   Family History  Problem Relation Age of Onset   Hypertension Mother    Cancer Sister    Breast cancer Daughter    Hypertension Daughter    Colon cancer Neg Hx    Liver disease Neg Hx     OBJECTIVE:  Vitals:   09/01/19 1133 09/01/19 1137  BP:  128/65  Pulse:  77  Resp:  19  Temp:  98.7 F (37.1 C)  TempSrc:  Oral  SpO2:  94%  Weight: 138 lb (62.6 kg)   Height: 5' (1.524 m)    General appearance: AOx3 in no acute distress HEENT: NCAT.  Oropharynx clear.  Lungs: clear to auscultation bilaterally without adventitious breath sounds Heart: regular rate and rhythm.  Radial pulses 2+ symmetrical bilaterally Abdomen: soft; non-distended; no tenderness; bowel sounds present; no guarding or rebound tenderness Back: no CVA tenderness Extremities: no edema; symmetrical with no gross deformities Skin: warm and dry Neurologic: Ambulates from chair to exam table without difficulty Psychological: alert and cooperative; normal mood and affect  Labs Reviewed  POCT URINALYSIS DIP (MANUAL ENTRY) - Abnormal;  Notable for the following components:      Result Value   Color, UA light yellow (*)    Spec Grav, UA <=1.005 (*)    Blood, UA large (*)    Protein Ur, POC =30 (*)    Leukocytes, UA Moderate (2+) (*)    All other components within normal limits  URINE CULTURE    ASSESSMENT & PLAN:  1. Acute cystitis with hematuria     Meds ordered this encounter  Medications  cephALEXin (KEFLEX) 500 MG capsule    Sig: Take 1 capsule (500 mg total) by mouth 3 (three) times daily for 5 days.    Dispense:  15 capsule    Refill:  0   Discharge Instructions Urine culture sent.  We will call you with the results.   Push fluids and get plenty of rest.   Take antibiotic as directed and to completion Follow up with PCP if symptoms persists Return here or go to ER if you have any new or worsening symptoms such as fever, worsening abdominal pain, nausea/vomiting, flank pain, etc...  Outlined signs and symptoms indicating need for more acute intervention. Patient verbalized understanding. After Visit Summary given.     Note: This document was prepared using Dragon voice recognition software and may include unintentional dictation errors.    Emerson Monte, FNP 09/01/19 1200

## 2019-09-01 NOTE — Discharge Instructions (Addendum)
Urine culture sent.  We will call you with the results.   Push fluids and get plenty of rest.   Take antibiotic as directed and to completion Follow up with PCP if symptoms persists Return here or go to ER if you have any new or worsening symptoms such as fever, worsening abdominal pain, nausea/vomiting, flank pain, etc... 

## 2019-09-03 LAB — URINE CULTURE
Culture: 60000 — AB
Special Requests: NORMAL

## 2019-09-15 DIAGNOSIS — I2510 Atherosclerotic heart disease of native coronary artery without angina pectoris: Secondary | ICD-10-CM | POA: Diagnosis not present

## 2019-09-15 DIAGNOSIS — I10 Essential (primary) hypertension: Secondary | ICD-10-CM | POA: Diagnosis not present

## 2019-09-20 ENCOUNTER — Ambulatory Visit: Payer: Medicare Other | Admitting: Family Medicine

## 2019-09-27 ENCOUNTER — Encounter: Payer: Self-pay | Admitting: Family Medicine

## 2019-09-27 ENCOUNTER — Ambulatory Visit (INDEPENDENT_AMBULATORY_CARE_PROVIDER_SITE_OTHER): Payer: Medicare Other | Admitting: Family Medicine

## 2019-09-27 ENCOUNTER — Other Ambulatory Visit: Payer: Self-pay

## 2019-09-27 VITALS — BP 132/73 | HR 84 | Ht 60.0 in | Wt 135.0 lb

## 2019-09-27 DIAGNOSIS — M5431 Sciatica, right side: Secondary | ICD-10-CM

## 2019-09-27 DIAGNOSIS — N318 Other neuromuscular dysfunction of bladder: Secondary | ICD-10-CM

## 2019-09-27 DIAGNOSIS — I10 Essential (primary) hypertension: Secondary | ICD-10-CM

## 2019-09-27 DIAGNOSIS — M810 Age-related osteoporosis without current pathological fracture: Secondary | ICD-10-CM

## 2019-09-27 NOTE — Patient Instructions (Addendum)
I appreciate the opportunity to provide you with care for your health and wellness. Today we discussed: established care   Follow up: 5 weeks for BP  Labs at [facility] No referrals today  So nice to meet you today!   Avoid salt, increase water-reduce caffeine (Coke and Coffee), move slowly when changing positions.  Please continue to practice social distancing to keep you, your family, and our community safe.  If you must go out, please wear a mask and practice good handwashing.  It was a pleasure to see you and I look forward to continuing to work together on your health and well-being. Please do not hesitate to call the office if you need care or have questions about your care.  Have a wonderful day and week. With Gratitude, Cherly Beach, DNP, AGNP-BC

## 2019-09-27 NOTE — Assessment & Plan Note (Signed)
Sandra Williams is encouraged to maintain a well balanced diet that is low in salt. Controlled, reports not taking the losartan. Will remove from list and have her take Norvasc only and have follow up appt to check. Updated labs ordered today Additionally, she is also reminded that exercise is beneficial for heart health and control of  Blood pressure. 30-60 minutes daily is recommended-walking was suggested.

## 2019-09-27 NOTE — Assessment & Plan Note (Signed)
Due to medication side effects I have discussed this at length with patient and daughter and we feel that it would be better to have her just wear depends in the evening time in case she is unable to make it to the bathroom on time.

## 2019-09-27 NOTE — Assessment & Plan Note (Addendum)
Uses a cane to walk. Reports pain the low back and down the back of the right leg. Overall is doing well, some days worse than others. She has noted spinal stenosis in lumbar spine. Encourage to continue to use cane and use tylenol.  Educated on emergency signs and when to go to ED.

## 2019-09-27 NOTE — Assessment & Plan Note (Signed)
Continue Fosamax for now as she tolerates it well and she is rather active.

## 2019-09-27 NOTE — Progress Notes (Signed)
Subjective:  Patient ID: Sandra Williams, female    DOB: 01/03/1926  Age: 86 y.o. MRN: [id number]  CC: No chief complaint on file.     HPI  HPI Sandra Williams is a 86 year old female patient who presents today with her daughter to establish care.  She has a history that includes but is not limited to osteoarthritis of several joints, sciatica and lumbar spinal stenosis, hypertension, hyperlipidemia, overactive bladder, osteoporosis.  Today she reports that she is overall doing well and not having any issues or concerns.  She declines getting a flu vaccine today in the office but might at her next appointment per her.  She denies having any sleep issues.  She denies having any trouble chewing or swallowing however she is missing several teeth it does not impact her appetite or ability to eat.  She denies having any trouble going to the bathroom making water or bowel movements denies having any blood in urine or stool.  She denies having any excessive memory changes but reports a lot of forgetfulness.  She denies having any recent falls in the last 12 months.  However she does use a cane rather frequently.  She denies having a screen issues, hearing issues.  She reports that she needs to get back into the eye doctor have not seen him in some time but overall she thinks her vision is okay.  Currently taking all her medications except for her Cozaar.  She reports she is not sure why she is not taking it.  She denies having any chest pain, palpitations, leg swelling, cough, new shortness of breath, headaches, dizziness.  Today patient denies signs and symptoms of COVID 19 infection including fever, chills, cough, shortness of breath, and headache. Past Medical, Surgical, Social History, Allergies, and Medications have been Reviewed.   Past Medical History:  Diagnosis Date  . Arthritis   . ARTHRITIS 10/07/2006   Qualifier: Diagnosis of  By: Jonna Munro MD, Roderic Scarce    . Cancer ([place])     cecal carcinoma  . Chest pain 10/22/2012  . Colon cancer ([place]) 02/01/2012   Stage I (T2, N0, M0) cancer the cecum status post resection by Dr. Arnoldo Morale with 14 lymph nodes found all of which were negative. She had no evidence for metastatic disease on CT scan of the abdomen and pelvis either. Her date of surgery was 10/22/2011.  Not in need of chemotherapy for her stage I cancer.   Marland Kitchen GERD (gastroesophageal reflux disease)   . HTN (hypertension)   . Hyperlipidemia   . Iron deficiency anemia 02/01/2012   At time of colon cancer presentation  . Knee joint pain 2014   right  . Lipoma of right forearm s/p excision 07/06/2018   . OA (osteoarthritis) of knee 06/27/2012  . Osteoporosis   . Rotator cuff tear arthropathy 12/11/2013  . Small bowel obstruction ([place]) 09/14/2017    No outpatient medications have been marked as taking for the 09/27/19 encounter (Office Visit) with Perlie Mayo, NP.    ROS:  Review of Systems  Constitutional: Negative.   HENT: Negative.   Eyes: Negative.   Respiratory: Negative.   Cardiovascular: Positive for leg swelling.  Gastrointestinal: Negative.   Genitourinary: Negative.   Musculoskeletal: Negative.   Skin: Negative.   Neurological: Negative.   Endo/Heme/Allergies: Negative.   Psychiatric/Behavioral: Negative.      Objective:   Today's Vitals: BP 132/73   Pulse 84   Ht 5' (1.524 m)   Wt 135 lb  0.6 oz (61.3 kg)   SpO2 96%   BMI 26.37 kg/m  Vitals with BMI 09/27/2019 09/01/2019 11/29/2018  Height 5\' 0"  5\' 0"  5\' 1"   Weight 135 lbs 1 oz 138 lbs 136 lbs  BMI 26.37 14.48 18.56  Systolic 314 970 263  Diastolic 73 65 84  Pulse 84 77 99     Physical Exam Vitals and nursing note reviewed.  Constitutional:      Appearance: Normal appearance. She is well-developed and well-groomed.  HENT:     Head: Normocephalic and atraumatic.     Right Ear: External ear normal.     Left Ear: External ear normal.     Nose: Nose normal.     Mouth/Throat:      Mouth: Mucous membranes are moist.     Pharynx: Oropharynx is clear.  Eyes:     General:        Right eye: No discharge.        Left eye: No discharge.     Conjunctiva/sclera: Conjunctivae normal.  Cardiovascular:     Rate and Rhythm: Normal rate and regular rhythm.     Pulses: Normal pulses.     Heart sounds: Normal heart sounds.  Pulmonary:     Effort: Pulmonary effort is normal.     Breath sounds: Normal breath sounds.  Musculoskeletal:        General: Normal range of motion.     Cervical back: Normal range of motion and neck supple.     Comments: MAE; uses a cane  Skin:    General: Skin is warm.  Neurological:     General: No focal deficit present.     Mental Status: She is alert and oriented to person, place, and time.  Psychiatric:        Attention and Perception: Attention and perception normal.        Mood and Affect: Mood and affect normal.        Speech: Speech normal.        Behavior: Behavior normal. Behavior is cooperative.        Thought Content: Thought content normal.        Cognition and Memory: Cognition and memory normal.        Judgment: Judgment normal.     Comments: Pleasant in conversation        Assessment   1. HYPERTENSION, BENIGN ESSENTIAL   2. Sciatica of right side   3. Osteoporosis of multiple sites   4. OVERACTIVE BLADDER     Tests ordered Orders Placed This Encounter  Procedures  . CBC  . Comprehensive metabolic panel     Plan: Please see assessment and plan per problem list above.   No orders of the defined types were placed in this encounter.   Patient to follow-up in 11/01/2019.  Perlie Mayo, NP

## 2019-09-28 LAB — COMPREHENSIVE METABOLIC PANEL
ALT: 16 IU/L (ref 0–32)
AST: 22 IU/L (ref 0–40)
Albumin/Globulin Ratio: 1.5 (ref 1.2–2.2)
Albumin: 4.4 g/dL (ref 3.5–4.6)
Alkaline Phosphatase: 54 IU/L (ref 44–121)
BUN/Creatinine Ratio: 20 (ref 12–28)
BUN: 12 mg/dL (ref 10–36)
Bilirubin Total: 0.4 mg/dL (ref 0.0–1.2)
CO2: 25 mmol/L (ref 20–29)
Calcium: 9.4 mg/dL (ref 8.7–10.3)
Chloride: 108 mmol/L — ABNORMAL HIGH (ref 96–106)
Creatinine, Ser: 0.61 mg/dL (ref 0.57–1.00)
GFR calc Af Amer: 91 mL/min/{1.73_m2} (ref >59)
GFR calc non Af Amer: 79 mL/min/{1.73_m2} (ref >59)
Globulin, Total: 3 g/dL (ref 1.5–4.5)
Glucose: 85 mg/dL (ref 65–99)
Potassium: 5.2 mmol/L (ref 3.5–5.2)
Sodium: 146 mmol/L — ABNORMAL HIGH (ref 134–144)
Total Protein: 7.4 g/dL (ref 6.0–8.5)

## 2019-09-28 LAB — CBC
Hematocrit: 40.1 % (ref 34.0–46.6)
Hemoglobin: 12.9 g/dL (ref 11.1–15.9)
MCH: 28.5 pg (ref 26.6–33.0)
MCHC: 32.2 g/dL (ref 31.5–35.7)
MCV: 89 fL (ref 79–97)
Platelets: 292 10*3/uL (ref 150–450)
RBC: 4.53 x10E6/uL (ref 3.77–5.28)
RDW: 13.5 % (ref 11.7–15.4)
WBC: 5 10*3/uL (ref 3.4–10.8)

## 2019-11-01 ENCOUNTER — Ambulatory Visit: Payer: Medicare Other | Admitting: Family Medicine

## 2019-11-06 ENCOUNTER — Encounter: Payer: Self-pay | Admitting: Family Medicine

## 2019-11-06 ENCOUNTER — Ambulatory Visit (INDEPENDENT_AMBULATORY_CARE_PROVIDER_SITE_OTHER): Payer: Medicare Other | Admitting: Family Medicine

## 2019-11-06 ENCOUNTER — Other Ambulatory Visit: Payer: Self-pay

## 2019-11-06 VITALS — BP 132/70 | HR 80 | Ht 60.0 in | Wt 137.0 lb

## 2019-11-06 DIAGNOSIS — Z23 Encounter for immunization: Secondary | ICD-10-CM | POA: Diagnosis not present

## 2019-11-06 DIAGNOSIS — I10 Essential (primary) hypertension: Secondary | ICD-10-CM

## 2019-11-06 DIAGNOSIS — G5601 Carpal tunnel syndrome, right upper limb: Secondary | ICD-10-CM | POA: Diagnosis not present

## 2019-11-06 MED ORDER — UNABLE TO FIND: 0 refills | Status: DC

## 2019-11-06 MED ORDER — GABAPENTIN 100 MG PO CAPS: 100.0000 mg | ORAL_CAPSULE | Freq: Every day | ORAL | 2 refills | Status: DC

## 2019-11-06 NOTE — Assessment & Plan Note (Signed)
Blood pressure is controlled today.  We will continue Norvasc only.  Encouraged DASH diet and walking as tolerated.

## 2019-11-06 NOTE — Assessment & Plan Note (Signed)
Questionable worsening carpal tunnel syndrome on the right versus cervical spinal stenosis which is been found on previous scans.  Will provide prescription for a new brace and start low-dose Neurontin once daily.  If unable to improve or tolerate the side effects of Neurontin will consider neurology referral. Patient acknowledged agreement and understanding of the plan.   Reviewed side effects, risks and benefits of medication.

## 2019-11-06 NOTE — Assessment & Plan Note (Signed)

## 2019-11-06 NOTE — Patient Instructions (Addendum)
  HAPPY FALL!  I appreciate the opportunity to provide you with care for your health and wellness. Today we discussed: right hand numbness, side effects of medication, BP  Follow up: 3 months   No labs or referrals today  Due to side effect of Fosamax lets stop it.  Start Calcium 500 mg 3 times daily with a meal Vitamin D 2,000 IU daily.  Start Neurontin 100 mg once daily in the morning to see if improve Right hand numbness Script for Right hand brace  If numbness does not improve we will refer to Neurologist.  Have a great Holiday Season!  Please continue to practice social distancing to keep you, your family, and our community safe.  If you must go out, please wear a mask and practice good handwashing.  It was a pleasure to see you and I look forward to continuing to work together on your health and well-being. Please do not hesitate to call the office if you need care or have questions about your care.  Have a wonderful day and week. With Gratitude, Cherly Beach, DNP, AGNP-BC

## 2019-11-06 NOTE — Progress Notes (Signed)
Subjective:  Patient ID: Sandra Williams, female    DOB: Oct 07, 1925  Age: 86 y.o. MRN: [id number]  CC:  Chief Complaint  Patient presents with  . Follow-up    b/p  . Arm Pain    R hand feels numb for awhile now      HPI  HPI  Sandra Williams is a 86 year old female patient of mine she presents today for blood pressure follow-up. Previous visit her daughter had reported that she stopped taking the losartan and because her blood pressure was controlled willing to know if we need to continue it.  Blood pressure is controlled today doing well not having any outstanding side effects or signs or symptoms of blood pressure increasing during the day or night.  We will continue Norvasc at this time.  She denies having any chest pain, cough, shortness of breath, inability to lay flat, headaches or vision changes.  Of note she reports that the Fosamax every time she takes it gives her dizziness.  She denies having any other signs or symptoms.  But she reports that the dizziness is strong enough to make her feel like she is going to fall.  So Fosamax will be stopped today and she will be optimized on calcium and vitamin D for now.  Right hand feels numb been off and on for a while now carpal tunnel syndrome.  Has a brace that she wears at night needs a new one.  Is going to try low-dose Neurontin at this time.  Possible referral in the future if not improved   Today patient denies signs and symptoms of COVID 19 infection including fever, chills, cough, shortness of breath, and headache. Past Medical, Surgical, Social History, Allergies, and Medications have been Reviewed.   Past Medical History:  Diagnosis Date  . Arthritis   . ARTHRITIS 10/07/2006   Qualifier: Diagnosis of  By: Jonna Munro MD, Roderic Scarce    . Cancer ([place])    cecal carcinoma  . Chest pain 10/22/2012  . Colon cancer ([place]) 02/01/2012   Stage I (T2, N0, M0) cancer the cecum status post resection by Dr. Arnoldo Morale with 14 lymph  nodes found all of which were negative. She had no evidence for metastatic disease on CT scan of the abdomen and pelvis either. Her date of surgery was 10/22/2011.  Not in need of chemotherapy for her stage I cancer.   Marland Kitchen GERD (gastroesophageal reflux disease)   . HTN (hypertension)   . Hyperlipidemia   . Iron deficiency anemia 02/01/2012   At time of colon cancer presentation  . Knee joint pain 2014   right  . Lipoma of right forearm s/p excision 07/06/2018   . OA (osteoarthritis) of knee 06/27/2012  . Osteoporosis   . Rotator cuff tear arthropathy 12/11/2013  . Small bowel obstruction ([place]) 09/14/2017    Current Meds  Medication Sig  . amLODipine (NORVASC) 5 MG tablet Take 5 mg by mouth daily.  Marland Kitchen aspirin 81 MG EC tablet Take 81 mg by mouth daily.   . hydroxypropyl methylcellulose / hypromellose (ISOPTO TEARS / GONIOVISC) 2.5 % ophthalmic solution Place 1 drop into both eyes 3 (three) times daily as needed for dry eyes (irritation).  . Multiple Vitamin (MULTIVITAMIN WITH MINERALS) TABS tablet Take 1 tablet by mouth daily.  . pravastatin (PRAVACHOL) 40 MG tablet Take 40 mg by mouth every morning.   . [DISCONTINUED] alendronate (FOSAMAX) 70 MG tablet Take 70 mg by mouth every Saturday. Take with a full glass  of water on an empty stomach.     ROS:  Review of Systems  Constitutional: Negative.   HENT: Negative.   Eyes: Negative.   Respiratory: Negative.   Cardiovascular: Negative.   Gastrointestinal: Negative.   Genitourinary: Negative.   Musculoskeletal: Negative.   Skin: Negative.   Neurological: Positive for dizziness and sensory change.  Endo/Heme/Allergies: Negative.   Psychiatric/Behavioral: Negative.      Objective:   Today's Vitals: BP 132/70 (BP Location: Left Arm, Patient Position: Sitting, Cuff Size: Normal)   Pulse 80   Ht 5' (1.524 m)   Wt 137 lb (62.1 kg)   SpO2 99%   BMI 26.76 kg/m  Vitals with BMI 11/06/2019 09/27/2019 09/01/2019  Height 5\' 0"  5\' 0"  5\' 0"     Weight 137 lbs 135 lbs 1 oz 138 lbs  BMI 26.76 54.65 68.12  Systolic 751 700 174  Diastolic 70 73 65  Pulse 80 84 77     Physical Exam Vitals and nursing note reviewed.  Constitutional:      Appearance: Normal appearance. She is well-developed, well-groomed and overweight.  HENT:     Head: Normocephalic and atraumatic.     Right Ear: External ear normal.     Left Ear: External ear normal.     Mouth/Throat:     Comments: Mask in place Eyes:     General:        Right eye: No discharge.        Left eye: No discharge.     Conjunctiva/sclera: Conjunctivae normal.  Cardiovascular:     Rate and Rhythm: Normal rate and regular rhythm.     Pulses: Normal pulses.     Heart sounds: Normal heart sounds.  Pulmonary:     Effort: Pulmonary effort is normal.     Breath sounds: Normal breath sounds.  Musculoskeletal:        General: Normal range of motion.     Cervical back: Normal range of motion and neck supple.  Skin:    General: Skin is warm.  Neurological:     General: No focal deficit present.     Mental Status: She is alert and oriented to person, place, and time.  Psychiatric:        Attention and Perception: Attention normal.        Mood and Affect: Mood normal.        Speech: Speech normal.        Behavior: Behavior normal. Behavior is cooperative.        Thought Content: Thought content normal.        Cognition and Memory: Cognition normal.        Judgment: Judgment normal.          Assessment   1. HYPERTENSION, BENIGN ESSENTIAL   2. Carpal tunnel syndrome of right wrist   3. Need for immunization against influenza     Tests ordered Orders Placed This Encounter  Procedures  . Flu Vaccine QUAD High Dose(Fluad)     Plan: Please see assessment and plan per problem list above.   Meds ordered this encounter  Medications  . gabapentin (NEURONTIN) 100 MG capsule    Sig: Take 1 capsule (100 mg total) by mouth daily.    Dispense:  30 capsule    Refill:   2    Order Specific Question:   Supervising Provider    Answer:   SIMPSON, MARGARET E [[id number]]  . UNABLE TO FIND    Sig: Med Name: Right  hand brace, soft, for carpal tunnel. DX: G56.01    Dispense:  1 Product    Refill:  0    Patient to follow-up in 02/06/2020  Note: This dictation was prepared with Dragon dictation along with smaller phrase technology. Similar sounding words can be transcribed inadequately or may not be corrected upon review. Any transcriptional errors that result from this process are unintentional.      Perlie Mayo, NP

## 2019-12-05 DIAGNOSIS — Z23 Encounter for immunization: Secondary | ICD-10-CM | POA: Diagnosis not present

## 2020-01-21 ENCOUNTER — Ambulatory Visit
Admission: EM | Admit: 2020-01-21 | Discharge: 2020-01-21 | Disposition: A | Discharge status: Home / Self Care | Payer: Medicare Other | Attending: Family Medicine | Admitting: Family Medicine

## 2020-01-21 ENCOUNTER — Other Ambulatory Visit: Payer: Self-pay

## 2020-01-21 ENCOUNTER — Encounter: Payer: Self-pay | Admitting: Emergency Medicine

## 2020-01-21 DIAGNOSIS — R5383 Other fatigue: Secondary | ICD-10-CM | POA: Diagnosis not present

## 2020-01-21 DIAGNOSIS — J069 Acute upper respiratory infection, unspecified: Secondary | ICD-10-CM

## 2020-01-21 DIAGNOSIS — R059 Cough, unspecified: Secondary | ICD-10-CM | POA: Diagnosis not present

## 2020-01-21 MED ORDER — AZITHROMYCIN 250 MG PO TABS: 250.0000 mg | ORAL_TABLET | Freq: Every day | ORAL | 0 refills | Status: DC

## 2020-01-21 MED ORDER — BENZONATATE 100 MG PO CAPS: 100.0000 mg | ORAL_CAPSULE | Freq: Three times a day (TID) | ORAL | 0 refills | Status: DC

## 2020-01-21 NOTE — Discharge Instructions (Addendum)
I have sent in azithromycin for you to take. Take 2 tablets today, then one tablet daily for the next 4 days.  I have sent in [place] for you to use one capsule every 8 hours as needed for cough.  Your COVID test is pending.  You should self quarantine until the test result is back.    Take Tylenol or ibuprofen as needed for fever or discomfort.  Rest and keep yourself hydrated.    Follow-up with your primary care provider if your symptoms are not improving.

## 2020-01-21 NOTE — ED Triage Notes (Signed)
Cough x 4-5 days.  Reports coughing up yellow mucous.

## 2020-01-21 NOTE — ED Provider Notes (Signed)
[facility]   [id number] 01/21/20 Arrival Time: 1223   CC: COVID symptoms  SUBJECTIVE: History from: patient.  Sandra Williams is a 87 y.o. female who presents with cough and fever for 4-5 days. Reports coughing up thick yellow mucus. Denies sick exposure to COVID, flu or strep. Denies recent travel. Has negative history of Covid. Has completed Covid vaccines. Has completed flu vaccine. Has taken OTC cough and cold with little relief. Reports that cough is worse at night. Denies previous symptoms in the past. Denies chills, fatigue, sinus pain, rhinorrhea, sore throat, SOB, wheezing, chest pain, nausea, changes in bowel or bladder habits.    ROS: As per HPI.  All other pertinent ROS negative.     Past Medical History:  Diagnosis Date  . Arthritis   . ARTHRITIS 10/07/2006   Qualifier: Diagnosis of  By: Jonna Munro MD, Roderic Scarce    . Cancer ([place])    cecal carcinoma  . Chest pain 10/22/2012  . Colon cancer ([place]) 02/01/2012   Stage I (T2, N0, M0) cancer the cecum status post resection by Dr. Arnoldo Morale with 14 lymph nodes found all of which were negative. She had no evidence for metastatic disease on CT scan of the abdomen and pelvis either. Her date of surgery was 10/22/2011.  Not in need of chemotherapy for her stage I cancer.   Marland Kitchen GERD (gastroesophageal reflux disease)   . HTN (hypertension)   . Hyperlipidemia   . Iron deficiency anemia 02/01/2012   At time of colon cancer presentation  . Knee joint pain 2014   right  . Lipoma of right forearm s/p excision 07/06/2018   . OA (osteoarthritis) of knee 06/27/2012  . Osteoporosis   . Rotator cuff tear arthropathy 12/11/2013  . Small bowel obstruction ([place]) 09/14/2017   Past Surgical History:  Procedure Laterality Date  . ABDOMINAL HYSTERECTOMY     partial  . APPENDECTOMY     [facility]  . CATARACT EXTRACTION Bilateral   . CESAREAN SECTION    . COLON SURGERY    . dental implant    . LIPOMA EXCISION Right 07/06/2018   Procedure:  EXCISION LIPOMA RIGHT FOREARM;  Surgeon: Carole Civil, MD;  Location: [facility];  Service: Orthopedics;  Laterality: Right;  . PARTIAL COLECTOMY  10/22/2011   Procedure: PARTIAL COLECTOMY;  Surgeon: Jamesetta So, MD;  Location: [facility];  Service: General;  Laterality: N/A;  . TOTAL KNEE ARTHROPLASTY Right 01/30/2014   Procedure: TOTAL KNEE ARTHROPLASTY;  Surgeon: Carole Civil, MD;  Location: [facility];  Service: Orthopedics;  Laterality: Right;   Allergies  Allergen Reactions  . Aspirin     REACTION: Upset stomach if takes regular and uncoated    No current facility-administered medications on file prior to encounter.   Current Outpatient Medications on File Prior to Encounter  Medication Sig Dispense Refill  . amLODipine (NORVASC) 5 MG tablet Take 5 mg by mouth daily.  3  . aspirin 81 MG EC tablet Take 81 mg by mouth daily.     Marland Kitchen gabapentin (NEURONTIN) 100 MG capsule Take 1 capsule (100 mg total) by mouth daily. 30 capsule 2  . hydroxypropyl methylcellulose / hypromellose (ISOPTO TEARS / GONIOVISC) 2.5 % ophthalmic solution Place 1 drop into both eyes 3 (three) times daily as needed for dry eyes (irritation).    . Multiple Vitamin (MULTIVITAMIN WITH MINERALS) TABS tablet Take 1 tablet by mouth daily.    . pravastatin (PRAVACHOL) 40 MG tablet Take 40 mg by mouth  every morning.     Marland Kitchen UNABLE TO FIND Med Name: Right hand brace, soft, for carpal tunnel. DX: G56.01 1 Product 0   Social History   Socioeconomic History  . Marital status: Single    Spouse name: Not on file  . Number of children: 3  . Years of education: Not on file  . Highest education level: Not on file  Occupational History  . Not on file  Tobacco Use  . Smoking status: Never Smoker  . Smokeless tobacco: Never Used  Vaping Use  . Vaping Use: Never used  Substance and Sexual Activity  . Alcohol use: No  . Drug use: No  . Sexual activity: Never    Birth control/protection: Surgical  Other Topics Concern  .  Not on file  Social History Narrative   Lives with sister in [place], [place]- son of sister also lives there      Enjoy: sits on the porch- people watching, use to like bingo, walks around house a lot      Diet: eats all food groups   Caffeine: coffee- 3-4 days a week, some tea, drinks coke 3 times a week    Water:  4-5 cups roughly      Wears seat belt   No longer drives   Smoke detectors at home   No weapons she knows of    Social Determinants of Health   Financial Resource Strain: Low Risk   . Difficulty of Paying Living Expenses: Not hard at all  Food Insecurity: No Food Insecurity  . Worried About Charity fundraiser in the Last Year: Never true  . Ran Out of Food in the Last Year: Never true  Transportation Needs: No Transportation Needs  . Lack of Transportation (Medical): No  . Lack of Transportation (Non-Medical): No  Physical Activity: Insufficiently Active  . Days of Exercise per Week: 5 days  . Minutes of Exercise per Session: 10 min  Stress: No Stress Concern Present  . Feeling of Stress : Only a little  Social Connections: Moderately Isolated  . Frequency of Communication with Friends and Family: More than three times a week  . Frequency of Social Gatherings with Friends and Family: More than three times a week  . Attends Religious Services: 1 to 4 times per year  . Active Member of Clubs or Organizations: No  . Attends Archivist Meetings: Never  . Marital Status: Never married  Intimate Partner Violence: Not At Risk  . Fear of Current or Ex-Partner: No  . Emotionally Abused: No  . Physically Abused: No  . Sexually Abused: No   Family History  Problem Relation Age of Onset  . Hypertension Mother   . Cancer Sister   . Breast cancer Daughter   . Hypertension Daughter   . Colon cancer Neg Hx   . Liver disease Neg Hx     OBJECTIVE:  Vitals:   01/21/20 1232  BP: (!) 179/63  Pulse: (!) 103  Resp: 16  Temp: 99.1 F (37.3 C)   TempSrc: Oral  SpO2: 96%     General appearance: alert; appears fatigued, but nontoxic; speaking in full sentences and tolerating own secretions HEENT: NCAT; Ears: EACs clear, TMs pearly gray; Eyes: PERRL.  EOM grossly intact. Sinuses: nontender; Nose: nares patent with clear rhinorrhea, Throat: oropharynx erythematous, cobblestoning present, tonsils non erythematous or enlarged, uvula midline  Neck: supple without LAD Lungs: unlabored respirations, symmetrical air entry; cough: mild; no  respiratory distress; mild wheezing to bilateral lower lobes Heart: regular rate and rhythm.  Radial pulses 2+ symmetrical bilaterally Skin: warm and dry Psychological: alert and cooperative; normal mood and affect  LABS:  No results found for this or any previous visit (from the past 24 hour(s)).   ASSESSMENT & PLAN:  1. Upper respiratory tract infection, unspecified type   2. Cough   3. Other fatigue     Meds ordered this encounter  Medications  . benzonatate (TESSALON) 100 MG capsule    Sig: Take 1 capsule (100 mg total) by mouth every 8 (eight) hours.    Dispense:  21 capsule    Refill:  0    Order Specific Question:   Supervising Provider    Answer:   Chase Picket [id number]  . azithromycin (ZITHROMAX) 250 MG tablet    Sig: Take 1 tablet (250 mg total) by mouth daily. Take first 2 tablets together, then 1 every day until finished.    Dispense:  6 tablet    Refill:  0    Order Specific Question:   Supervising Provider    Answer:   Chase Picket [id number]    Will treat for URI given length of symptoms and clinical presentation Azithromycin prescribed  Tessalon perles prescribed  Continue supportive care at home COVID and flu testing ordered.  It will take between 2-3 days for test results. Someone will contact you regarding abnormal results. Patient should remain in quarantine until they have received Covid results.  If negative you may resume normal activities (go back to  work/school) while practicing hand hygiene, social distance, and mask wearing.  If positive, patient should remain in quarantine for at least 5 days from symptom onset AND greater than 72 hours after symptoms resolution (absence of fever without the use of fever-reducing medication and improvement in respiratory symptoms), whichever is longer Get plenty of rest and push fluids Use OTC zyrtec for nasal congestion, runny nose, and/or sore throat Use OTC flonase for nasal congestion and runny nose Use medications daily for symptom relief Use OTC medications like ibuprofen or tylenol as needed fever or pain Call or go to the ED if you have any new or worsening symptoms such as fever, worsening cough, shortness of breath, chest tightness, chest pain, turning blue, changes in mental status.  Reviewed expectations re: course of current medical issues. Questions answered. Outlined signs and symptoms indicating need for more acute intervention. Patient verbalized understanding. After Visit Summary given.         Faustino Congress, NP 01/21/20 1320

## 2020-01-23 LAB — COVID-19, FLU A+B NAA
Influenza A, NAA: NOT DETECTED
Influenza B, NAA: NOT DETECTED
SARS-CoV-2, NAA: NOT DETECTED

## 2020-02-06 ENCOUNTER — Encounter: Payer: Self-pay | Admitting: Nurse Practitioner

## 2020-02-06 ENCOUNTER — Ambulatory Visit (INDEPENDENT_AMBULATORY_CARE_PROVIDER_SITE_OTHER): Payer: Medicare Other | Admitting: Nurse Practitioner

## 2020-02-06 ENCOUNTER — Other Ambulatory Visit: Payer: Self-pay

## 2020-02-06 ENCOUNTER — Ambulatory Visit: Payer: Medicare Other | Admitting: Family Medicine

## 2020-02-06 DIAGNOSIS — R200 Anesthesia of skin: Secondary | ICD-10-CM | POA: Diagnosis not present

## 2020-02-06 DIAGNOSIS — I10 Essential (primary) hypertension: Secondary | ICD-10-CM | POA: Diagnosis not present

## 2020-02-06 DIAGNOSIS — R202 Paresthesia of skin: Secondary | ICD-10-CM | POA: Diagnosis not present

## 2020-02-06 MED ORDER — AMLODIPINE BESYLATE 10 MG PO TABS: 10.0000 mg | ORAL_TABLET | Freq: Every day | ORAL | 1 refills | Status: DC

## 2020-02-06 NOTE — Assessment & Plan Note (Signed)
-  INCREASE amlodipine to 10 mg daily -recheck in 2 weeks

## 2020-02-06 NOTE — Assessment & Plan Note (Signed)
-  R>L numbness and tingling -this has been ongoing despite a brace and gabapentin -refer to ortho

## 2020-02-06 NOTE — Patient Instructions (Signed)
We'll recheck your BP in 2 weeks since we increased the amlodipine to 10 mg daily.  Please get fasting labs prior to that appointment.

## 2020-02-06 NOTE — Progress Notes (Signed)
Established Patient Office Visit  Subjective:  Patient ID: Sandra Williams, female    DOB: May 01, 1925  Age: 87 y.o. MRN: [id number]  CC:  Chief Complaint  Patient presents with  . Hypertension    HPI Sandra Williams presents for BP check.  At her last visit she stopped losartan, and is just taking amlodipine. Alendronate was also stopped at that time for side effects, and she is just taking calcium and Vit D.  She has hx of right carpal tunnel, and she has been wearing a brace and taking gabapentin.  She does not check her BP at home.  Past Medical History:  Diagnosis Date  . Arthritis   . ARTHRITIS 10/07/2006   Qualifier: Diagnosis of  By: Jonna Munro MD, Roderic Scarce    . Cancer ([place])    cecal carcinoma  . Chest pain 10/22/2012  . Colon cancer ([place]) 02/01/2012   Stage I (T2, N0, M0) cancer the cecum status post resection by Dr. Arnoldo Morale with 14 lymph nodes found all of which were negative. She had no evidence for metastatic disease on CT scan of the abdomen and pelvis either. Her date of surgery was 10/22/2011.  Not in need of chemotherapy for her stage I cancer.   Marland Kitchen GERD (gastroesophageal reflux disease)   . HTN (hypertension)   . Hyperlipidemia   . Iron deficiency anemia 02/01/2012   At time of colon cancer presentation  . Knee joint pain 2014   right  . Lipoma of right forearm s/p excision 07/06/2018   . OA (osteoarthritis) of knee 06/27/2012  . Osteoporosis   . Rotator cuff tear arthropathy 12/11/2013  . Small bowel obstruction ([place]) 09/14/2017    Past Surgical History:  Procedure Laterality Date  . ABDOMINAL HYSTERECTOMY     partial  . APPENDECTOMY     [facility]  . CATARACT EXTRACTION Bilateral   . CESAREAN SECTION    . COLON SURGERY    . dental implant    . LIPOMA EXCISION Right 07/06/2018   Procedure: EXCISION LIPOMA RIGHT FOREARM;  Surgeon: Carole Civil, MD;  Location: [facility];  Service: Orthopedics;  Laterality: Right;  . PARTIAL COLECTOMY  10/22/2011    Procedure: PARTIAL COLECTOMY;  Surgeon: Jamesetta So, MD;  Location: [facility];  Service: General;  Laterality: N/A;  . TOTAL KNEE ARTHROPLASTY Right 01/30/2014   Procedure: TOTAL KNEE ARTHROPLASTY;  Surgeon: Carole Civil, MD;  Location: [facility];  Service: Orthopedics;  Laterality: Right;    Family History  Problem Relation Age of Onset  . Hypertension Mother   . Cancer Sister   . Breast cancer Daughter   . Hypertension Daughter   . Colon cancer Neg Hx   . Liver disease Neg Hx     Social History   Socioeconomic History  . Marital status: Single    Spouse name: Not on file  . Number of children: 3  . Years of education: Not on file  . Highest education level: Not on file  Occupational History  . Not on file  Tobacco Use  . Smoking status: Never Smoker  . Smokeless tobacco: Never Used  Vaping Use  . Vaping Use: Never used  Substance and Sexual Activity  . Alcohol use: No  . Drug use: No  . Sexual activity: Never    Birth control/protection: Surgical  Other Topics Concern  . Not on file  Social History Narrative   Lives with sister in [place], [place]- son of sister also  lives there      Enjoy: sits on the porch- people watching, use to like bingo, walks around house a lot      Diet: eats all food groups   Caffeine: coffee- 3-4 days a week, some tea, drinks coke 3 times a week    Water:  4-5 cups roughly      Wears seat belt   No longer drives   Smoke detectors at home   No weapons she knows of    Social Determinants of Health   Financial Resource Strain: Low Risk   . Difficulty of Paying Living Expenses: Not hard at all  Food Insecurity: No Food Insecurity  . Worried About Charity fundraiser in the Last Year: Never true  . Ran Out of Food in the Last Year: Never true  Transportation Needs: No Transportation Needs  . Lack of Transportation (Medical): No  . Lack of Transportation (Non-Medical): No  Physical Activity: Insufficiently Active  . Days  of Exercise per Week: 5 days  . Minutes of Exercise per Session: 10 min  Stress: No Stress Concern Present  . Feeling of Stress : Only a little  Social Connections: Moderately Isolated  . Frequency of Communication with Friends and Family: More than three times a week  . Frequency of Social Gatherings with Friends and Family: More than three times a week  . Attends Religious Services: 1 to 4 times per year  . Active Member of Clubs or Organizations: No  . Attends Archivist Meetings: Never  . Marital Status: Never married  Intimate Partner Violence: Not At Risk  . Fear of Current or Ex-Partner: No  . Emotionally Abused: No  . Physically Abused: No  . Sexually Abused: No    Outpatient Medications Prior to Visit  Medication Sig Dispense Refill  . aspirin 81 MG EC tablet Take 81 mg by mouth daily.     Marland Kitchen gabapentin (NEURONTIN) 100 MG capsule Take 1 capsule (100 mg total) by mouth daily. 30 capsule 2  . hydroxypropyl methylcellulose / hypromellose (ISOPTO TEARS / GONIOVISC) 2.5 % ophthalmic solution Place 1 drop into both eyes 3 (three) times daily as needed for dry eyes (irritation).    . Multiple Vitamin (MULTIVITAMIN WITH MINERALS) TABS tablet Take 1 tablet by mouth daily.    . pravastatin (PRAVACHOL) 40 MG tablet Take 40 mg by mouth every morning.     Marland Kitchen UNABLE TO FIND Med Name: Right hand brace, soft, for carpal tunnel. DX: G56.01 1 Product 0  . amLODipine (NORVASC) 5 MG tablet Take 5 mg by mouth daily.  3  . azithromycin (ZITHROMAX) 250 MG tablet Take 1 tablet (250 mg total) by mouth daily. Take first 2 tablets together, then 1 every day until finished. 6 tablet 0  . benzonatate (TESSALON) 100 MG capsule Take 1 capsule (100 mg total) by mouth every 8 (eight) hours. 21 capsule 0   No facility-administered medications prior to visit.    Allergies  Allergen Reactions  . Aspirin     REACTION: Upset stomach if takes regular and uncoated     ROS Review of Systems   Constitutional: Negative.   Respiratory: Negative.   Cardiovascular: Negative.   Musculoskeletal: Positive for arthralgias.       R> L hand numbness and tingling  Neurological: Positive for numbness.       To bilateral hands; R>L      Objective:    Physical Exam Constitutional:  Appearance: Normal appearance.  Cardiovascular:     Rate and Rhythm: Normal rate and regular rhythm.     Pulses: Normal pulses.     Heart sounds: Normal heart sounds.  Pulmonary:     Breath sounds: Normal breath sounds.  Musculoskeletal:        General: Deformity present.     Comments: R>L d/t arthritis; could not get in correct position of Phalen's d/t arthritis, but did not have increased numbness when attempting this  Neurological:     Mental Status: She is alert.     BP (!) 153/70   Pulse (!) 104   Temp 98.4 F (36.9 C)   Resp 16   Ht 5' (1.524 m)   Wt 135 lb (61.2 kg)   SpO2 95%   BMI 26.37 kg/m  Wt Readings from Last 3 Encounters:  02/06/20 135 lb (61.2 kg)  11/06/19 137 lb (62.1 kg)  09/27/19 135 lb 0.6 oz (61.3 kg)     Health Maintenance Due  Topic Date Due  . COVID-19 Vaccine (1) Never done  . PNA vac Low Risk Adult (2 of 2 - PCV13) 10/07/2007    There are no preventive care reminders to display for this patient.  Lab Results  Component Value Date   TSH 1.338 01/12/2012   Lab Results  Component Value Date   WBC 5.0 09/27/2019   HGB 12.9 09/27/2019   HCT 40.1 09/27/2019   MCV 89 09/27/2019   PLT 292 09/27/2019   Lab Results  Component Value Date   NA 146 (H) 09/27/2019   K 5.2 09/27/2019   CO2 25 09/27/2019   GLUCOSE 85 09/27/2019   BUN 12 09/27/2019   CREATININE 0.61 09/27/2019   BILITOT 0.4 09/27/2019   ALKPHOS 54 09/27/2019   AST 22 09/27/2019   ALT 16 09/27/2019   PROT 7.4 09/27/2019   ALBUMIN 4.4 09/27/2019   CALCIUM 9.4 09/27/2019   ANIONGAP 8 07/03/2018   Lab Results  Component Value Date   CHOL 239 (H) 08/14/2008   Lab Results   Component Value Date   HDL 74 08/14/2008   Lab Results  Component Value Date   LDLCALC 142 (H) 08/14/2008   Lab Results  Component Value Date   TRIG 113 08/14/2008   Lab Results  Component Value Date   CHOLHDL 3.2 Ratio 08/14/2008   No results found for: HGBA1C    Assessment & Plan:   Problem List Items Addressed This Visit      Cardiovascular and Mediastinum   HYPERTENSION, BENIGN ESSENTIAL    -INCREASE amlodipine to 10 mg daily -recheck in 2 weeks      Relevant Medications   amLODipine (NORVASC) 10 MG tablet   Other Relevant Orders   CBC with Differential/Platelet   CMP14+EGFR   Lipid Panel With LDL/HDL Ratio     Other   Numbness and tingling in right hand    -R>L numbness and tingling -this has been ongoing despite a brace and gabapentin -refer to ortho      Relevant Orders   Ambulatory referral to Orthopedic Surgery      Meds ordered this encounter  Medications  . amLODipine (NORVASC) 10 MG tablet    Sig: Take 1 tablet (10 mg total) by mouth daily.    Dispense:  30 tablet    Refill:  1    Follow-up: Return in about 2 weeks (around 02/20/2020) for Lab follow-up and BP check.    Noreene Larsson, NP

## 2020-02-20 ENCOUNTER — Encounter: Payer: Self-pay | Admitting: Nurse Practitioner

## 2020-02-20 ENCOUNTER — Other Ambulatory Visit: Payer: Self-pay

## 2020-02-20 ENCOUNTER — Ambulatory Visit (INDEPENDENT_AMBULATORY_CARE_PROVIDER_SITE_OTHER): Payer: Medicare Other | Admitting: Internal Medicine

## 2020-02-20 VITALS — BP 139/72 | HR 84 | Temp 98.2°F | Resp 20 | Ht 60.0 in | Wt 132.0 lb

## 2020-02-20 DIAGNOSIS — I10 Essential (primary) hypertension: Secondary | ICD-10-CM

## 2020-02-20 DIAGNOSIS — G5601 Carpal tunnel syndrome, right upper limb: Secondary | ICD-10-CM

## 2020-02-20 DIAGNOSIS — M810 Age-related osteoporosis without current pathological fracture: Secondary | ICD-10-CM

## 2020-02-20 LAB — CBC WITH DIFFERENTIAL/PLATELET
Basophils Absolute: 0 10*3/uL (ref 0.0–0.2)
Basos: 1 %
EOS (ABSOLUTE): 0.2 10*3/uL (ref 0.0–0.4)
Eos: 3 %
Hematocrit: 41.2 % (ref 34.0–46.6)
Hemoglobin: 13.5 g/dL (ref 11.1–15.9)
Immature Grans (Abs): 0 10*3/uL (ref 0.0–0.1)
Immature Granulocytes: 0 %
Lymphocytes Absolute: 2.4 10*3/uL (ref 0.7–3.1)
Lymphs: 43 %
MCH: 28.4 pg (ref 26.6–33.0)
MCHC: 32.8 g/dL (ref 31.5–35.7)
MCV: 87 fL (ref 79–97)
Monocytes Absolute: 0.6 10*3/uL (ref 0.1–0.9)
Monocytes: 10 %
Neutrophils Absolute: 2.5 10*3/uL (ref 1.4–7.0)
Neutrophils: 43 %
Platelets: 252 10*3/uL (ref 150–450)
RBC: 4.75 x10E6/uL (ref 3.77–5.28)
RDW: 13.3 % (ref 11.7–15.4)
WBC: 5.7 10*3/uL (ref 3.4–10.8)

## 2020-02-20 LAB — CMP14+EGFR
ALT: 12 IU/L (ref 0–32)
AST: 20 IU/L (ref 0–40)
Albumin/Globulin Ratio: 1.8 (ref 1.2–2.2)
Albumin: 4.4 g/dL (ref 3.5–4.6)
Alkaline Phosphatase: 56 IU/L (ref 44–121)
BUN/Creatinine Ratio: 18 (ref 12–28)
BUN: 19 mg/dL (ref 10–36)
Bilirubin Total: 0.6 mg/dL (ref 0.0–1.2)
CO2: 22 mmol/L (ref 20–29)
Calcium: 9.3 mg/dL (ref 8.7–10.3)
Chloride: 104 mmol/L (ref 96–106)
Creatinine, Ser: 1.03 mg/dL — ABNORMAL HIGH (ref 0.57–1.00)
GFR calc Af Amer: 54 mL/min/{1.73_m2} — ABNORMAL LOW (ref >59)
GFR calc non Af Amer: 47 mL/min/{1.73_m2} — ABNORMAL LOW (ref >59)
Globulin, Total: 2.5 g/dL (ref 1.5–4.5)
Glucose: 98 mg/dL (ref 65–99)
Potassium: 4.8 mmol/L (ref 3.5–5.2)
Sodium: 144 mmol/L (ref 134–144)
Total Protein: 6.9 g/dL (ref 6.0–8.5)

## 2020-02-20 LAB — LIPID PANEL WITH LDL/HDL RATIO
Cholesterol, Total: 199 mg/dL (ref 100–199)
HDL: 86 mg/dL (ref >39)
LDL Chol Calc (NIH): 99 mg/dL (ref 0–99)
LDL/HDL Ratio: 1.2 ratio (ref 0.0–3.2)
Triglycerides: 80 mg/dL (ref 0–149)
VLDL Cholesterol Cal: 14 mg/dL (ref 5–40)

## 2020-02-20 MED ORDER — GABAPENTIN 100 MG PO CAPS: 100.0000 mg | ORAL_CAPSULE | Freq: Every day | ORAL | 2 refills | Status: DC

## 2020-02-20 NOTE — Progress Notes (Signed)
Her labs look great. Her renal function is a little on the low side, so she should make sure she is drinking more water.

## 2020-02-20 NOTE — Progress Notes (Signed)
Established Patient Office Visit  Subjective:  Patient ID: Sandra Williams, female    DOB: Aug 21, 1925  Age: 87 y.o. MRN: [id number]  CC:  Chief Complaint  Patient presents with  . Hypertension    Follow up, go over labs     HPI Sandra Williams presents for follow up of HTN.  BP is well-controlled. Takes medications regularly. Patient denies headache, dizziness, chest pain, dyspnea or palpitations.  She has brought her medications today. Losartan and Amlodipine 5 mg have also been noticed in it. Patient states that she has not been taking those, but daughter is not sure whether she is taking those or now. I have advised them to discard old medications to avoid any confusion regarding the medications.  She also has been taking Alendronate for osteoporosis, which has been added in the EMR.  She continues to have b/l hand numbness and tingling. But she has not been taking Gabapentin as prescribed. Has not used wrist splint.  Past Medical History:  Diagnosis Date  . Arthritis   . ARTHRITIS 10/07/2006   Qualifier: Diagnosis of  By: Jonna Munro MD, Roderic Scarce    . Cancer ([place])    cecal carcinoma  . Chest pain 10/22/2012  . Colon cancer ([place]) 02/01/2012   Stage I (T2, N0, M0) cancer the cecum status post resection by Dr. Arnoldo Morale with 14 lymph nodes found all of which were negative. She had no evidence for metastatic disease on CT scan of the abdomen and pelvis either. Her date of surgery was 10/22/2011.  Not in need of chemotherapy for her stage I cancer.   Marland Kitchen GERD (gastroesophageal reflux disease)   . HTN (hypertension)   . Hyperlipidemia   . Iron deficiency anemia 02/01/2012   At time of colon cancer presentation  . Knee joint pain 2014   right  . Lipoma of right forearm s/p excision 07/06/2018   . OA (osteoarthritis) of knee 06/27/2012  . Osteoporosis   . Rotator cuff tear arthropathy 12/11/2013  . Small bowel obstruction ([place]) 09/14/2017    Past Surgical History:  Procedure  Laterality Date  . ABDOMINAL HYSTERECTOMY     partial  . APPENDECTOMY     [facility]  . CATARACT EXTRACTION Bilateral   . CESAREAN SECTION    . COLON SURGERY    . dental implant    . LIPOMA EXCISION Right 07/06/2018   Procedure: EXCISION LIPOMA RIGHT FOREARM;  Surgeon: Carole Civil, MD;  Location: [facility];  Service: Orthopedics;  Laterality: Right;  . PARTIAL COLECTOMY  10/22/2011   Procedure: PARTIAL COLECTOMY;  Surgeon: Jamesetta So, MD;  Location: [facility];  Service: General;  Laterality: N/A;  . TOTAL KNEE ARTHROPLASTY Right 01/30/2014   Procedure: TOTAL KNEE ARTHROPLASTY;  Surgeon: Carole Civil, MD;  Location: [facility];  Service: Orthopedics;  Laterality: Right;    Family History  Problem Relation Age of Onset  . Hypertension Mother   . Cancer Sister   . Breast cancer Daughter   . Hypertension Daughter   . Colon cancer Neg Hx   . Liver disease Neg Hx     Social History   Socioeconomic History  . Marital status: Single    Spouse name: Not on file  . Number of children: 3  . Years of education: Not on file  . Highest education level: Not on file  Occupational History  . Not on file  Tobacco Use  . Smoking status: Never Smoker  . Smokeless tobacco: Never Used  Vaping  Use  . Vaping Use: Never used  Substance and Sexual Activity  . Alcohol use: No  . Drug use: No  . Sexual activity: Never    Birth control/protection: Surgical  Other Topics Concern  . Not on file  Social History Narrative   Lives with sister in [place], [place]- son of sister also lives there      Enjoy: sits on the porch- people watching, use to like bingo, walks around house a lot      Diet: eats all food groups   Caffeine: coffee- 3-4 days a week, some tea, drinks coke 3 times a week    Water:  4-5 cups roughly      Wears seat belt   No longer drives   Smoke detectors at home   No weapons she knows of    Social Determinants of Health   Financial Resource Strain: Low Risk    . Difficulty of Paying Living Expenses: Not hard at all  Food Insecurity: No Food Insecurity  . Worried About Charity fundraiser in the Last Year: Never true  . Ran Out of Food in the Last Year: Never true  Transportation Needs: No Transportation Needs  . Lack of Transportation (Medical): No  . Lack of Transportation (Non-Medical): No  Physical Activity: Insufficiently Active  . Days of Exercise per Week: 5 days  . Minutes of Exercise per Session: 10 min  Stress: No Stress Concern Present  . Feeling of Stress : Only a little  Social Connections: Moderately Isolated  . Frequency of Communication with Friends and Family: More than three times a week  . Frequency of Social Gatherings with Friends and Family: More than three times a week  . Attends Religious Services: 1 to 4 times per year  . Active Member of Clubs or Organizations: No  . Attends Archivist Meetings: Never  . Marital Status: Never married  Intimate Partner Violence: Not At Risk  . Fear of Current or Ex-Partner: No  . Emotionally Abused: No  . Physically Abused: No  . Sexually Abused: No    Outpatient Medications Prior to Visit  Medication Sig Dispense Refill  . alendronate (FOSAMAX) 70 MG tablet Take 70 mg by mouth once a week. Take with a full glass of water on an empty stomach.    Marland Kitchen amLODipine (NORVASC) 10 MG tablet Take 1 tablet (10 mg total) by mouth daily. 30 tablet 1  . aspirin 81 MG EC tablet Take 81 mg by mouth daily.     . hydroxypropyl methylcellulose / hypromellose (ISOPTO TEARS / GONIOVISC) 2.5 % ophthalmic solution Place 1 drop into both eyes 3 (three) times daily as needed for dry eyes (irritation).    . Multiple Vitamin (MULTIVITAMIN WITH MINERALS) TABS tablet Take 1 tablet by mouth daily.    . pravastatin (PRAVACHOL) 40 MG tablet Take 40 mg by mouth every morning.     Marland Kitchen UNABLE TO FIND Med Name: Right hand brace, soft, for carpal tunnel. DX: G56.01 1 Product 0  . gabapentin (NEURONTIN) 100  MG capsule Take 1 capsule (100 mg total) by mouth daily. 30 capsule 2   No facility-administered medications prior to visit.    Allergies  Allergen Reactions  . Aspirin     REACTION: Upset stomach if takes regular and uncoated     ROS Review of Systems  Constitutional: Positive for fatigue. Negative for chills and fever.  HENT: Negative for congestion, sinus pressure, sinus pain  and sore throat.   Eyes: Negative for pain and discharge.  Respiratory: Negative for cough and shortness of breath.   Cardiovascular: Negative for chest pain and palpitations.  Gastrointestinal: Negative for abdominal pain, constipation, diarrhea, nausea and vomiting.  Endocrine: Negative for polydipsia and polyuria.  Genitourinary: Negative for dysuria and hematuria.  Musculoskeletal: Negative for neck pain and neck stiffness.  Skin: Negative for rash.  Neurological: Positive for numbness. Negative for dizziness and weakness.  Psychiatric/Behavioral: Negative for agitation and behavioral problems.      Objective:    Physical Exam Vitals reviewed.  Constitutional:      General: She is not in acute distress.    Appearance: She is not diaphoretic.  HENT:     Head: Normocephalic and atraumatic.     Nose: Nose normal. No congestion.     Mouth/Throat:     Mouth: Mucous membranes are moist.     Pharynx: No posterior oropharyngeal erythema.  Eyes:     General: No scleral icterus.    Extraocular Movements: Extraocular movements intact.     Pupils: Pupils are equal, round, and reactive to light.  Cardiovascular:     Rate and Rhythm: Normal rate and regular rhythm.     Pulses: Normal pulses.     Heart sounds: Normal heart sounds. No murmur heard.   Pulmonary:     Breath sounds: Normal breath sounds. No wheezing or rales.  Abdominal:     Palpations: Abdomen is soft.     Tenderness: There is no abdominal tenderness.  Musculoskeletal:     Cervical back: Neck supple. No tenderness.     Right lower  leg: No edema.     Left lower leg: No edema.  Skin:    General: Skin is warm.     Findings: No rash.  Neurological:     General: No focal deficit present.     Mental Status: She is alert and oriented to person, place, and time.  Psychiatric:        Mood and Affect: Mood normal.        Behavior: Behavior normal.     BP 139/72   Pulse 84   Temp 98.2 F (36.8 C)   Resp 20   Ht 5' (1.524 m)   Wt 132 lb (59.9 kg)   SpO2 96%   BMI 25.78 kg/m  Wt Readings from Last 3 Encounters:  02/20/20 132 lb (59.9 kg)  02/06/20 135 lb (61.2 kg)  11/06/19 137 lb (62.1 kg)     Health Maintenance Due  Topic Date Due  . COVID-19 Vaccine (1) Never done  . PNA vac Low Risk Adult (2 of 2 - PCV13) 10/07/2007    There are no preventive care reminders to display for this patient.  Lab Results  Component Value Date   TSH 1.338 01/12/2012   Lab Results  Component Value Date   WBC 5.7 02/19/2020   HGB 13.5 02/19/2020   HCT 41.2 02/19/2020   MCV 87 02/19/2020   PLT 252 02/19/2020   Lab Results  Component Value Date   NA 144 02/19/2020   K 4.8 02/19/2020   CO2 22 02/19/2020   GLUCOSE 98 02/19/2020   BUN 19 02/19/2020   CREATININE 1.03 (H) 02/19/2020   BILITOT 0.6 02/19/2020   ALKPHOS 56 02/19/2020   AST 20 02/19/2020   ALT 12 02/19/2020   PROT 6.9 02/19/2020   ALBUMIN 4.4 02/19/2020   CALCIUM 9.3 02/19/2020   ANIONGAP 8 07/03/2018  Lab Results  Component Value Date   CHOL 199 02/19/2020   Lab Results  Component Value Date   HDL 86 02/19/2020   Lab Results  Component Value Date   LDLCALC 99 02/19/2020   Lab Results  Component Value Date   TRIG 80 02/19/2020   Lab Results  Component Value Date   CHOLHDL 3.2 Ratio 08/14/2008   No results found for: HGBA1C    Assessment & Plan:   HTN BP Readings from Last 1 Encounters:  02/20/20 139/72   Well-controlled with Amlodipine 10 mg QD Counseled for compliance with the medications Advised low salt diet  Numbness  of the hands Could be carpal tunnel syndrome according to the chart review Wrist splint Gabapentin for neuropathy  Osteoporosis Continue Alendronate   Meds ordered this encounter  Medications  . gabapentin (NEURONTIN) 100 MG capsule    Sig: Take 1 capsule (100 mg total) by mouth daily.    Dispense:  30 capsule    Refill:  2    Follow-up: Return in about 3 months (around 05/19/2020).    Lindell Spar, MD

## 2020-02-20 NOTE — Patient Instructions (Signed)
Please continue taking medications as prescribed.  Please discard old medications.  Please take small, frequent meals and take at least 55 ounces of fluid in a day.

## 2020-02-25 ENCOUNTER — Ambulatory Visit: Payer: Medicare Other | Admitting: Orthopedic Surgery

## 2020-03-03 ENCOUNTER — Other Ambulatory Visit: Payer: Self-pay | Admitting: *Deleted

## 2020-03-03 ENCOUNTER — Telehealth: Payer: Self-pay

## 2020-03-03 MED ORDER — ALENDRONATE SODIUM 70 MG PO TABS: 70.0000 mg | ORAL_TABLET | ORAL | 0 refills | Status: DC

## 2020-03-03 NOTE — Telephone Encounter (Signed)
Patient needing refills for alendronate 70 mg sent to [facility] p# [phone]

## 2020-03-03 NOTE — Telephone Encounter (Signed)
Pt medication sent to pharmacy  

## 2020-03-17 ENCOUNTER — Other Ambulatory Visit: Payer: Self-pay

## 2020-03-17 ENCOUNTER — Encounter: Payer: Self-pay | Admitting: Orthopedic Surgery

## 2020-03-17 ENCOUNTER — Ambulatory Visit (INDEPENDENT_AMBULATORY_CARE_PROVIDER_SITE_OTHER): Payer: Medicare Other | Admitting: Orthopedic Surgery

## 2020-03-17 VITALS — Ht 60.0 in | Wt 136.5 lb

## 2020-03-17 DIAGNOSIS — G5603 Carpal tunnel syndrome, bilateral upper limbs: Secondary | ICD-10-CM

## 2020-03-17 NOTE — Progress Notes (Signed)
Chief Complaint  Patient presents with  . Hand Pain    R/ numbness and tingling for quite awhile now.   87 yo female h/o carpal tunnel syndrome treated with brace and advill. Seemed to do well with that.  She stopped and the symptoms have come back and now are bilateral left worse than right   Past Medical History:  Diagnosis Date  . Arthritis   . ARTHRITIS 10/07/2006   Qualifier: Diagnosis of  By: Jonna Munro MD, Roderic Scarce    . Cancer ([place])    cecal carcinoma  . Chest pain 10/22/2012  . Colon cancer ([place]) 02/01/2012   Stage I (T2, N0, M0) cancer the cecum status post resection by Dr. Arnoldo Morale with 14 lymph nodes found all of which were negative. She had no evidence for metastatic disease on CT scan of the abdomen and pelvis either. Her date of surgery was 10/22/2011.  Not in need of chemotherapy for her stage I cancer.   Marland Kitchen GERD (gastroesophageal reflux disease)   . HTN (hypertension)   . Hyperlipidemia   . Iron deficiency anemia 02/01/2012   At time of colon cancer presentation  . Knee joint pain 2014   right  . Lipoma of right forearm s/p excision 07/06/2018   . OA (osteoarthritis) of knee 06/27/2012  . Osteoporosis   . Rotator cuff tear arthropathy 12/11/2013  . Small bowel obstruction ([place]) 09/14/2017    Ht 5' (1.524 m)   Wt 136 lb 8 oz (61.9 kg)   BMI 26.66 kg/m   Examination of the right and left hand reveal that she does have decreased sensation in the median nerve distribution bilaterally but good capillary refill she has decreased touch   Range of motion is okay  She has some nodular deformity of the joints of the small digits of the hand  Bilateral carpal tunnel syndrome Encounter Diagnosis  Name Primary?  . Bilateral carpal tunnel syndrome Yes     Resume Advil 40 mg twice a day  Resume right wrist splint  Follow-up in 4 weeks

## 2020-04-14 ENCOUNTER — Encounter: Payer: Self-pay | Admitting: Orthopedic Surgery

## 2020-04-14 ENCOUNTER — Other Ambulatory Visit: Payer: Self-pay

## 2020-04-14 ENCOUNTER — Ambulatory Visit: Payer: Medicare Other | Admitting: Orthopedic Surgery

## 2020-04-14 ENCOUNTER — Ambulatory Visit (INDEPENDENT_AMBULATORY_CARE_PROVIDER_SITE_OTHER): Payer: Medicare Other | Admitting: Orthopedic Surgery

## 2020-04-14 VITALS — BP 135/75 | HR 101

## 2020-04-14 DIAGNOSIS — G5601 Carpal tunnel syndrome, right upper limb: Secondary | ICD-10-CM

## 2020-04-14 DIAGNOSIS — G5603 Carpal tunnel syndrome, bilateral upper limbs: Secondary | ICD-10-CM

## 2020-04-14 NOTE — Progress Notes (Signed)
Chief Complaint  Patient presents with  . Hand Pain    Bilateral carpel tunnel, Patient reports that she is doing some better,     87 year old female with carpal tunnel syndrome on the right previously treated successfully with bracing and Advil but symptoms came back now she has bilateral disease left worse than right    She resumed her Advil 400 mg twice a day and resumed her right wrist splint  Sandra Williams is somewhat reluctant to continue with her splinting and Advil regimen she is concerned about her chronic numbness tingling weakness and difficulty with fine motor tasks  She is interested in right carpal tunnel release  Past Medical History:  Diagnosis Date  . Arthritis   . ARTHRITIS 10/07/2006   Qualifier: Diagnosis of  By: Jonna Munro MD, Roderic Scarce    . Cancer ([place])    cecal carcinoma  . Chest pain 10/22/2012  . Colon cancer ([place]) 02/01/2012   Stage I (T2, N0, M0) cancer the cecum status post resection by Dr. Arnoldo Morale with 14 lymph nodes found all of which were negative. She had no evidence for metastatic disease on CT scan of the abdomen and pelvis either. Her date of surgery was 10/22/2011.  Not in need of chemotherapy for her stage I cancer.   Marland Kitchen GERD (gastroesophageal reflux disease)   . HTN (hypertension)   . Hyperlipidemia   . Iron deficiency anemia 02/01/2012   At time of colon cancer presentation  . Knee joint pain 2014   right  . Lipoma of right forearm s/p excision 07/06/2018   . OA (osteoarthritis) of knee 06/27/2012  . Osteoporosis   . Rotator cuff tear arthropathy 12/11/2013  . Small bowel obstruction ([place]) 09/14/2017    Past Surgical History:  Procedure Laterality Date  . ABDOMINAL HYSTERECTOMY     partial  . APPENDECTOMY     [facility]  . CATARACT EXTRACTION Bilateral   . CESAREAN SECTION    . COLON SURGERY    . dental implant    . LIPOMA EXCISION Right 07/06/2018   Procedure: EXCISION LIPOMA RIGHT FOREARM;  Surgeon: Carole Civil, MD;  Location: [facility];   Service: Orthopedics;  Laterality: Right;  . PARTIAL COLECTOMY  10/22/2011   Procedure: PARTIAL COLECTOMY;  Surgeon: Jamesetta So, MD;  Location: [facility];  Service: General;  Laterality: N/A;  . TOTAL KNEE ARTHROPLASTY Right 01/30/2014   Procedure: TOTAL KNEE ARTHROPLASTY;  Surgeon: Carole Civil, MD;  Location: [facility];  Service: Orthopedics;  Laterality: Right;    Family History  Problem Relation Age of Onset  . Hypertension Mother   . Cancer Sister   . Breast cancer Daughter   . Hypertension Daughter   . Colon cancer Neg Hx   . Liver disease Neg Hx      Current Outpatient Medications:  .  alendronate (FOSAMAX) 70 MG tablet, Take 1 tablet (70 mg total) by mouth once a week. Take with a full glass of water on an empty stomach., Disp: 30 tablet, Rfl: 0 .  amLODipine (NORVASC) 10 MG tablet, Take 1 tablet (10 mg total) by mouth daily., Disp: 30 tablet, Rfl: 1 .  aspirin 81 MG EC tablet, Take 81 mg by mouth daily. , Disp: , Rfl:  .  gabapentin (NEURONTIN) 100 MG capsule, Take 1 capsule (100 mg total) by mouth daily., Disp: 30 capsule, Rfl: 2 .  hydroxypropyl methylcellulose / hypromellose (ISOPTO TEARS / GONIOVISC) 2.5 % ophthalmic solution, Place 1 drop into both eyes 3 (three)  times daily as needed for dry eyes (irritation)., Disp: , Rfl:  .  Multiple Vitamin (MULTIVITAMIN WITH MINERALS) TABS tablet, Take 1 tablet by mouth daily., Disp: , Rfl:  .  pravastatin (PRAVACHOL) 40 MG tablet, Take 40 mg by mouth every morning. , Disp: , Rfl:  .  UNABLE TO FIND, Med Name: Right hand brace, soft, for carpal tunnel. DX: G56.01, Disp: 1 Product, Rfl: 0   .ohyse Physical Exam Constitutional:      General: She is not in acute distress.    Appearance: She is well-developed.     Comments: Well developed, well nourished Normal grooming and hygiene     Cardiovascular:     Comments: No peripheral edema Musculoskeletal:     Comments: Right carpal tunnel findings include tenderness over the  carpal tunnel atrophy in the thenar eminence decreased grip strength sensory changes in the thumb index and long finger  Color capillary refill normal  Skin:    General: Skin is warm and dry.  Neurological:     Mental Status: She is alert and oriented to person, place, and time.     Sensory: No sensory deficit.     Coordination: Coordination normal.     Gait: Gait normal.     Deep Tendon Reflexes: Reflexes are normal and symmetric.  Psychiatric:        Mood and Affect: Mood normal.        Behavior: Behavior normal.        Thought Content: Thought content normal.        Judgment: Judgment normal.     Comments: Affect normal     The procedure has been fully reviewed with the patient; The risks and benefits of surgery have been discussed and explained and understood. Alternative treatment has also been reviewed, questions were encouraged and answered. The postoperative plan is also been reviewed.  Patient will be scheduled for right carpal tunnel release   Encounter Diagnoses  Name Primary?  . Carpal tunnel syndrome of right wrist Yes  . Bilateral carpal tunnel syndrome

## 2020-04-14 NOTE — Patient Instructions (Signed)
Continue advil 400 mg twice daily   Wear brace at night

## 2020-04-21 ENCOUNTER — Telehealth: Payer: Self-pay | Admitting: Radiology

## 2020-04-21 ENCOUNTER — Telehealth: Payer: Self-pay | Admitting: Orthopedic Surgery

## 2020-04-21 NOTE — Telephone Encounter (Signed)
Patient called and left a voice message stating that she was to have surgery on the 21st but wants to cancel it.  Would you call and do this for the patient please?  She left her phone number [phone]  Thanks

## 2020-04-21 NOTE — Telephone Encounter (Signed)
-----   Message from Josue Hector sent at 04/21/2020 12:58 PM EDT ----- Marykay Lex,  I spoke with this patient's daughter and she said the patient has changed her mind and does not want to have the surgery.  I advised her to call your office.  Let me know if we are cancelling her.  Thanks,  Hoyle Sauer

## 2020-04-21 NOTE — Telephone Encounter (Addendum)
I will call her tomorrow, I am in [place] filling in today.

## 2020-04-22 NOTE — Telephone Encounter (Signed)
Called left message to advise surgery cancelled let us know if she needs anything else.

## 2020-05-02 ENCOUNTER — Ambulatory Visit: Admit: 2020-05-02 | Payer: Medicare Other | Admitting: Orthopedic Surgery

## 2020-05-02 SURGERY — CARPAL TUNNEL RELEASE
Anesthesia: Choice | Laterality: Right

## 2020-05-05 ENCOUNTER — Other Ambulatory Visit: Payer: Self-pay | Admitting: Nurse Practitioner

## 2020-05-05 ENCOUNTER — Ambulatory Visit: Payer: Medicare Other | Admitting: Orthopedic Surgery

## 2020-05-08 ENCOUNTER — Other Ambulatory Visit: Payer: Self-pay

## 2020-05-08 ENCOUNTER — Ambulatory Visit
Admission: EM | Admit: 2020-05-08 | Discharge: 2020-05-08 | Disposition: A | Discharge status: Home / Self Care | Payer: Medicare Other | Attending: Emergency Medicine | Admitting: Emergency Medicine

## 2020-05-08 DIAGNOSIS — M5441 Lumbago with sciatica, right side: Secondary | ICD-10-CM

## 2020-05-08 MED ORDER — TIZANIDINE HCL 2 MG PO TABS: 2.0000 mg | ORAL_TABLET | Freq: Every day | ORAL | 0 refills | Status: DC

## 2020-05-08 MED ORDER — PREDNISONE 20 MG PO TABS: 20.0000 mg | ORAL_TABLET | Freq: Two times a day (BID) | ORAL | 0 refills | Status: AC

## 2020-05-08 NOTE — ED Triage Notes (Signed)
Pt presents with right hip pain that  She has had chronic pain but came severe this morning

## 2020-05-08 NOTE — ED Provider Notes (Signed)
[facility]   [id number] 05/08/20 Arrival Time: 4  CC: Back PAIN  SUBJECTIVE: History from: patient and family. Sandra Williams is a 87 y.o. female complains of RT low back pain x few days.  Denies a precipitating event or specific injury.  Localizes the pain to the RT low back.  Describes the pain as intermittent and achy in character.  Has tried OTC medications without relief.  Symptoms are made worse with movement, improved with rest.  Denies similar symptoms in the past.  Denies fever, chills, erythema, ecchymosis, effusion, weakness, numbness and tingling, saddle paresthesias, loss of bowel or bladder function.      ROS: As per HPI.  All other pertinent ROS negative.     Past Medical History:  Diagnosis Date  . Arthritis   . ARTHRITIS 10/07/2006   Qualifier: Diagnosis of  By: Jonna Munro MD, Roderic Scarce    . Cancer ([place])    cecal carcinoma  . Chest pain 10/22/2012  . Colon cancer ([place]) 02/01/2012   Stage I (T2, N0, M0) cancer the cecum status post resection by Dr. Arnoldo Morale with 14 lymph nodes found all of which were negative. She had no evidence for metastatic disease on CT scan of the abdomen and pelvis either. Her date of surgery was 10/22/2011.  Not in need of chemotherapy for her stage I cancer.   Marland Kitchen GERD (gastroesophageal reflux disease)   . HTN (hypertension)   . Hyperlipidemia   . Iron deficiency anemia 02/01/2012   At time of colon cancer presentation  . Knee joint pain 2014   right  . Lipoma of right forearm s/p excision 07/06/2018   . OA (osteoarthritis) of knee 06/27/2012  . Osteoporosis   . Rotator cuff tear arthropathy 12/11/2013  . Small bowel obstruction ([place]) 09/14/2017   Past Surgical History:  Procedure Laterality Date  . ABDOMINAL HYSTERECTOMY     partial  . APPENDECTOMY     [facility]  . CATARACT EXTRACTION Bilateral   . CESAREAN SECTION    . COLON SURGERY    . dental implant    . LIPOMA EXCISION Right 07/06/2018   Procedure: EXCISION LIPOMA RIGHT  FOREARM;  Surgeon: Carole Civil, MD;  Location: [facility];  Service: Orthopedics;  Laterality: Right;  . PARTIAL COLECTOMY  10/22/2011   Procedure: PARTIAL COLECTOMY;  Surgeon: Jamesetta So, MD;  Location: [facility];  Service: General;  Laterality: N/A;  . TOTAL KNEE ARTHROPLASTY Right 01/30/2014   Procedure: TOTAL KNEE ARTHROPLASTY;  Surgeon: Carole Civil, MD;  Location: [facility];  Service: Orthopedics;  Laterality: Right;   Allergies  Allergen Reactions  . Aspirin     REACTION: Upset stomach if takes regular and uncoated if takes high dose   No current facility-administered medications on file prior to encounter.   Current Outpatient Medications on File Prior to Encounter  Medication Sig Dispense Refill  . alendronate (FOSAMAX) 70 MG tablet Take 1 tablet (70 mg total) by mouth once a week. Take with a full glass of water on an empty stomach. 30 tablet 0  . amLODipine (NORVASC) 10 MG tablet Take 1 tablet by mouth once daily 30 tablet 0  . aspirin 81 MG EC tablet Take 81 mg by mouth daily.     Marland Kitchen gabapentin (NEURONTIN) 100 MG capsule Take 1 capsule (100 mg total) by mouth daily. 30 capsule 2  . hydroxypropyl methylcellulose / hypromellose (ISOPTO TEARS / GONIOVISC) 2.5 % ophthalmic solution Place 1 drop into both eyes 3 (three) times  daily as needed for dry eyes (irritation).    . Multiple Vitamin (MULTIVITAMIN WITH MINERALS) TABS tablet Take 1 tablet by mouth daily.    . pravastatin (PRAVACHOL) 40 MG tablet Take 40 mg by mouth every morning.     Marland Kitchen UNABLE TO FIND Med Name: Right hand brace, soft, for carpal tunnel. DX: G56.01 1 Product 0   Social History   Socioeconomic History  . Marital status: Single    Spouse name: Not on file  . Number of children: 3  . Years of education: Not on file  . Highest education level: Not on file  Occupational History  . Not on file  Tobacco Use  . Smoking status: Never Smoker  . Smokeless tobacco: Never Used  Vaping Use  . Vaping Use:  Never used  Substance and Sexual Activity  . Alcohol use: No  . Drug use: No  . Sexual activity: Never    Birth control/protection: Surgical  Other Topics Concern  . Not on file  Social History Narrative   Lives with sister in [place], [place]- son of sister also lives there      Enjoy: sits on the porch- people watching, use to like bingo, walks around house a lot      Diet: eats all food groups   Caffeine: coffee- 3-4 days a week, some tea, drinks coke 3 times a week    Water:  4-5 cups roughly      Wears seat belt   No longer drives   Smoke detectors at home   No weapons she knows of    Social Determinants of Health   Financial Resource Strain: Low Risk   . Difficulty of Paying Living Expenses: Not hard at all  Food Insecurity: No Food Insecurity  . Worried About Charity fundraiser in the Last Year: Never true  . Ran Out of Food in the Last Year: Never true  Transportation Needs: No Transportation Needs  . Lack of Transportation (Medical): No  . Lack of Transportation (Non-Medical): No  Physical Activity: Insufficiently Active  . Days of Exercise per Week: 5 days  . Minutes of Exercise per Session: 10 min  Stress: No Stress Concern Present  . Feeling of Stress : Only a little  Social Connections: Moderately Isolated  . Frequency of Communication with Friends and Family: More than three times a week  . Frequency of Social Gatherings with Friends and Family: More than three times a week  . Attends Religious Services: 1 to 4 times per year  . Active Member of Clubs or Organizations: No  . Attends Archivist Meetings: Never  . Marital Status: Never married  Intimate Partner Violence: Not At Risk  . Fear of Current or Ex-Partner: No  . Emotionally Abused: No  . Physically Abused: No  . Sexually Abused: No   Family History  Problem Relation Age of Onset  . Hypertension Mother   . Cancer Sister   . Breast cancer Daughter   . Hypertension Daughter    . Colon cancer Neg Hx   . Liver disease Neg Hx     OBJECTIVE:  Vitals:   05/08/20 1434  BP: 129/63  Pulse: 83  Resp: 20  Temp: 98.7 F (37.1 C)  SpO2: 94%    General appearance: ALERT; in no acute distress.  Head: NCAT Lungs: Normal respiratory effort Musculoskeletal: Back Inspection: Skin warm, dry, clear and intact without obvious erythema, effusion, or ecchymosis.  Palpation:  TTP over RT low back ROM: FROM active and passive Strength: 5/5 hip flexion, 5/5 hip extension Skin: warm and dry Neurologic: Ambulates with a cane Psychological: alert and cooperative; normal mood and affect   ASSESSMENT & PLAN:  1. Acute right-sided low back pain with right-sided sciatica     @[facility] @  Meds ordered this encounter  Medications  . predniSONE (DELTASONE) 20 MG tablet    Sig: Take 1 tablet (20 mg total) by mouth 2 (two) times daily with a meal for 5 days.    Dispense:  10 tablet    Refill:  0    Order Specific Question:   Supervising Provider    Answer:   Raylene Everts [[id number]]  . tiZANidine (ZANAFLEX) 2 MG tablet    Sig: Take 1 tablet (2 mg total) by mouth at bedtime.    Dispense:  20 tablet    Refill:  0    Order Specific Question:   Supervising Provider    Answer:   Raylene Everts [[id number]]    Continue conservative management of rest, ice, and gentle stretches Prednisone prescribed.  Take as directed and to completion Take zanaflex at nighttime for symptomatic relief. Avoid driving or operating heavy machinery while using medication. Follow up with PCP if symptoms persist Return or go to the ER if you have any new or worsening symptoms (fever, chills, chest pain, abdominal pain, changes in bowel or bladder habits, pain radiating into lower legs, etc...)   Reviewed expectations re: course of current medical issues. Questions answered. Outlined signs and symptoms indicating need for more acute intervention. Patient verbalized understanding. After Visit  Summary given.    Lestine Box, PA-C 05/08/20 1517

## 2020-05-08 NOTE — Discharge Instructions (Signed)
Continue conservative management of rest, ice, and gentle stretches Prednisone prescribed.  Take as directed and to completion Take zanaflex at nighttime for symptomatic relief. Avoid driving or operating heavy machinery while using medication. Follow up with PCP if symptoms persist Return or go to the ER if you have any new or worsening symptoms (fever, chills, chest pain, abdominal pain, changes in bowel or bladder habits, pain radiating into lower legs, etc...)  

## 2020-05-22 ENCOUNTER — Other Ambulatory Visit: Payer: Self-pay | Admitting: Internal Medicine

## 2020-05-22 ENCOUNTER — Other Ambulatory Visit: Payer: Self-pay | Admitting: Nurse Practitioner

## 2020-05-22 ENCOUNTER — Ambulatory Visit: Payer: Medicare Other | Admitting: Nurse Practitioner

## 2020-05-22 MED ORDER — ALENDRONATE SODIUM 70 MG PO TABS: 70.0000 mg | ORAL_TABLET | ORAL | 0 refills | Status: DC

## 2020-06-03 ENCOUNTER — Ambulatory Visit (INDEPENDENT_AMBULATORY_CARE_PROVIDER_SITE_OTHER): Payer: Medicare Other | Admitting: Nurse Practitioner

## 2020-06-03 ENCOUNTER — Encounter: Payer: Self-pay | Admitting: Nurse Practitioner

## 2020-06-03 ENCOUNTER — Other Ambulatory Visit: Payer: Self-pay

## 2020-06-03 DIAGNOSIS — E785 Hyperlipidemia, unspecified: Secondary | ICD-10-CM

## 2020-06-03 DIAGNOSIS — G5601 Carpal tunnel syndrome, right upper limb: Secondary | ICD-10-CM

## 2020-06-03 DIAGNOSIS — M810 Age-related osteoporosis without current pathological fracture: Secondary | ICD-10-CM

## 2020-06-03 DIAGNOSIS — I10 Essential (primary) hypertension: Secondary | ICD-10-CM | POA: Diagnosis not present

## 2020-06-03 DIAGNOSIS — M5431 Sciatica, right side: Secondary | ICD-10-CM | POA: Diagnosis not present

## 2020-06-03 MED ORDER — TIZANIDINE HCL 2 MG PO TABS: 2.0000 mg | ORAL_TABLET | Freq: Four times a day (QID) | ORAL | 0 refills | Status: DC | PRN

## 2020-06-03 MED ORDER — PREDNISONE 10 MG PO TABS: ORAL_TABLET | ORAL | 0 refills | Status: AC

## 2020-06-03 NOTE — Progress Notes (Signed)
Acute Office Visit  Subjective:    Patient ID: Sandra Williams, female    DOB: 07-13-1925, 87 y.o.   MRN: [id number]  Chief Complaint  Patient presents with  . Hypertension    HPI Patient is in today for lab follow-up and BP check. At her last visit, we had increased her amlodipine to 10 mg daily, and her BP came down to 139/72.  She doesn't check her BP at home, but denies adverse medication effects.  She didn't have labs drawn prior to this visit.  She is having back pain that is worse when she gets up and walks around. She has tried taking aspirin and advil, but this isn't helping much.  She has tried using massage as well, but that isn't helping much.She rates her pain at 9-10/10 and it radiates into her right leg.    Past Medical History:  Diagnosis Date  . Arthritis   . ARTHRITIS 10/07/2006   Qualifier: Diagnosis of  By: Jonna Munro MD, Roderic Scarce    . Cancer ([place])    cecal carcinoma  . Chest pain 10/22/2012  . Colon cancer ([place]) 02/01/2012   Stage I (T2, N0, M0) cancer the cecum status post resection by Dr. Arnoldo Morale with 14 lymph nodes found all of which were negative. She had no evidence for metastatic disease on CT scan of the abdomen and pelvis either. Her date of surgery was 10/22/2011.  Not in need of chemotherapy for her stage I cancer.   Marland Kitchen GERD (gastroesophageal reflux disease)   . HTN (hypertension)   . Hyperlipidemia   . Iron deficiency anemia 02/01/2012   At time of colon cancer presentation  . Knee joint pain 2014   right  . Lipoma of right forearm s/p excision 07/06/2018   . OA (osteoarthritis) of knee 06/27/2012  . Osteoporosis   . Rotator cuff tear arthropathy 12/11/2013  . Small bowel obstruction ([place]) 09/14/2017    Past Surgical History:  Procedure Laterality Date  . ABDOMINAL HYSTERECTOMY     partial  . APPENDECTOMY     [facility]  . CATARACT EXTRACTION Bilateral   . CESAREAN SECTION    . COLON SURGERY    . dental implant    . LIPOMA EXCISION Right  07/06/2018   Procedure: EXCISION LIPOMA RIGHT FOREARM;  Surgeon: Carole Civil, MD;  Location: [facility];  Service: Orthopedics;  Laterality: Right;  . PARTIAL COLECTOMY  10/22/2011   Procedure: PARTIAL COLECTOMY;  Surgeon: Jamesetta So, MD;  Location: [facility];  Service: General;  Laterality: N/A;  . TOTAL KNEE ARTHROPLASTY Right 01/30/2014   Procedure: TOTAL KNEE ARTHROPLASTY;  Surgeon: Carole Civil, MD;  Location: [facility];  Service: Orthopedics;  Laterality: Right;    Family History  Problem Relation Age of Onset  . Hypertension Mother   . Cancer Sister   . Breast cancer Daughter   . Hypertension Daughter   . Colon cancer Neg Hx   . Liver disease Neg Hx     Social History   Socioeconomic History  . Marital status: Single    Spouse name: Not on file  . Number of children: 3  . Years of education: Not on file  . Highest education level: Not on file  Occupational History  . Not on file  Tobacco Use  . Smoking status: Never Smoker  . Smokeless tobacco: Never Used  Vaping Use  . Vaping Use: Never used  Substance and Sexual Activity  . Alcohol use: No  . Drug  use: No  . Sexual activity: Never    Birth control/protection: Surgical  Other Topics Concern  . Not on file  Social History Narrative   Lives with sister in [place], [place]- son of sister also lives there      Enjoy: sits on the porch- people watching, use to like bingo, walks around house a lot      Diet: eats all food groups   Caffeine: coffee- 3-4 days a week, some tea, drinks coke 3 times a week    Water:  4-5 cups roughly      Wears seat belt   No longer drives   Smoke detectors at home   No weapons she knows of    Social Determinants of Health   Financial Resource Strain: Low Risk   . Difficulty of Paying Living Expenses: Not hard at all  Food Insecurity: No Food Insecurity  . Worried About Charity fundraiser in the Last Year: Never true  . Ran Out of Food in the Last Year: Never  true  Transportation Needs: No Transportation Needs  . Lack of Transportation (Medical): No  . Lack of Transportation (Non-Medical): No  Physical Activity: Insufficiently Active  . Days of Exercise per Week: 5 days  . Minutes of Exercise per Session: 10 min  Stress: No Stress Concern Present  . Feeling of Stress : Only a little  Social Connections: Moderately Isolated  . Frequency of Communication with Friends and Family: More than three times a week  . Frequency of Social Gatherings with Friends and Family: More than three times a week  . Attends Religious Services: 1 to 4 times per year  . Active Member of Clubs or Organizations: No  . Attends Archivist Meetings: Never  . Marital Status: Never married  Intimate Partner Violence: Not At Risk  . Fear of Current or Ex-Partner: No  . Emotionally Abused: No  . Physically Abused: No  . Sexually Abused: No    Outpatient Medications Prior to Visit  Medication Sig Dispense Refill  . alendronate (FOSAMAX) 70 MG tablet Take 1 tablet (70 mg total) by mouth once a week. Take with a full glass of water on an empty stomach. 30 tablet 0  . amLODipine (NORVASC) 10 MG tablet Take 1 tablet by mouth once daily 30 tablet 0  . aspirin 81 MG EC tablet Take 81 mg by mouth daily.     Marland Kitchen gabapentin (NEURONTIN) 100 MG capsule Take 1 capsule (100 mg total) by mouth daily. 30 capsule 2  . Multiple Vitamin (MULTIVITAMIN WITH MINERALS) TABS tablet Take 1 tablet by mouth daily.    . pravastatin (PRAVACHOL) 40 MG tablet Take 40 mg by mouth every morning.     Marland Kitchen UNABLE TO FIND Med Name: Right hand brace, soft, for carpal tunnel. DX: G56.01 1 Product 0  . hydroxypropyl methylcellulose / hypromellose (ISOPTO TEARS / GONIOVISC) 2.5 % ophthalmic solution Place 1 drop into both eyes 3 (three) times daily as needed for dry eyes (irritation).    Marland Kitchen tiZANidine (ZANAFLEX) 2 MG tablet Take 1 tablet (2 mg total) by mouth at bedtime. 20 tablet 0   No  facility-administered medications prior to visit.    Allergies  Allergen Reactions  . Aspirin     REACTION: Upset stomach if takes regular and uncoated if takes high dose    Review of Systems  Constitutional: Negative.   Respiratory: Negative.   Cardiovascular: Negative.   Musculoskeletal: Positive  for back pain.       Radiates down right leg  Psychiatric/Behavioral: Negative.        Objective:    Physical Exam Constitutional:      Appearance: Normal appearance.  Cardiovascular:     Rate and Rhythm: Normal rate and regular rhythm.     Pulses: Normal pulses.     Heart sounds: Normal heart sounds.  Pulmonary:     Effort: Pulmonary effort is normal.     Breath sounds: Normal breath sounds.  Musculoskeletal:        General: Tenderness present.     Comments: Lower back midline tenderness; negative straight leg raise from seated position; deferred supine leg raise d/t mobility issues  Neurological:     Mental Status: She is alert.  Psychiatric:        Mood and Affect: Mood normal.        Behavior: Behavior normal.        Thought Content: Thought content normal.        Judgment: Judgment normal.     BP (!) 163/64   Pulse 80   Temp 98.4 F (36.9 C)   Resp 18   Ht 5' (1.524 m)   Wt 132 lb (59.9 kg)   SpO2 94%   BMI 25.78 kg/m  Wt Readings from Last 3 Encounters:  06/03/20 132 lb (59.9 kg)  03/17/20 136 lb 8 oz (61.9 kg)  02/20/20 132 lb (59.9 kg)    Health Maintenance Due  Topic Date Due  . COVID-19 Vaccine (1) Never done  . Zoster Vaccines- Shingrix (1 of 2) Never done  . PNA vac Low Risk Adult (2 of 2 - PCV13) 10/07/2007    There are no preventive care reminders to display for this patient.   Lab Results  Component Value Date   TSH 1.338 01/12/2012   Lab Results  Component Value Date   WBC 5.7 02/19/2020   HGB 13.5 02/19/2020   HCT 41.2 02/19/2020   MCV 87 02/19/2020   PLT 252 02/19/2020   Lab Results  Component Value Date   NA 144  02/19/2020   K 4.8 02/19/2020   CO2 22 02/19/2020   GLUCOSE 98 02/19/2020   BUN 19 02/19/2020   CREATININE 1.03 (H) 02/19/2020   BILITOT 0.6 02/19/2020   ALKPHOS 56 02/19/2020   AST 20 02/19/2020   ALT 12 02/19/2020   PROT 6.9 02/19/2020   ALBUMIN 4.4 02/19/2020   CALCIUM 9.3 02/19/2020   ANIONGAP 8 07/03/2018   Lab Results  Component Value Date   CHOL 199 02/19/2020   Lab Results  Component Value Date   HDL 86 02/19/2020   Lab Results  Component Value Date   LDLCALC 99 02/19/2020   Lab Results  Component Value Date   TRIG 80 02/19/2020   Lab Results  Component Value Date   CHOLHDL 3.2 Ratio 08/14/2008   No results found for: HGBA1C     Assessment & Plan:   Problem List Items Addressed This Visit      Cardiovascular and Mediastinum   HYPERTENSION, BENIGN ESSENTIAL    BP Readings from Last 3 Encounters:  06/03/20 (!) 163/64  05/08/20 129/63  04/14/20 135/75  -Continue amlodipine -no change in her BP meds since last BPs have been great, and she is having back pain today      Relevant Orders   CBC with Differential/Platelet   CMP14+EGFR   Lipid Panel With LDL/HDL Ratio     Nervous  and Auditory   Right sided sciatica    -has hx of lumbar stenosis -no incontinence or saddle anesthesia -Rx. Prednisone -Refilled tizanidine -she takes OTC motrin      Relevant Medications   tiZANidine (ZANAFLEX) 2 MG tablet   Carpal tunnel syndrome of right wrist    -followed by Dr. Aline Brochure      Relevant Medications   tiZANidine (ZANAFLEX) 2 MG tablet     Musculoskeletal and Integument   Osteoporosis of multiple sites    -taking alendronate -will check Vit D      Relevant Orders   VITAMIN D 25 Hydroxy (Vit-D Deficiency, Fractures)     Other   HLD (hyperlipidemia)    -labs ordered       Relevant Orders   CBC with Differential/Platelet   CMP14+EGFR   Lipid Panel With LDL/HDL Ratio       Meds ordered this encounter  Medications  . tiZANidine  (ZANAFLEX) 2 MG tablet    Sig: Take 1 tablet (2 mg total) by mouth every 6 (six) hours as needed for muscle spasms.    Dispense:  30 tablet    Refill:  0  . predniSONE (DELTASONE) 10 MG tablet    Sig: Take 6 tablets (60 mg total) by mouth daily with breakfast for 1 day, THEN 5 tablets (50 mg total) daily with breakfast for 1 day, THEN 4 tablets (40 mg total) daily with breakfast for 1 day, THEN 3 tablets (30 mg total) daily with breakfast for 1 day, THEN 2 tablets (20 mg total) daily with breakfast for 1 day, THEN 1 tablet (10 mg total) daily with breakfast for 1 day.    Dispense:  21 tablet    Refill:  0     Noreene Larsson, NP

## 2020-06-03 NOTE — Assessment & Plan Note (Signed)
labs ordered

## 2020-06-03 NOTE — Patient Instructions (Signed)
Please have fasting labs drawn today. 

## 2020-06-03 NOTE — Assessment & Plan Note (Addendum)
BP Readings from Last 3 Encounters:  06/03/20 (!) 163/64  05/08/20 129/63  04/14/20 135/75  -Continue amlodipine -no change in her BP meds since last BPs have been great, and she is having back pain today

## 2020-06-03 NOTE — Assessment & Plan Note (Addendum)
-  has hx of lumbar stenosis -no incontinence or saddle anesthesia -Rx. Prednisone -Refilled tizanidine -she takes OTC motrin

## 2020-06-03 NOTE — Assessment & Plan Note (Signed)
-  followed by Dr. Aline Brochure

## 2020-06-03 NOTE — Assessment & Plan Note (Signed)
-  taking alendronate -will check Vit D

## 2020-06-04 DIAGNOSIS — E785 Hyperlipidemia, unspecified: Secondary | ICD-10-CM | POA: Diagnosis not present

## 2020-06-04 DIAGNOSIS — M810 Age-related osteoporosis without current pathological fracture: Secondary | ICD-10-CM | POA: Diagnosis not present

## 2020-06-04 DIAGNOSIS — I10 Essential (primary) hypertension: Secondary | ICD-10-CM | POA: Diagnosis not present

## 2020-06-05 ENCOUNTER — Other Ambulatory Visit: Payer: Self-pay | Admitting: Nurse Practitioner

## 2020-06-05 DIAGNOSIS — E785 Hyperlipidemia, unspecified: Secondary | ICD-10-CM

## 2020-06-05 LAB — CMP14+EGFR
ALT: 12 IU/L (ref 0–32)
AST: 21 IU/L (ref 0–40)
Albumin/Globulin Ratio: 1.4 (ref 1.2–2.2)
Albumin: 4 g/dL (ref 3.5–4.6)
Alkaline Phosphatase: 60 IU/L (ref 44–121)
BUN/Creatinine Ratio: 18 (ref 12–28)
BUN: 14 mg/dL (ref 10–36)
Bilirubin Total: 0.5 mg/dL (ref 0.0–1.2)
CO2: 21 mmol/L (ref 20–29)
Calcium: 9.3 mg/dL (ref 8.7–10.3)
Chloride: 110 mmol/L — ABNORMAL HIGH (ref 96–106)
Creatinine, Ser: 0.78 mg/dL (ref 0.57–1.00)
Globulin, Total: 2.9 g/dL (ref 1.5–4.5)
Glucose: 90 mg/dL (ref 65–99)
Potassium: 4.6 mmol/L (ref 3.5–5.2)
Sodium: 148 mmol/L — ABNORMAL HIGH (ref 134–144)
Total Protein: 6.9 g/dL (ref 6.0–8.5)
eGFR: 71 mL/min/{1.73_m2} (ref >59)

## 2020-06-05 LAB — CBC WITH DIFFERENTIAL/PLATELET
Basophils Absolute: 0 10*3/uL (ref 0.0–0.2)
Basos: 1 %
EOS (ABSOLUTE): 0.3 10*3/uL (ref 0.0–0.4)
Eos: 5 %
Hematocrit: 38.1 % (ref 34.0–46.6)
Hemoglobin: 12 g/dL (ref 11.1–15.9)
Immature Grans (Abs): 0 10*3/uL (ref 0.0–0.1)
Immature Granulocytes: 0 %
Lymphocytes Absolute: 1.8 10*3/uL (ref 0.7–3.1)
Lymphs: 37 %
MCH: 28 pg (ref 26.6–33.0)
MCHC: 31.5 g/dL (ref 31.5–35.7)
MCV: 89 fL (ref 79–97)
Monocytes Absolute: 0.5 10*3/uL (ref 0.1–0.9)
Monocytes: 9 %
Neutrophils Absolute: 2.4 10*3/uL (ref 1.4–7.0)
Neutrophils: 48 %
Platelets: 318 10*3/uL (ref 150–450)
RBC: 4.29 x10E6/uL (ref 3.77–5.28)
RDW: 14 % (ref 11.7–15.4)
WBC: 5 10*3/uL (ref 3.4–10.8)

## 2020-06-05 LAB — VITAMIN D 25 HYDROXY (VIT D DEFICIENCY, FRACTURES): Vit D, 25-Hydroxy: 21.6 ng/mL — ABNORMAL LOW (ref 30.0–100.0)

## 2020-06-05 LAB — LIPID PANEL WITH LDL/HDL RATIO
Cholesterol, Total: 261 mg/dL — ABNORMAL HIGH (ref 100–199)
HDL: 88 mg/dL (ref >39)
LDL Chol Calc (NIH): 154 mg/dL — ABNORMAL HIGH (ref 0–99)
LDL/HDL Ratio: 1.8 ratio (ref 0.0–3.2)
Triglycerides: 109 mg/dL (ref 0–149)
VLDL Cholesterol Cal: 19 mg/dL (ref 5–40)

## 2020-06-05 MED ORDER — PRAVASTATIN SODIUM 80 MG PO TABS: 80.0000 mg | ORAL_TABLET | Freq: Every day | ORAL | 3 refills | Status: DC

## 2020-06-05 NOTE — Progress Notes (Signed)
Her cholesterol is elevated, so I increased her dose of pravastatin to 80 mg.  Her sodium was a little elevated, so she should increase hydration by drinking more water.

## 2020-06-09 ENCOUNTER — Telehealth: Payer: Self-pay

## 2020-06-09 NOTE — Telephone Encounter (Signed)
LVM for pt to call the office regarding lab results

## 2020-06-09 NOTE — Telephone Encounter (Signed)
Daughter returning call for lab results. Call back # [phone]

## 2020-06-10 NOTE — Telephone Encounter (Signed)
Pt daughter [name] notified of lab results with verbal understanding

## 2020-06-10 NOTE — Telephone Encounter (Signed)
Please call the number listed [phone]  To [name] the Daughter

## 2020-06-20 ENCOUNTER — Other Ambulatory Visit: Payer: Self-pay | Admitting: Nurse Practitioner

## 2020-06-23 ENCOUNTER — Other Ambulatory Visit: Payer: Self-pay

## 2020-06-23 ENCOUNTER — Telehealth: Payer: Self-pay

## 2020-06-23 DIAGNOSIS — I10 Essential (primary) hypertension: Secondary | ICD-10-CM

## 2020-06-23 MED ORDER — AMLODIPINE BESYLATE 10 MG PO TABS: 1.0000 | ORAL_TABLET | Freq: Every day | ORAL | 3 refills | Status: DC

## 2020-06-23 NOTE — Telephone Encounter (Signed)
Patient needing refill on amlodipine ph# [phone]

## 2020-06-23 NOTE — Telephone Encounter (Signed)
Rx refill sent.

## 2020-07-03 ENCOUNTER — Encounter: Payer: Self-pay | Admitting: Nurse Practitioner

## 2020-07-03 ENCOUNTER — Other Ambulatory Visit: Payer: Self-pay

## 2020-07-03 ENCOUNTER — Ambulatory Visit (INDEPENDENT_AMBULATORY_CARE_PROVIDER_SITE_OTHER): Payer: Medicare Other | Admitting: Nurse Practitioner

## 2020-07-03 VITALS — BP 146/69 | HR 80 | Temp 97.5°F | Ht 63.0 in | Wt 134.0 lb

## 2020-07-03 DIAGNOSIS — M545 Low back pain, unspecified: Secondary | ICD-10-CM | POA: Diagnosis not present

## 2020-07-03 DIAGNOSIS — D509 Iron deficiency anemia, unspecified: Secondary | ICD-10-CM

## 2020-07-03 DIAGNOSIS — I10 Essential (primary) hypertension: Secondary | ICD-10-CM | POA: Diagnosis not present

## 2020-07-03 DIAGNOSIS — G5601 Carpal tunnel syndrome, right upper limb: Secondary | ICD-10-CM

## 2020-07-03 DIAGNOSIS — E785 Hyperlipidemia, unspecified: Secondary | ICD-10-CM

## 2020-07-03 MED ORDER — TIZANIDINE HCL 2 MG PO TABS: 2.0000 mg | ORAL_TABLET | Freq: Four times a day (QID) | ORAL | 0 refills | Status: DC | PRN

## 2020-07-03 MED ORDER — GABAPENTIN 100 MG PO CAPS: 100.0000 mg | ORAL_CAPSULE | Freq: Every day | ORAL | 2 refills | Status: DC

## 2020-07-03 NOTE — Assessment & Plan Note (Signed)
-  refilled tizanidine; she states her back felt better while she was taking it

## 2020-07-03 NOTE — Assessment & Plan Note (Signed)
BP Readings from Last 3 Encounters:  07/03/20 (!) 146/69  06/03/20 (!) 163/64  05/08/20 129/63   -continue amlodipine -will treat her pain, expect BP to return to < 140/90

## 2020-07-03 NOTE — Assessment & Plan Note (Signed)
-  refill gabapentin

## 2020-07-03 NOTE — Progress Notes (Signed)
Established Patient Office Visit  Subjective:  Patient ID: Sandra Williams, female    DOB: [birth year]  Age: 87 y.o. MRN: [id number]  CC:  Chief Complaint  Patient presents with   Hypertension    Follow up    HPI Sandra Williams presents for BP check.  At her last OV, her BP was 163/64, but all of her previous BP checks had been fine. We chose watchful waiting and to continue her current amlodipine dose.  Denies adverse medication effects.  She states that her right hand and lower back have been hurting. Her back pain is worse on her right side. She takes tizanidine and gabapentin for pain, but she has been out of those.  Past Medical History:  Diagnosis Date   Arthritis    ARTHRITIS 10/07/2006   Qualifier: Diagnosis of  By: Jonna Munro MD, Cornelius     Cancer [facility])    cecal carcinoma   Chest pain 10/22/2012   Colon cancer ([place]) 02/01/2012   Stage I (T2, N0, M0) cancer the cecum status post resection by Dr. Arnoldo Morale with 14 lymph nodes found all of which were negative. She had no evidence for metastatic disease on CT scan of the abdomen and pelvis either. Her date of surgery was 10/22/2011.  Not in need of chemotherapy for her stage I cancer.    GERD (gastroesophageal reflux disease)    HTN (hypertension)    Hyperlipidemia    Iron deficiency anemia 02/01/2012   At time of colon cancer presentation   Knee joint pain 2014   right   Lipoma of right forearm s/p excision 07/06/2018    OA (osteoarthritis) of knee 06/27/2012   Osteoporosis    Rotator cuff tear arthropathy 12/11/2013   Small bowel obstruction ([place]) 09/14/2017    Past Surgical History:  Procedure Laterality Date   ABDOMINAL HYSTERECTOMY     partial   APPENDECTOMY     APH   CATARACT EXTRACTION Bilateral    CESAREAN SECTION     COLON SURGERY     dental implant     LIPOMA EXCISION Right 07/06/2018   Procedure: EXCISION LIPOMA RIGHT FOREARM;  Surgeon: Carole Civil, MD;  Location: [facility];  Service:  Orthopedics;  Laterality: Right;   PARTIAL COLECTOMY  10/22/2011   Procedure: PARTIAL COLECTOMY;  Surgeon: Jamesetta So, MD;  Location: [facility];  Service: General;  Laterality: N/A;   TOTAL KNEE ARTHROPLASTY Right 01/30/2014   Procedure: TOTAL KNEE ARTHROPLASTY;  Surgeon: Carole Civil, MD;  Location: [facility];  Service: Orthopedics;  Laterality: Right;    Family History  Problem Relation Age of Onset   Hypertension Mother    Cancer Sister    Breast cancer Daughter    Hypertension Daughter    Colon cancer Neg Hx    Liver disease Neg Hx     Social History   Socioeconomic History   Marital status: Single    Spouse name: Not on file   Number of children: 3   Years of education: Not on file   Highest education level: Not on file  Occupational History   Not on file  Tobacco Use   Smoking status: Never   Smokeless tobacco: Never  Vaping Use   Vaping Use: Never used  Substance and Sexual Activity   Alcohol use: No   Drug use: No   Sexual activity: Never    Birth control/protection: Surgical  Other Topics Concern   Not on file  Social History Narrative  Lives with sister in [place], [place]- son of sister also lives there      Enjoy: sits on the porch- people watching, use to like bingo, walks around house a lot      Diet: eats all food groups   Caffeine: coffee- 3-4 days a week, some tea, drinks coke 3 times a week    Water:  4-5 cups roughly      Wears seat belt   No longer drives   Smoke detectors at home   No weapons she knows of    Social Determinants of Radio broadcast assistant Strain: Low Risk    Difficulty of Paying Living Expenses: Not hard at all  Food Insecurity: No Food Insecurity   Worried About Charity fundraiser in the Last Year: Never true   Arboriculturist in the Last Year: Never true  Transportation Needs: No Transportation Needs   Lack of Transportation (Medical): No   Lack of Transportation (Non-Medical): No  Physical  Activity: Insufficiently Active   Days of Exercise per Week: 5 days   Minutes of Exercise per Session: 10 min  Stress: No Stress Concern Present   Feeling of Stress : Only a little  Social Connections: Moderately Isolated   Frequency of Communication with Friends and Family: More than three times a week   Frequency of Social Gatherings with Friends and Family: More than three times a week   Attends Religious Services: 1 to 4 times per year   Active Member of [employer] or Organizations: No   Attends Archivist Meetings: Never   Marital Status: Never married  Human resources officer Violence: Not At Risk   Fear of Current or Ex-Partner: No   Emotionally Abused: No   Physically Abused: No   Sexually Abused: No    Outpatient Medications Prior to Visit  Medication Sig Dispense Refill   alendronate (FOSAMAX) 70 MG tablet Take 1 tablet (70 mg total) by mouth once a week. Take with a full glass of water on an empty stomach. 30 tablet 0   amLODipine (NORVASC) 10 MG tablet Take 1 tablet (10 mg total) by mouth daily. 30 tablet 3   aspirin 81 MG EC tablet Take 81 mg by mouth daily.      ferrous sulfate 325 (65 FE) MG tablet Take 325 mg by mouth 2 (two) times daily with a meal.     Multiple Vitamin (MULTIVITAMIN WITH MINERALS) TABS tablet Take 1 tablet by mouth daily.     pravastatin (PRAVACHOL) 80 MG tablet Take 1 tablet (80 mg total) by mouth daily. 90 tablet 3   UNABLE TO FIND Med Name: Right hand brace, soft, for carpal tunnel. DX: G56.01 1 Product 0   gabapentin (NEURONTIN) 100 MG capsule Take 1 capsule (100 mg total) by mouth daily. (Patient not taking: Reported on 07/03/2020) 30 capsule 2   tiZANidine (ZANAFLEX) 2 MG tablet Take 1 tablet (2 mg total) by mouth every 6 (six) hours as needed for muscle spasms. (Patient not taking: Reported on 07/03/2020) 30 tablet 0   No facility-administered medications prior to visit.    Allergies  Allergen Reactions   Aspirin     REACTION: Upset stomach  if takes regular and uncoated if takes high dose    ROS Review of Systems    Objective:    Physical Exam  BP (!) 146/69 (BP Location: Left Arm, Patient Position: Sitting, Cuff Size: Large)   Pulse 80  Temp (!) 97.5 F (36.4 C) (Temporal)   Ht 5' 3"  (1.6 m)   Wt 134 lb (60.8 kg)   SpO2 97%   BMI 23.74 kg/m  Wt Readings from Last 3 Encounters:  07/03/20 134 lb (60.8 kg)  06/03/20 132 lb (59.9 kg)  03/17/20 136 lb 8 oz (61.9 kg)     There are no preventive care reminders to display for this patient.   There are no preventive care reminders to display for this patient.  Lab Results  Component Value Date   TSH 1.338 01/12/2012   Lab Results  Component Value Date   WBC 5.0 06/04/2020   HGB 12.0 06/04/2020   HCT 38.1 06/04/2020   MCV 89 06/04/2020   PLT 318 06/04/2020   Lab Results  Component Value Date   NA 148 (H) 06/04/2020   K 4.6 06/04/2020   CO2 21 06/04/2020   GLUCOSE 90 06/04/2020   BUN 14 06/04/2020   CREATININE 0.78 06/04/2020   BILITOT 0.5 06/04/2020   ALKPHOS 60 06/04/2020   AST 21 06/04/2020   ALT 12 06/04/2020   PROT 6.9 06/04/2020   ALBUMIN 4.0 06/04/2020   CALCIUM 9.3 06/04/2020   ANIONGAP 8 07/03/2018   EGFR 71 06/04/2020   Lab Results  Component Value Date   CHOL 261 (H) 06/04/2020   Lab Results  Component Value Date   HDL 88 06/04/2020   Lab Results  Component Value Date   LDLCALC 154 (H) 06/04/2020   Lab Results  Component Value Date   TRIG 109 06/04/2020   Lab Results  Component Value Date   CHOLHDL 3.2 Ratio 08/14/2008   No results found for: HGBA1C    Assessment & Plan:   Problem List Items Addressed This Visit       Cardiovascular and Mediastinum   HYPERTENSION, BENIGN ESSENTIAL - Primary    BP Readings from Last 3 Encounters:  07/03/20 (!) 146/69  06/03/20 (!) 163/64  05/08/20 129/63  -continue amlodipine -will treat her pain, expect BP to return to < 140/90       Relevant Orders   CBC with  Differential/Platelet   CMP14+EGFR   Lipid Panel With LDL/HDL Ratio     Nervous and Auditory   Carpal tunnel syndrome of right wrist    -refill gabapentin       Relevant Medications   gabapentin (NEURONTIN) 100 MG capsule   tiZANidine (ZANAFLEX) 2 MG tablet     Other   HLD (hyperlipidemia)   Relevant Orders   Lipid Panel With LDL/HDL Ratio   Low back pain    -refilled tizanidine; she states her back felt better while she was taking it       Relevant Medications   tiZANidine (ZANAFLEX) 2 MG tablet   Other Visit Diagnoses     Iron deficiency anemia, unspecified iron deficiency anemia type       Relevant Medications   ferrous sulfate 325 (65 FE) MG tablet   Other Relevant Orders   Iron, TIBC and Ferritin Panel       Meds ordered this encounter  Medications   gabapentin (NEURONTIN) 100 MG capsule    Sig: Take 1 capsule (100 mg total) by mouth daily.    Dispense:  30 capsule    Refill:  2   tiZANidine (ZANAFLEX) 2 MG tablet    Sig: Take 1 tablet (2 mg total) by mouth every 6 (six) hours as needed for muscle spasms.    Dispense:  30  tablet    Refill:  0     Follow-up: Return in about 3 months (around 10/03/2020) for Lab follow-up (HTN, HLD).    Noreene Larsson, NP

## 2020-07-03 NOTE — Patient Instructions (Signed)
Please have fasting labs drawn 2-3 days prior to your appointment so we can discuss the results during your office visit.  

## 2020-07-10 ENCOUNTER — Other Ambulatory Visit: Payer: Self-pay

## 2020-07-10 ENCOUNTER — Telehealth (INDEPENDENT_AMBULATORY_CARE_PROVIDER_SITE_OTHER): Payer: Medicare Other | Admitting: Family Medicine

## 2020-07-10 DIAGNOSIS — R350 Frequency of micturition: Secondary | ICD-10-CM | POA: Diagnosis not present

## 2020-07-10 DIAGNOSIS — I10 Essential (primary) hypertension: Secondary | ICD-10-CM | POA: Diagnosis not present

## 2020-07-10 DIAGNOSIS — R0989 Other specified symptoms and signs involving the circulatory and respiratory systems: Secondary | ICD-10-CM | POA: Diagnosis not present

## 2020-07-10 DIAGNOSIS — R059 Cough, unspecified: Secondary | ICD-10-CM

## 2020-07-10 MED ORDER — BENZONATATE 100 MG PO CAPS: 100.0000 mg | ORAL_CAPSULE | Freq: Two times a day (BID) | ORAL | 0 refills | Status: DC | PRN

## 2020-07-10 MED ORDER — MONTELUKAST SODIUM 10 MG PO TABS: 10.0000 mg | ORAL_TABLET | Freq: Every day | ORAL | 0 refills | Status: DC

## 2020-07-10 NOTE — Patient Instructions (Addendum)
F/U in office in 2 to 3 weeks , call if needed with any additional concerns  Montelukast and tessalon perles are prescribed for nasal drainage and cough  Please submit urine for testing for CCUA and c/s if abnormal since you report feeling weak and have had  a urinary tract infection in the past  Be careful not to fall

## 2020-07-10 NOTE — Progress Notes (Signed)
Virtual Visit via Telephone Note  I connected with Sandra Williams on 07/10/20 at  1:40 PM EDT by telephone and verified that I am speaking with the correct person using two identifiers.  Location: Patient: home Provider: office Sister also involved in visit   I discussed the limitations, risks, security and privacy concerns of performing an evaluation and management service by telephone and the availability of in person appointments. I also discussed with the patient that there may be a patient responsible charge related to this service. The patient expressed understanding and agreed to proceed.   History of Present Illness: Feels weak, aches, joint pain, no fever or chills, coughx 2 days, no nasal drainage or pressure Good appetite, norml BM. No urinary symptoms   Observations/Objective: There were no vitals taken for this visit.  Good communication with no confusion and intact memory. Alert and oriented x 3 No signs of respiratory distress during speech     Assessment and Plan: Cough Symptomatic treatment , tessalon perles and singulair, call if persists or worsens  Runny nose concitent with uncontrolled allergy symptoms, start singulair  Urinary frequency Check CCUA with reflex test if abn, as c/o fatigue  HYPERTENSION, BENIGN ESSENTIAL DASH diet and commitment to daily physical activity for a minimum of 30 minutes discussed and encouraged, as a part of hypertension management. The importance of attaining a healthy weight is also discussed.  BP/Weight 07/03/2020 06/03/2020 05/08/2020 04/14/2020 03/17/2020 5/63/8756 04/06/3293  Systolic BP 188 416 606 301 - 601 093  Diastolic BP 69 64 63 75 - 72 70  Wt. (Lbs) 134 132 - - 136.5 132 135  BMI 23.74 25.78 - - 26.66 25.78 26.37       Follow Up Instructions:    I discussed the assessment and treatment plan with the patient. The patient was provided an opportunity to ask questions and all were answered. The patient agreed  with the plan and demonstrated an understanding of the instructions.   The patient was advised to call back or seek an in-person evaluation if the symptoms worsen or if the condition fails to improve as anticipated. I provided  12  minutes of non-face-to-face time during this encounter.   Tula Nakayama, MD

## 2020-07-13 ENCOUNTER — Telehealth: Payer: Self-pay | Admitting: Family Medicine

## 2020-07-13 ENCOUNTER — Encounter: Payer: Self-pay | Admitting: Family Medicine

## 2020-07-13 NOTE — Assessment & Plan Note (Signed)
concitent with uncontrolled allergy symptoms, start singulair

## 2020-07-13 NOTE — Telephone Encounter (Signed)
At recent visit , on d/I I had requested CCUA and reflex c/s since she c/o fatigue and has ahd uti in the past. I don't see the order so may have been overlooked. Pls call pt if still has atigue as a symptom needs to submit urine, mau just be sent to [facility] ?? Please ask

## 2020-07-13 NOTE — Assessment & Plan Note (Signed)
Check CCUA with reflex test if abn, as c/o fatigue

## 2020-07-13 NOTE — Assessment & Plan Note (Signed)
Symptomatic treatment , tessalon perles and singulair, call if persists or worsens

## 2020-07-13 NOTE — Assessment & Plan Note (Signed)
DASH diet and commitment to daily physical activity for a minimum of 30 minutes discussed and encouraged, as a part of hypertension management. The importance of attaining a healthy weight is also discussed.  BP/Weight 07/03/2020 06/03/2020 05/08/2020 04/14/2020 03/17/2020 5/84/4171 02/11/8716  Systolic BP 367 255 001 642 - 903 795  Diastolic BP 69 64 63 75 - 72 70  Wt. (Lbs) 134 132 - - 136.5 132 135  BMI 23.74 25.78 - - 26.66 25.78 26.37

## 2020-07-14 NOTE — Telephone Encounter (Signed)
Pt never brought a urine to the office, I called and spoke with her and she states as of now she is not having any pain she urinates frequently but that is common for her, wants to wait on bringing specimen at this time.

## 2020-07-15 ENCOUNTER — Other Ambulatory Visit: Payer: Self-pay

## 2020-07-15 DIAGNOSIS — N3000 Acute cystitis without hematuria: Secondary | ICD-10-CM

## 2020-07-15 LAB — POCT URINALYSIS DIP (CLINITEK)
Bilirubin, UA: NEGATIVE
Blood, UA: NEGATIVE
Glucose, UA: NEGATIVE mg/dL
Ketones, POC UA: NEGATIVE mg/dL
Leukocytes, UA: NEGATIVE
Nitrite, UA: NEGATIVE
POC PROTEIN,UA: 100 — AB
Spec Grav, UA: 1.02 (ref 1.010–1.025)
Urobilinogen, UA: 0.2 E.U./dL
pH, UA: 5.5 (ref 5.0–8.0)

## 2020-07-16 NOTE — Progress Notes (Signed)
Left message

## 2020-07-29 ENCOUNTER — Encounter: Payer: Self-pay | Admitting: Nurse Practitioner

## 2020-07-29 ENCOUNTER — Ambulatory Visit (INDEPENDENT_AMBULATORY_CARE_PROVIDER_SITE_OTHER): Payer: Medicare Other | Admitting: Nurse Practitioner

## 2020-07-29 ENCOUNTER — Other Ambulatory Visit: Payer: Self-pay

## 2020-07-29 VITALS — BP 139/74 | HR 90 | Temp 97.5°F | Ht 63.0 in | Wt 133.0 lb

## 2020-07-29 DIAGNOSIS — M545 Low back pain, unspecified: Secondary | ICD-10-CM | POA: Diagnosis not present

## 2020-07-29 DIAGNOSIS — R2241 Localized swelling, mass and lump, right lower limb: Secondary | ICD-10-CM | POA: Diagnosis not present

## 2020-07-29 DIAGNOSIS — I10 Essential (primary) hypertension: Secondary | ICD-10-CM

## 2020-07-29 MED ORDER — CYCLOBENZAPRINE HCL 5 MG PO TABS: 5.0000 mg | ORAL_TABLET | Freq: Three times a day (TID) | ORAL | 1 refills | Status: DC | PRN

## 2020-07-29 MED ORDER — AMLODIPINE BESYLATE 5 MG PO TABS
5.0000 mg | ORAL_TABLET | Freq: Every day | ORAL | 3 refills | Status: DC
Start: 2020-07-29 — End: 2021-05-01

## 2020-07-29 MED ORDER — TRIAMTERENE-HCTZ 37.5-25 MG PO TABS: 1.0000 | ORAL_TABLET | Freq: Every day | ORAL | 3 refills | Status: DC

## 2020-07-29 NOTE — Assessment & Plan Note (Signed)
-  referral to derm -she would like this removed

## 2020-07-29 NOTE — Assessment & Plan Note (Addendum)
BP Readings from Last 3 Encounters:  07/29/20 139/74  07/03/20 (!) 146/69  06/03/20 (!) 163/64   -she has bilateral leg swelling -REDUCE amlodipine to 5 mg daily -Rx. Triamterene-HCTZ -recheck in 1 month; return to clinic sooner if swelling gets worse

## 2020-07-29 NOTE — Progress Notes (Signed)
Established Patient Office Visit  Subjective:  Patient ID: Sandra Williams, female    DOB: [birth year]  Age: 87 y.o. MRN: [id number]  CC:  Chief Complaint  Patient presents with   Hypertension    Follow up   Back Pain    Ongoing for atleast a month+, hurts in the morning and then subsides after she urinates.    Nevus    R foot, ongoing for awhile. Wants to see about getting this taken off.    HPI Sandra Williams presents for HTN follow-up.  She has been having back pain for about a month. She tried tizanidine, but that didn't help much. She has been using tylenol and that has helped some. Pain is mild and in right lower back. Worse with flexion.  She has lesion to right foot she would like removed.  Past Medical History:  Diagnosis Date   Arthritis    ARTHRITIS 10/07/2006   Qualifier: Diagnosis of  By: Jonna Munro MD, Cornelius     Cancer [facility])    cecal carcinoma   Chest pain 10/22/2012   Colon cancer ([place]) 02/01/2012   Stage I (T2, N0, M0) cancer the cecum status post resection by Dr. Arnoldo Morale with 14 lymph nodes found all of which were negative. She had no evidence for metastatic disease on CT scan of the abdomen and pelvis either. Her date of surgery was 10/22/2011.  Not in need of chemotherapy for her stage I cancer.    GERD (gastroesophageal reflux disease)    HTN (hypertension)    Hyperlipidemia    Iron deficiency anemia 02/01/2012   At time of colon cancer presentation   Knee joint pain 2014   right   Lipoma of right forearm s/p excision 07/06/2018    OA (osteoarthritis) of knee 06/27/2012   Osteoporosis    Rotator cuff tear arthropathy 12/11/2013   Small bowel obstruction ([place]) 09/14/2017    Past Surgical History:  Procedure Laterality Date   ABDOMINAL HYSTERECTOMY     partial   APPENDECTOMY     [facility]   CATARACT EXTRACTION Bilateral    CESAREAN SECTION     COLON SURGERY     dental implant     LIPOMA EXCISION Right 07/06/2018   Procedure: EXCISION LIPOMA RIGHT  FOREARM;  Surgeon: Carole Civil, MD;  Location: [facility];  Service: Orthopedics;  Laterality: Right;   PARTIAL COLECTOMY  10/22/2011   Procedure: PARTIAL COLECTOMY;  Surgeon: Jamesetta So, MD;  Location: [facility];  Service: General;  Laterality: N/A;   TOTAL KNEE ARTHROPLASTY Right 01/30/2014   Procedure: TOTAL KNEE ARTHROPLASTY;  Surgeon: Carole Civil, MD;  Location: [facility];  Service: Orthopedics;  Laterality: Right;    Family History  Problem Relation Age of Onset   Hypertension Mother    Cancer Sister    Breast cancer Daughter    Hypertension Daughter    Colon cancer Neg Hx    Liver disease Neg Hx     Social History   Socioeconomic History   Marital status: Single    Spouse name: Not on file   Number of children: 3   Years of education: Not on file   Highest education level: Not on file  Occupational History   Not on file  Tobacco Use   Smoking status: Never   Smokeless tobacco: Never  Vaping Use   Vaping Use: Never used  Substance and Sexual Activity   Alcohol use: No   Drug use: No   Sexual  activity: Never    Birth control/protection: Surgical  Other Topics Concern   Not on file  Social History Narrative   Lives with sister in [place], [place]- son of sister also lives there      Enjoy: sits on the porch- people watching, use to like bingo, walks around house a lot      Diet: eats all food groups   Caffeine: coffee- 3-4 days a week, some tea, drinks coke 3 times a week    Water:  4-5 cups roughly      Wears seat belt   No longer drives   Smoke detectors at home   No weapons she knows of    Social Determinants of Radio broadcast assistant Strain: Low Risk    Difficulty of Paying Living Expenses: Not hard at all  Food Insecurity: No Food Insecurity   Worried About Charity fundraiser in the Last Year: Never true   Arboriculturist in the Last Year: Never true  Transportation Needs: No Transportation Needs   Lack of Transportation  (Medical): No   Lack of Transportation (Non-Medical): No  Physical Activity: Insufficiently Active   Days of Exercise per Week: 5 days   Minutes of Exercise per Session: 10 min  Stress: No Stress Concern Present   Feeling of Stress : Only a little  Social Connections: Moderately Isolated   Frequency of Communication with Friends and Family: More than three times a week   Frequency of Social Gatherings with Friends and Family: More than three times a week   Attends Religious Services: 1 to 4 times per year   Active Member of [employer] or Organizations: No   Attends Archivist Meetings: Never   Marital Status: Never married  Human resources officer Violence: Not At Risk   Fear of Current or Ex-Partner: No   Emotionally Abused: No   Physically Abused: No   Sexually Abused: No    Outpatient Medications Prior to Visit  Medication Sig Dispense Refill   alendronate (FOSAMAX) 70 MG tablet Take 1 tablet (70 mg total) by mouth once a week. Take with a full glass of water on an empty stomach. 30 tablet 0   aspirin 81 MG EC tablet Take 81 mg by mouth daily.      ferrous sulfate 325 (65 FE) MG tablet Take 325 mg by mouth 2 (two) times daily with a meal.     gabapentin (NEURONTIN) 100 MG capsule Take 1 capsule (100 mg total) by mouth daily. 30 capsule 2   montelukast (SINGULAIR) 10 MG tablet Take 1 tablet (10 mg total) by mouth at bedtime. 30 tablet 0   Multiple Vitamin (MULTIVITAMIN WITH MINERALS) TABS tablet Take 1 tablet by mouth daily.     UNABLE TO FIND Med Name: Right hand brace, soft, for carpal tunnel. DX: G56.01 1 Product 0   amLODipine (NORVASC) 10 MG tablet Take 1 tablet (10 mg total) by mouth daily. 30 tablet 3   benzonatate (TESSALON) 100 MG capsule Take 1 capsule (100 mg total) by mouth 2 (two) times daily as needed for cough. (Patient not taking: Reported on 07/29/2020) 20 capsule 0   pravastatin (PRAVACHOL) 80 MG tablet Take 1 tablet (80 mg total) by mouth daily. (Patient not taking:  Reported on 07/29/2020) 90 tablet 3   tiZANidine (ZANAFLEX) 2 MG tablet Take 1 tablet (2 mg total) by mouth every 6 (six) hours as needed for muscle spasms. (Patient not taking:  Reported on 07/29/2020) 30 tablet 0   No facility-administered medications prior to visit.    Allergies  Allergen Reactions   Aspirin     REACTION: Upset stomach if takes regular and uncoated if takes high dose    ROS Review of Systems  Constitutional: Negative.   Respiratory: Negative.    Cardiovascular:  Positive for leg swelling.  Musculoskeletal:  Positive for back pain.  Skin:        Large mole to right foot she would like removed.     Objective:    Physical Exam Constitutional:      Appearance: Normal appearance.  Cardiovascular:     Rate and Rhythm: Normal rate and regular rhythm.     Pulses: Normal pulses.     Heart sounds: Normal heart sounds.     Comments: Bilateral 1+ pitting edema to lower legs Pulmonary:     Effort: Pulmonary effort is normal.     Breath sounds: Normal breath sounds.  Abdominal:     Tenderness: There is no abdominal tenderness. There is no right CVA tenderness or left CVA tenderness.  Musculoskeletal:     Comments: Right lower back pain with flexion during ROM exercises   Neurological:     Mental Status: She is alert.  Psychiatric:        Mood and Affect: Mood normal.        Behavior: Behavior normal.        Thought Content: Thought content normal.        Judgment: Judgment normal.    BP 139/74 (BP Location: Left Arm, Patient Position: Sitting, Cuff Size: Normal)   Pulse 90   Temp (!) 97.5 F (36.4 C) (Temporal)   Ht 5' 3"  (1.6 m)   Wt 133 lb (60.3 kg)   SpO2 95%   BMI 23.56 kg/m  Wt Readings from Last 3 Encounters:  07/29/20 133 lb (60.3 kg)  07/03/20 134 lb (60.8 kg)  06/03/20 132 lb (59.9 kg)     There are no preventive care reminders to display for this patient.  There are no preventive care reminders to display for this patient.  Lab Results   Component Value Date   TSH 1.338 01/12/2012   Lab Results  Component Value Date   WBC 5.0 06/04/2020   HGB 12.0 06/04/2020   HCT 38.1 06/04/2020   MCV 89 06/04/2020   PLT 318 06/04/2020   Lab Results  Component Value Date   NA 148 (H) 06/04/2020   K 4.6 06/04/2020   CO2 21 06/04/2020   GLUCOSE 90 06/04/2020   BUN 14 06/04/2020   CREATININE 0.78 06/04/2020   BILITOT 0.5 06/04/2020   ALKPHOS 60 06/04/2020   AST 21 06/04/2020   ALT 12 06/04/2020   PROT 6.9 06/04/2020   ALBUMIN 4.0 06/04/2020   CALCIUM 9.3 06/04/2020   ANIONGAP 8 07/03/2018   EGFR 71 06/04/2020   Lab Results  Component Value Date   CHOL 261 (H) 06/04/2020   Lab Results  Component Value Date   HDL 88 06/04/2020   Lab Results  Component Value Date   LDLCALC 154 (H) 06/04/2020   Lab Results  Component Value Date   TRIG 109 06/04/2020   Lab Results  Component Value Date   CHOLHDL 3.2 Ratio 08/14/2008   No results found for: HGBA1C    Assessment & Plan:   Problem List Items Addressed This Visit       Cardiovascular and Mediastinum   HYPERTENSION, BENIGN ESSENTIAL -  Primary    BP Readings from Last 3 Encounters:  07/29/20 139/74  07/03/20 (!) 146/69  06/03/20 (!) 163/64  -she has bilateral leg swelling -REDUCE amlodipine to 5 mg daily -Rx. Triamterene-HCTZ -recheck in 1 month; return to clinic sooner if swelling gets worse       Relevant Medications   amLODipine (NORVASC) 5 MG tablet   triamterene-hydrochlorothiazide (MAXZIDE-25) 37.5-25 MG tablet     Other   Low back pain    -likely MSK in origin d/t repeatable with back flexion -STOP tizanidine -Rx. Flexeril -continue OTC tylenol -No U/A today as she has no urinary symptoms and no CVA/abd tenderness       Relevant Medications   cyclobenzaprine (FLEXERIL) 5 MG tablet   Nodule of skin of right foot    -referral to derm -she would like this removed       Relevant Orders   Ambulatory referral to Dermatology    Meds  ordered this encounter  Medications   amLODipine (NORVASC) 5 MG tablet    Sig: Take 1 tablet (5 mg total) by mouth daily.    Dispense:  90 tablet    Refill:  3   triamterene-hydrochlorothiazide (MAXZIDE-25) 37.5-25 MG tablet    Sig: Take 1 tablet by mouth daily.    Dispense:  90 tablet    Refill:  3   cyclobenzaprine (FLEXERIL) 5 MG tablet    Sig: Take 1 tablet (5 mg total) by mouth 3 (three) times daily as needed for muscle spasms.    Dispense:  30 tablet    Refill:  1    Follow-up: Return in about 1 month (around 08/29/2020) for Med check (HTN).    Noreene Larsson, NP

## 2020-07-29 NOTE — Assessment & Plan Note (Signed)
-  likely MSK in origin d/t repeatable with back flexion -STOP tizanidine -Rx. Flexeril -continue OTC tylenol -No U/A today as she has no urinary symptoms and no CVA/abd tenderness

## 2020-07-29 NOTE — Patient Instructions (Signed)
The flexeril that I sent in can make you sleepy or increase risk of falls.

## 2020-08-14 IMAGING — DX DG CHEST 2V
2 series · 2 of 2 positions shown · non-contrast
Comparison: 02/04/2014

CLINICAL DATA: Nausea, vomiting, abdominal pain

EXAM:
CHEST - 2 VIEW

[chest pa]
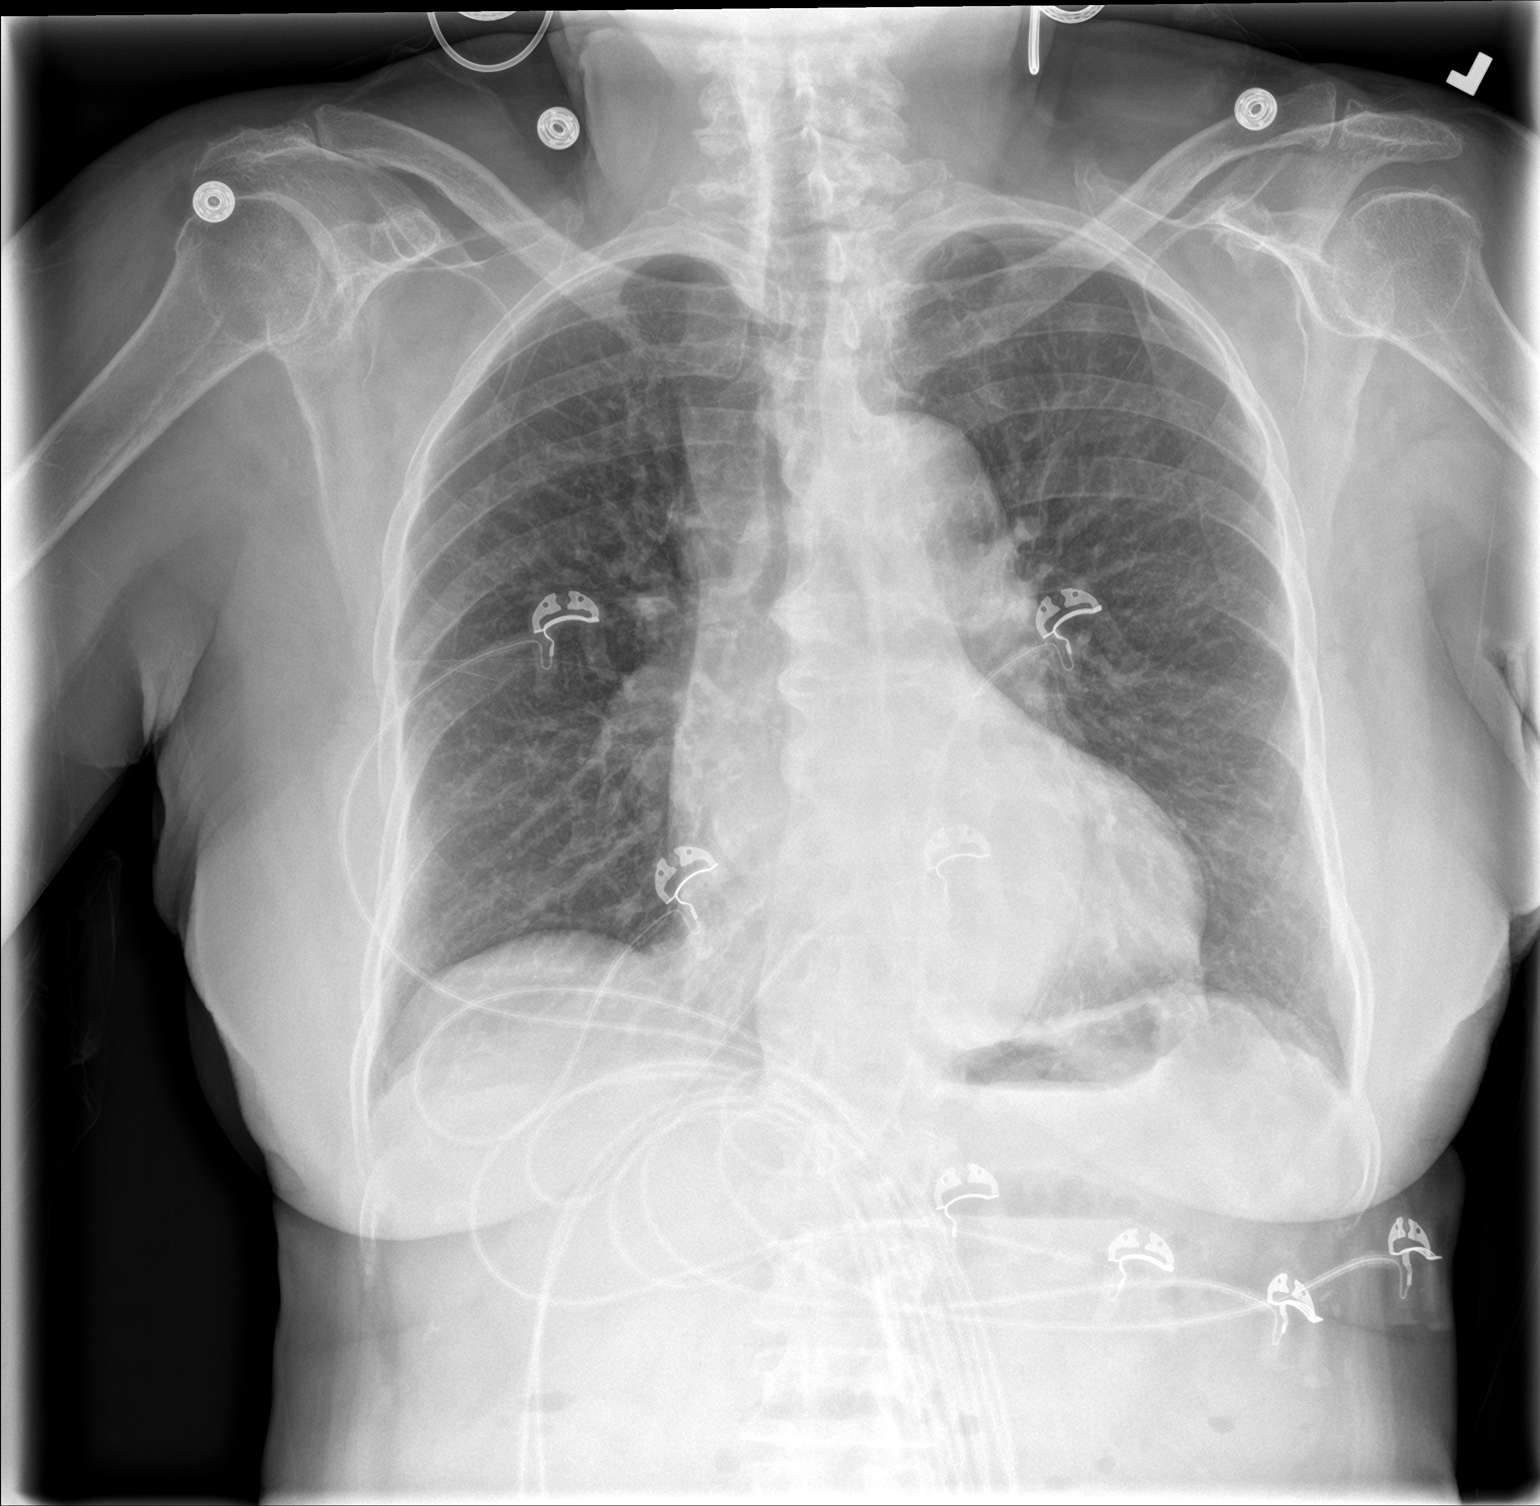

[chest lat]
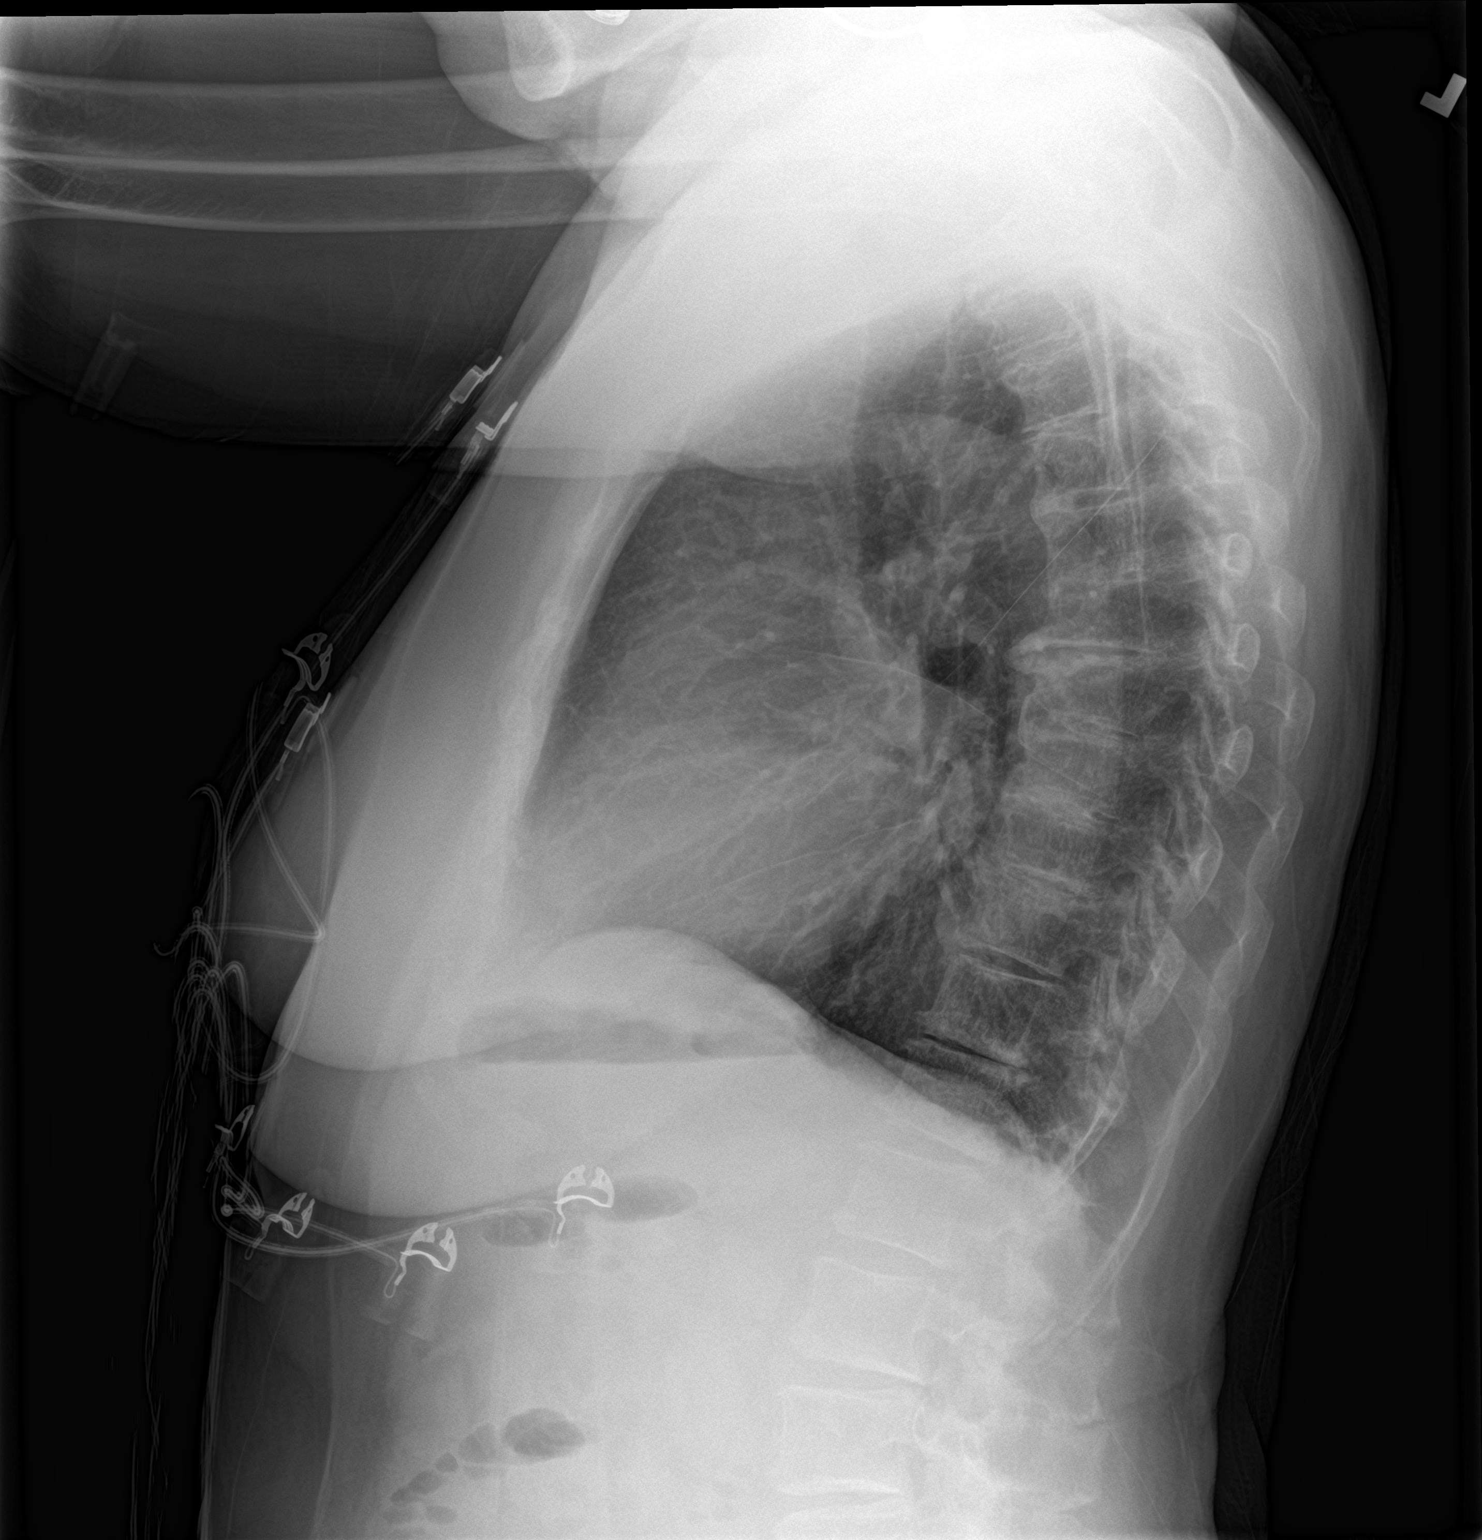

[2 of 2 positions shown; findings below may reference images not displayed]

FINDINGS: Heart is borderline in size. Tortuosity of the thoracic aorta. No
confluent airspace opacities or effusions. No acute bony
abnormality.
IMPRESSION: No active cardiopulmonary disease.

## 2020-09-04 ENCOUNTER — Ambulatory Visit: Payer: Medicare Other | Admitting: Nurse Practitioner

## 2020-09-27 ENCOUNTER — Ambulatory Visit (INDEPENDENT_AMBULATORY_CARE_PROVIDER_SITE_OTHER): Payer: Medicare Other | Admitting: *Deleted

## 2020-09-27 ENCOUNTER — Other Ambulatory Visit: Payer: Self-pay

## 2020-09-27 DIAGNOSIS — Z0000 Encounter for general adult medical examination without abnormal findings: Secondary | ICD-10-CM | POA: Diagnosis not present

## 2020-09-27 NOTE — Progress Notes (Signed)
Subjective:   Sandra Williams is a 87 y.o. female who presents for Medicare Annual (Subsequent) preventive examination.  I connected with  Sandra Williams on 09/27/20 by an audio enabled telemedicine application and verified that I am speaking with the correct person using two identifiers.   I discussed the limitations, risks, security and privacy concerns of performing an evaluation and management service by telephone and the availability of in person appointments. I also discussed with the patient that there may be a patient responsible charge related to this service. The patient expressed understanding and verbally consented to this telephonic visit.   Review of Systems           Objective:    There were no vitals filed for this visit. There is no height or weight on file to calculate BMI.  Advanced Directives 07/06/2018 07/03/2018 09/14/2017 09/14/2017 07/21/2015 10/06/2014 07/17/2014  Does Patient Have a Medical Advance Directive? Yes Yes No No Yes No No;Yes  Type of Paramedic of [place];Living will - - [place];Living will - Healthcare Power of Attorney  Does patient want to make changes to medical advance directive? - No - Patient declined - - No - Patient declined - -  Copy of Portal in Chart? - No - copy requested - - No - copy requested - No - copy requested  Would patient like information on creating a medical advance directive? - - No - Patient declined No - Patient declined - No - patient declined information -  Pre-existing out of facility DNR order (yellow form or pink MOST form) - - - - - - -    Current Medications (verified) Outpatient Encounter Medications as of 09/27/2020  Medication Sig   alendronate (FOSAMAX) 70 MG tablet Take 1 tablet (70 mg total) by mouth once a week. Take with a full glass of water on an empty stomach.   amLODipine (NORVASC) 5 MG tablet Take 1  tablet (5 mg total) by mouth daily.   aspirin 81 MG EC tablet Take 81 mg by mouth daily.    cyclobenzaprine (FLEXERIL) 5 MG tablet Take 1 tablet (5 mg total) by mouth 3 (three) times daily as needed for muscle spasms.   ferrous sulfate 325 (65 FE) MG tablet Take 325 mg by mouth 2 (two) times daily with a meal.   gabapentin (NEURONTIN) 100 MG capsule Take 1 capsule (100 mg total) by mouth daily.   montelukast (SINGULAIR) 10 MG tablet Take 1 tablet (10 mg total) by mouth at bedtime.   Multiple Vitamin (MULTIVITAMIN WITH MINERALS) TABS tablet Take 1 tablet by mouth daily.   triamterene-hydrochlorothiazide (MAXZIDE-25) 37.5-25 MG tablet Take 1 tablet by mouth daily.   UNABLE TO FIND Med Name: Right hand brace, soft, for carpal tunnel. DX: G56.01   No facility-administered encounter medications on file as of 09/27/2020.    Allergies (verified) Aspirin   History: Past Medical History:  Diagnosis Date   Arthritis    ARTHRITIS 10/07/2006   Qualifier: Diagnosis of  By: Jonna Munro MD, Cornelius     Cancer [facility])    cecal carcinoma   Chest pain 10/22/2012   Colon cancer ([place]) 02/01/2012   Stage I (T2, N0, M0) cancer the cecum status post resection by Dr. Arnoldo Morale with 14 lymph nodes found all of which were negative. She had no evidence for metastatic disease on CT scan of the abdomen and pelvis either. Her date of surgery was 10/22/2011.  Not in need of chemotherapy for her stage I cancer.    GERD (gastroesophageal reflux disease)    HTN (hypertension)    Hyperlipidemia    Iron deficiency anemia 02/01/2012   At time of colon cancer presentation   Knee joint pain 2014   right   Lipoma of right forearm s/p excision 07/06/2018    OA (osteoarthritis) of knee 06/27/2012   Osteoporosis    Rotator cuff tear arthropathy 12/11/2013   Small bowel obstruction ([place]) 09/14/2017   Past Surgical History:  Procedure Laterality Date   ABDOMINAL HYSTERECTOMY     partial   APPENDECTOMY     [facility]   CATARACT  EXTRACTION Bilateral    CESAREAN SECTION     COLON SURGERY     dental implant     LIPOMA EXCISION Right 07/06/2018   Procedure: EXCISION LIPOMA RIGHT FOREARM;  Surgeon: Carole Civil, MD;  Location: [facility];  Service: Orthopedics;  Laterality: Right;   PARTIAL COLECTOMY  10/22/2011   Procedure: PARTIAL COLECTOMY;  Surgeon: Jamesetta So, MD;  Location: [facility];  Service: General;  Laterality: N/A;   TOTAL KNEE ARTHROPLASTY Right 01/30/2014   Procedure: TOTAL KNEE ARTHROPLASTY;  Surgeon: Carole Civil, MD;  Location: [facility];  Service: Orthopedics;  Laterality: Right;   Family History  Problem Relation Age of Onset   Hypertension Mother    Cancer Sister    Breast cancer Daughter    Hypertension Daughter    Colon cancer Neg Hx    Liver disease Neg Hx    Social History   Socioeconomic History   Marital status: Single    Spouse name: Not on file   Number of children: 3   Years of education: Not on file   Highest education level: Not on file  Occupational History   Not on file  Tobacco Use   Smoking status: Never   Smokeless tobacco: Never  Vaping Use   Vaping Use: Never used  Substance and Sexual Activity   Alcohol use: No   Drug use: No   Sexual activity: Never    Birth control/protection: Surgical  Other Topics Concern   Not on file  Social History Narrative   Lives with sister in [place], [place]- son of sister also lives there      Enjoy: sits on the porch- people watching, use to like bingo, walks around house a lot      Diet: eats all food groups   Caffeine: coffee- 3-4 days a week, some tea, drinks coke 3 times a week    Water:  4-5 cups roughly      Wears seat belt   No longer drives   Smoke detectors at home   No weapons she knows of    Social Determinants of Radio broadcast assistant Strain: Low Risk    Difficulty of Paying Living Expenses: Not hard at all  Food Insecurity: No Food Insecurity   Worried About Charity fundraiser in  the Last Year: Never true   Arboriculturist in the Last Year: Never true  Transportation Needs: No Transportation Needs   Lack of Transportation (Medical): No   Lack of Transportation (Non-Medical): No  Physical Activity: Insufficiently Active   Days of Exercise per Week: 5 days   Minutes of Exercise per Session: 10 min  Stress: No Stress Concern Present   Feeling of Stress : Only a little  Social Connections: Moderately Isolated  Frequency of Communication with Friends and Family: More than three times a week   Frequency of Social Gatherings with Friends and Family: More than three times a week   Attends Religious Services: 1 to 4 times per year   Active Member of [employer] or Organizations: No   Attends Archivist Meetings: Never   Marital Status: Never married    Tobacco Counseling Counseling given: Not Answered   Clinical Intake:                 Diabetic?no        Activities of Daily Living No flowsheet data found.  Patient Care Team: Noreene Larsson, NP as PCP - General (Nurse Practitioner)  Indicate any recent Medical Services you may have received from other than [facility] providers in the past year (date may be approximate).     Assessment:   This is a routine wellness examination for [place].  Hearing/Vision screen No results found.  Dietary issues and exercise activities discussed:     Goals Addressed   None   Depression Screen PHQ 2/9 Scores 07/29/2020 07/10/2020 07/03/2020 06/03/2020 02/20/2020 02/06/2020 11/06/2019  PHQ - 2 Score 0 4 1 0 0 0 2  PHQ- 9 Score 5 7 - - - - 3    Fall Risk Fall Risk  07/29/2020 07/10/2020 07/03/2020 06/03/2020 02/20/2020  Falls in the past year? 0 0 0 0 0  Comment - - - - -  Number falls in past yr: 0 0 0 0 0  Injury with Fall? 0 0 0 0 0  Risk for fall due to : No Fall Risks No Fall Risks No Fall Risks No Fall Risks No Fall Risks  Follow up Falls evaluation completed Falls evaluation completed Falls evaluation  completed Falls evaluation completed Falls evaluation completed    FALL RISK PREVENTION PERTAINING TO THE HOME:  Any stairs in or around the home? Yes  If so, are there any without handrails? Yes  Home free of loose throw rugs in walkways, pet beds, electrical cords, etc? Yes  Adequate lighting in your home to reduce risk of falls? Yes   ASSISTIVE DEVICES UTILIZED TO PREVENT FALLS:  Life alert? No  Use of a cane, walker or w/c? Yes  Grab bars in the bathroom? No  Shower chair or bench in shower? Yes  Elevated toilet seat or a handicapped toilet? No   TIMED UP AND GO:  Was the test performed? No .  Length of time to ambulate 10 feet: NA sec.     Cognitive Function:        Immunizations Immunization History  Administered Date(s) Administered   Fluad Quad(high Dose 65+) 11/06/2019   Influenza Whole 10/07/2006   Moderna Sars-Covid-2 Vaccination 03/03/2019, 03/31/2019   Pneumococcal Polysaccharide-23 10/07/2006    TDAP status: Due, Education has been provided regarding the importance of this vaccine. Advised may receive this vaccine at local pharmacy or Health Dept. Aware to provide a copy of the vaccination record if obtained from local pharmacy or Health Dept. Verbalized acceptance and understanding.  Flu Vaccine status: Due, Education has been provided regarding the importance of this vaccine. Advised may receive this vaccine at local pharmacy or Health Dept. Aware to provide a copy of the vaccination record if obtained from local pharmacy or Health Dept. Verbalized acceptance and understanding.  Pneumococcal vaccine status: Up to date  Covid-19 vaccine status: Completed vaccines  Qualifies for Shingles Vaccine? Yes   Zostavax completed No   Shingrix  Completed?: No.    Education has been provided regarding the importance of this vaccine. Patient has been advised to call insurance company to determine out of pocket expense if they have not yet received this vaccine.  Advised may also receive vaccine at local pharmacy or Health Dept. Verbalized acceptance and understanding.  Screening Tests Health Maintenance  Topic Date Due   TETANUS/TDAP  Never done   COVID-19 Vaccine (3 - Booster for Moderna series) 08/31/2019   INFLUENZA VACCINE  08/04/2020   Zoster Vaccines- Shingrix (1 of 2) 10/03/2020 (Originally 01/20/1975)   DEXA SCAN  Completed   HPV VACCINES  Aged Out    Health Maintenance  Health Maintenance Due  Topic Date Due   TETANUS/TDAP  Never done   COVID-19 Vaccine (3 - Booster for Moderna series) 08/31/2019   INFLUENZA VACCINE  08/04/2020    Colorectal cancer screening: No longer required.   Mammogram status: No longer required due to age.    Lung Cancer Screening: (Low Dose CT Chest recommended if Age 57-80 years, 30 pack-year currently smoking OR have quit w/in 15years.) does not qualify.   Lung Cancer Screening Referral: NA  Additional Screening:  Hepatitis C Screening: Does not qualify; Completed   Vision Screening: Recommended annual ophthalmology exams for early detection of glaucoma and other disorders of the eye. Is the patient up to date with their annual eye exam?  No  Who is the provider or what is the name of the office in which the patient attends annual eye exams?   If pt is not established with a provider, would they like to be referred to a provider to establish care? No .   Dental Screening: Recommended annual dental exams for proper oral hygiene  Community Resource Referral / Chronic Care Management: CRR required this visit?  No   CCM required this visit?  No      Plan:     I have personally reviewed and noted the following in the patient's chart:   Medical and social history Use of alcohol, tobacco or illicit drugs  Current medications and supplements including opioid prescriptions.  Functional ability and status Nutritional status Physical activity Advanced directives List of other  physicians Hospitalizations, surgeries, and ER visits in previous 12 months Vitals Screenings to include cognitive, depression, and falls Referrals and appointments  In addition, I have reviewed and discussed with patient certain preventive protocols, quality metrics, and best practice recommendations. A written personalized care plan for preventive services as well as general preventive health recommendations were provided to patient.     Shelda Altes, CMA   09/27/2020   Nurse Notes: This was a telehealth visit. The patient was at home. The provider was at home and was Demetrius Revel NP.

## 2020-09-27 NOTE — Patient Instructions (Signed)
Ms. Sandra Williams , Thank you for taking time to come for your Medicare Wellness Visit. I appreciate your ongoing commitment to your health goals. Please review the following plan we discussed and let me know if I can assist you in the future.   Screening recommendations/referrals: Recommended yearly ophthalmology/optometry visit for glaucoma screening and checkup Recommended yearly dental visit for hygiene and checkup  Vaccinations: Influenza vaccine: Due now Pneumococcal vaccine: Completed Tdap vaccine: Due now  Shingles vaccine: Due now     Advanced directives: information provided  Conditions/risks identified: Hypertension   Next appointment: 1 year   Preventive Care 64 Years and Older, Female Preventive care refers to lifestyle choices and visits with your health care provider that can promote health and wellness. What does preventive care include? A yearly physical exam. This is also called an annual well check. Dental exams once or twice a year. Routine eye exams. Ask your health care provider how often you should have your eyes checked. Personal lifestyle choices, including: Daily care of your teeth and gums. Regular physical activity. Eating a healthy diet. Avoiding tobacco and drug use. Limiting alcohol use. Practicing safe sex. Taking low-dose aspirin every day. Taking vitamin and mineral supplements as recommended by your health care provider. What happens during an annual well check? The services and screenings done by your health care provider during your annual well check will depend on your age, overall health, lifestyle risk factors, and family history of disease. Counseling  Your health care provider may ask you questions about your: Alcohol use. Tobacco use. Drug use. Emotional well-being. Home and relationship well-being. Sexual activity. Eating habits. History of falls. Memory and ability to understand (cognition). Work and work  Statistician. Reproductive health. Screening  You may have the following tests or measurements: Height, weight, and BMI. Blood pressure. Lipid and cholesterol levels. These may be checked every 5 years, or more frequently if you are over 67 years old. Skin check. Lung cancer screening. You may have this screening every year starting at age 80 if you have a 30-pack-year history of smoking and currently smoke or have quit within the past 15 years. Fecal occult blood test (FOBT) of the stool. You may have this test every year starting at age 18. Flexible sigmoidoscopy or colonoscopy. You may have a sigmoidoscopy every 5 years or a colonoscopy every 10 years starting at age 18. Hepatitis C blood test. Hepatitis B blood test. Sexually transmitted disease (STD) testing. Diabetes screening. This is done by checking your blood sugar (glucose) after you have not eaten for a while (fasting). You may have this done every 1-3 years. Bone density scan. This is done to screen for osteoporosis. You may have this done starting at age 82. Mammogram. This may be done every 1-2 years. Talk to your health care provider about how often you should have regular mammograms. Talk with your health care provider about your test results, treatment options, and if necessary, the need for more tests. Vaccines  Your health care provider may recommend certain vaccines, such as: Influenza vaccine. This is recommended every year. Tetanus, diphtheria, and acellular pertussis (Tdap, Td) vaccine. You may need a Td booster every 10 years. Zoster vaccine. You may need this after age 68. Pneumococcal 13-valent conjugate (PCV13) vaccine. One dose is recommended after age 58. Pneumococcal polysaccharide (PPSV23) vaccine. One dose is recommended after age 68. Talk to your health care provider about which screenings and vaccines you need and how often you need them. This information is  not intended to replace advice given to you by  your health care provider. Make sure you discuss any questions you have with your health care provider. Document Released: 01/17/2015 Document Revised: 09/10/2015 Document Reviewed: 10/22/2014 Elsevier Interactive Patient Education  2017 [place] Prevention in the Home Falls can cause injuries. They can happen to people of all ages. There are many things you can do to make your home safe and to help prevent falls. What can I do on the outside of my home? Regularly fix the edges of walkways and driveways and fix any cracks. Remove anything that might make you trip as you walk through a door, such as a raised step or threshold. Trim any bushes or trees on the path to your home. Use bright outdoor lighting. Clear any walking paths of anything that might make someone trip, such as rocks or tools. Regularly check to see if handrails are loose or broken. Make sure that both sides of any steps have handrails. Any raised decks and porches should have guardrails on the edges. Have any leaves, snow, or ice cleared regularly. Use sand or salt on walking paths during winter. Clean up any spills in your garage right away. This includes oil or grease spills. What can I do in the bathroom? Use night lights. Install grab bars by the toilet and in the tub and shower. Do not use towel bars as grab bars. Use non-skid mats or decals in the tub or shower. If you need to sit down in the shower, use a plastic, non-slip stool. Keep the floor dry. Clean up any water that spills on the floor as soon as it happens. Remove soap buildup in the tub or shower regularly. Attach bath mats securely with double-sided non-slip rug tape. Do not have throw rugs and other things on the floor that can make you trip. What can I do in the bedroom? Use night lights. Make sure that you have a light by your bed that is easy to reach. Do not use any sheets or blankets that are too big for your bed. They should not hang  down onto the floor. Have a firm chair that has side arms. You can use this for support while you get dressed. Do not have throw rugs and other things on the floor that can make you trip. What can I do in the kitchen? Clean up any spills right away. Avoid walking on wet floors. Keep items that you use a lot in easy-to-reach places. If you need to reach something above you, use a strong step stool that has a grab bar. Keep electrical cords out of the way. Do not use floor polish or wax that makes floors slippery. If you must use wax, use non-skid floor wax. Do not have throw rugs and other things on the floor that can make you trip. What can I do with my stairs? Do not leave any items on the stairs. Make sure that there are handrails on both sides of the stairs and use them. Fix handrails that are broken or loose. Make sure that handrails are as long as the stairways. Check any carpeting to make sure that it is firmly attached to the stairs. Fix any carpet that is loose or worn. Avoid having throw rugs at the top or bottom of the stairs. If you do have throw rugs, attach them to the floor with carpet tape. Make sure that you have a light switch at the top of the  stairs and the bottom of the stairs. If you do not have them, ask someone to add them for you. What else can I do to help prevent falls? Wear shoes that: Do not have high heels. Have rubber bottoms. Are comfortable and fit you well. Are closed at the toe. Do not wear sandals. If you use a stepladder: Make sure that it is fully opened. Do not climb a closed stepladder. Make sure that both sides of the stepladder are locked into place. Ask someone to hold it for you, if possible. Clearly mark and make sure that you can see: Any grab bars or handrails. First and last steps. Where the edge of each step is. Use tools that help you move around (mobility aids) if they are needed. These  include: Canes. Walkers. Scooters. Crutches. Turn on the lights when you go into a dark area. Replace any light bulbs as soon as they burn out. Set up your furniture so you have a clear path. Avoid moving your furniture around. If any of your floors are uneven, fix them. If there are any pets around you, be aware of where they are. Review your medicines with your doctor. Some medicines can make you feel dizzy. This can increase your chance of falling. Ask your doctor what other things that you can do to help prevent falls. This information is not intended to replace advice given to you by your health care provider. Make sure you discuss any questions you have with your health care provider. Document Released: 10/17/2008 Document Revised: 05/29/2015 Document Reviewed: 01/25/2014 Elsevier Interactive Patient Education  2017 [employer].

## 2020-10-02 ENCOUNTER — Ambulatory Visit: Payer: Medicare Other | Admitting: Nurse Practitioner

## 2020-11-12 ENCOUNTER — Telehealth: Payer: Self-pay | Admitting: Nurse Practitioner

## 2020-11-12 NOTE — Telephone Encounter (Signed)
FMLA   Noted Copied  Sleeved  

## 2020-12-10 ENCOUNTER — Encounter: Payer: Self-pay | Admitting: Nurse Practitioner

## 2020-12-10 ENCOUNTER — Ambulatory Visit (INDEPENDENT_AMBULATORY_CARE_PROVIDER_SITE_OTHER): Payer: Medicare Other | Admitting: Nurse Practitioner

## 2020-12-10 ENCOUNTER — Other Ambulatory Visit: Payer: Self-pay

## 2020-12-10 DIAGNOSIS — M1990 Unspecified osteoarthritis, unspecified site: Secondary | ICD-10-CM | POA: Diagnosis not present

## 2020-12-10 DIAGNOSIS — I10 Essential (primary) hypertension: Secondary | ICD-10-CM | POA: Diagnosis not present

## 2020-12-10 NOTE — Progress Notes (Signed)
Acute Office Visit  Subjective:    Patient ID: Sandra Williams, female    DOB: May 05, 1925, 87 y.o.   MRN: [id number]  Chief Complaint  Patient presents with   Arm Pain    FMLA paperwork for her son.    Arm Pain   Patient is in today for FMLA request for her son.  She states her son lives in [place] and she would like FMLA paperwork completed so he can come down and "check on her" from time to time.  He had filled out some of the forms and he was requesting leave to be able to help her get to medical appointments when she needs help, but he lives hundreds of miles away. She states she has a daughter that helps her with transportation to appointments currently.  She hasn't seen Dr. Aline Brochure since May, so she isn't closely followed by any other specialists at this time to my knowledge per chart review of the last year.  Past Medical History:  Diagnosis Date   Arthritis    ARTHRITIS 10/07/2006   Qualifier: Diagnosis of  By: Jonna Munro MD, Cornelius     Cancer [facility])    cecal carcinoma   Chest pain 10/22/2012   Colon cancer ([place]) 02/01/2012   Stage I (T2, N0, M0) cancer the cecum status post resection by Dr. Arnoldo Morale with 14 lymph nodes found all of which were negative. She had no evidence for metastatic disease on CT scan of the abdomen and pelvis either. Her date of surgery was 10/22/2011.  Not in need of chemotherapy for her stage I cancer.    GERD (gastroesophageal reflux disease)    HTN (hypertension)    Hyperlipidemia    Iron deficiency anemia 02/01/2012   At time of colon cancer presentation   Knee joint pain 2014   right   Lipoma of right forearm s/p excision 07/06/2018    OA (osteoarthritis) of knee 06/27/2012   Osteoporosis    Rotator cuff tear arthropathy 12/11/2013   Small bowel obstruction ([place]) 09/14/2017    Past Surgical History:  Procedure Laterality Date   ABDOMINAL HYSTERECTOMY     partial   APPENDECTOMY     [facility]   CATARACT EXTRACTION Bilateral    CESAREAN SECTION      COLON SURGERY     dental implant     LIPOMA EXCISION Right 07/06/2018   Procedure: EXCISION LIPOMA RIGHT FOREARM;  Surgeon: Carole Civil, MD;  Location: [facility];  Service: Orthopedics;  Laterality: Right;   PARTIAL COLECTOMY  10/22/2011   Procedure: PARTIAL COLECTOMY;  Surgeon: Jamesetta So, MD;  Location: [facility];  Service: General;  Laterality: N/A;   TOTAL KNEE ARTHROPLASTY Right 01/30/2014   Procedure: TOTAL KNEE ARTHROPLASTY;  Surgeon: Carole Civil, MD;  Location: [facility];  Service: Orthopedics;  Laterality: Right;    Family History  Problem Relation Age of Onset   Hypertension Mother    Cancer Sister    Breast cancer Daughter    Hypertension Daughter    Colon cancer Neg Hx    Liver disease Neg Hx     Social History   Socioeconomic History   Marital status: Single    Spouse name: Not on file   Number of children: 3   Years of education: Not on file   Highest education level: Not on file  Occupational History   Not on file  Tobacco Use   Smoking status: Never   Smokeless tobacco: Never  Vaping Use  Vaping Use: Never used  Substance and Sexual Activity   Alcohol use: No   Drug use: No   Sexual activity: Never    Birth control/protection: Surgical  Other Topics Concern   Not on file  Social History Narrative   Lives with daughter in [place], [place]: sits on the porch- people watching, use to like bingo, walks around house a lot      Diet: eats all food groups   Caffeine: coffee- 3-4 days a week, some tea, drinks coke 3 times a week    Water:  4-5 cups roughly      Wears seat belt   No longer drives   Smoke detectors at home   No weapons she knows of    Social Determinants of Radio broadcast assistant Strain: Low Risk    Difficulty of Paying Living Expenses: Not hard at all  Food Insecurity: No Food Insecurity   Worried About Charity fundraiser in the Last Year: Never true   Arboriculturist in the Last Year: Never true   Transportation Needs: No Transportation Needs   Lack of Transportation (Medical): No   Lack of Transportation (Non-Medical): No  Physical Activity: Sufficiently Active   Days of Exercise per Week: 4 days   Minutes of Exercise per Session: 60 min  Stress: No Stress Concern Present   Feeling of Stress : Not at all  Social Connections: Moderately Isolated   Frequency of Communication with Friends and Family: More than three times a week   Frequency of Social Gatherings with Friends and Family: More than three times a week   Attends Religious Services: More than 4 times per year   Active Member of [employer] or Organizations: No   Attends Archivist Meetings: Never   Marital Status: Never married  Human resources officer Violence: Not At Risk   Fear of Current or Ex-Partner: No   Emotionally Abused: No   Physically Abused: No   Sexually Abused: No    Outpatient Medications Prior to Visit  Medication Sig Dispense Refill   acetaminophen (TYLENOL) 500 MG tablet Take 500 mg by mouth every 6 (six) hours as needed.     amLODipine (NORVASC) 5 MG tablet Take 1 tablet (5 mg total) by mouth daily. 90 tablet 3   aspirin 81 MG EC tablet Take 81 mg by mouth daily.      Multiple Vitamin (MULTIVITAMIN WITH MINERALS) TABS tablet Take 1 tablet by mouth daily.     triamterene-hydrochlorothiazide (MAXZIDE-25) 37.5-25 MG tablet Take 1 tablet by mouth daily. 90 tablet 3   UNABLE TO FIND Med Name: Right hand brace, soft, for carpal tunnel. DX: G56.01 1 Product 0   alendronate (FOSAMAX) 70 MG tablet Take 1 tablet (70 mg total) by mouth once a week. Take with a full glass of water on an empty stomach. (Patient not taking: Reported on 12/10/2020) 30 tablet 0   cyclobenzaprine (FLEXERIL) 5 MG tablet Take 1 tablet (5 mg total) by mouth 3 (three) times daily as needed for muscle spasms. (Patient not taking: Reported on 12/10/2020) 30 tablet 1   ferrous sulfate 325 (65 FE) MG tablet Take 325 mg by mouth 2 (two) times  daily with a meal.     gabapentin (NEURONTIN) 100 MG capsule Take 1 capsule (100 mg total) by mouth daily. (Patient not taking: Reported on 12/10/2020) 30 capsule 2   montelukast (SINGULAIR) 10 MG tablet Take 1 tablet (  10 mg total) by mouth at bedtime. (Patient not taking: Reported on 12/10/2020) 30 tablet 0   No facility-administered medications prior to visit.    Allergies  Allergen Reactions   Aspirin     REACTION: Upset stomach if takes regular and uncoated if takes high dose    Review of Systems  Constitutional: Negative.   Respiratory: Negative.    Cardiovascular: Negative.   Psychiatric/Behavioral: Negative.        Objective:    Physical Exam Constitutional:      Appearance: Normal appearance.  Cardiovascular:     Rate and Rhythm: Normal rate and regular rhythm.     Pulses: Normal pulses.     Heart sounds: Normal heart sounds.  Pulmonary:     Effort: Pulmonary effort is normal.     Breath sounds: Normal breath sounds.  Neurological:     Mental Status: She is alert.  Psychiatric:        Mood and Affect: Mood normal.        Behavior: Behavior normal.        Thought Content: Thought content normal.        Judgment: Judgment normal.    BP (!) 148/70   Pulse 91   Resp 16   Ht _0  (1.6 m)   Wt 129 lb 6.4 oz (58.7 kg)   SpO2 95%   BMI 22.92 kg/m  Wt Readings from Last 3 Encounters:  12/10/20 129 lb 6.4 oz (58.7 kg)  07/29/20 133 lb (60.3 kg)  07/03/20 134 lb (60.8 kg)    Health Maintenance Due  Topic Date Due   TETANUS/TDAP  Never done   Zoster Vaccines- Shingrix (1 of 2) Never done   Pneumonia Vaccine 47+ Years old (2 - PCV) 10/07/2007   COVID-19 Vaccine (3 - Booster for Moderna series) 05/26/2019   INFLUENZA VACCINE  08/04/2020    There are no preventive care reminders to display for this patient.   Lab Results  Component Value Date   TSH 1.338 01/12/2012   Lab Results  Component Value Date   WBC 5.0 06/04/2020   HGB 12.0 06/04/2020   HCT  38.1 06/04/2020   MCV 89 06/04/2020   PLT 318 06/04/2020   Lab Results  Component Value Date   NA 148 (H) 06/04/2020   K 4.6 06/04/2020   CO2 21 06/04/2020   GLUCOSE 90 06/04/2020   BUN 14 06/04/2020   CREATININE 0.78 06/04/2020   BILITOT 0.5 06/04/2020   ALKPHOS 60 06/04/2020   AST 21 06/04/2020   ALT 12 06/04/2020   PROT 6.9 06/04/2020   ALBUMIN 4.0 06/04/2020   CALCIUM 9.3 06/04/2020   ANIONGAP 8 07/03/2018   EGFR 71 06/04/2020   Lab Results  Component Value Date   CHOL 261 (H) 06/04/2020   Lab Results  Component Value Date   HDL 88 06/04/2020   Lab Results  Component Value Date   LDLCALC 154 (H) 06/04/2020   Lab Results  Component Value Date   TRIG 109 06/04/2020   Lab Results  Component Value Date   CHOLHDL 3.2 Ratio 08/14/2008   No results found for: HGBA1C     Assessment & Plan:   Problem List Items Addressed This Visit       Cardiovascular and Mediastinum   HYPERTENSION, BENIGN ESSENTIAL    -she is requesting FMLA for her out-of-state son (who lives in [place]) for her to get back and forth to her medical appointments -Denying that request at  present as she has reliable transportation from another family member, and it would be extremely inconvenient for him to provide regular transportation for her -will revisit pending a call from her son in the future        No orders of the defined types were placed in this encounter.    Noreene Larsson, NP

## 2020-12-10 NOTE — Assessment & Plan Note (Signed)
-  she is requesting FMLA for her out-of-state son (who lives in [place]) for her to get back and forth to her medical appointments -Denying that request at present as she has reliable transportation from another family member, and it would be extremely inconvenient for him to provide regular transportation for her -will revisit pending a call from her son in the future

## 2020-12-10 NOTE — Assessment & Plan Note (Signed)
-  her son called and he helps her with ADLs and transportation when other family members that usually help the pt are unavailable -FMLA request granted

## 2020-12-10 NOTE — Telephone Encounter (Signed)
Patient took forms with her from her appointment on 12.07.2022

## 2021-01-02 ENCOUNTER — Other Ambulatory Visit: Payer: Self-pay

## 2021-01-02 ENCOUNTER — Encounter: Payer: Self-pay | Admitting: Emergency Medicine

## 2021-01-02 ENCOUNTER — Ambulatory Visit (INDEPENDENT_AMBULATORY_CARE_PROVIDER_SITE_OTHER): Payer: Medicare Other

## 2021-01-02 ENCOUNTER — Ambulatory Visit
Admission: EM | Admit: 2021-01-02 | Discharge: 2021-01-02 | Disposition: A | Discharge status: Home / Self Care | Payer: Medicare Other | Attending: Family Medicine | Admitting: Family Medicine

## 2021-01-02 DIAGNOSIS — M19011 Primary osteoarthritis, right shoulder: Secondary | ICD-10-CM

## 2021-01-02 DIAGNOSIS — M25511 Pain in right shoulder: Secondary | ICD-10-CM

## 2021-01-02 MED ORDER — DEXAMETHASONE SODIUM PHOSPHATE 10 MG/ML IJ SOLN
10.0000 mg | Freq: Once | INTRAMUSCULAR | Status: AC
  Administered 2021-01-02: 16:00:00 10 mg via INTRAMUSCULAR

## 2021-01-02 NOTE — ED Provider Notes (Signed)
[facility]    CSN: [id number] Arrival date & time: 01/02/21  1233      History   Chief Complaint No chief complaint on file.   HPI Sandra Williams is a 87 y.o. female.   Patient presented today with acute on chronic right shoulder pain, stiffness now with pain running down the arm and worsening numbness and weakness in the hand.  She denies any injury to exacerbate symptoms.  Denies swelling, discoloration, loss of range of motion, neck pain, chest pain, shortness of breath.  Tylenol relieves the pain fairly well for a short period of time.  States this has been an ongoing issue for years but has never had an x-ray to her knowledge of the area.  Has arthritis elsewhere but unsure if in this area.   Past Medical History:  Diagnosis Date   Arthritis    ARTHRITIS 10/07/2006   Qualifier: Diagnosis of  By: Jonna Munro MD, Cornelius     Cancer [facility])    cecal carcinoma   Chest pain 10/22/2012   Colon cancer ([place]) 02/01/2012   Stage I (T2, N0, M0) cancer the cecum status post resection by Dr. Arnoldo Morale with 14 lymph nodes found all of which were negative. She had no evidence for metastatic disease on CT scan of the abdomen and pelvis either. Her date of surgery was 10/22/2011.  Not in need of chemotherapy for her stage I cancer.    GERD (gastroesophageal reflux disease)    HTN (hypertension)    Hyperlipidemia    Iron deficiency anemia 02/01/2012   At time of colon cancer presentation   Knee joint pain 2014   right   Lipoma of right forearm s/p excision 07/06/2018    OA (osteoarthritis) of knee 06/27/2012   Osteoporosis    Rotator cuff tear arthropathy 12/11/2013   Small bowel obstruction ([place]) 09/14/2017    Patient Active Problem List   Diagnosis Date Noted   Nodule of skin of right foot 07/29/2020   Low back pain 07/03/2020   Carpal tunnel syndrome of right wrist 11/06/2019   Need for immunization against influenza 11/06/2019   Osteoporosis of multiple sites 09/27/2019    Osteoarthritis    Primary osteoarthritis of right knee 10/03/2013   Spinal stenosis of lumbar region 03/01/2008   HYPERTENSION, BENIGN ESSENTIAL 01/19/2008   HLD (hyperlipidemia) 10/07/2006   OVERACTIVE BLADDER 10/07/2006    Past Surgical History:  Procedure Laterality Date   ABDOMINAL HYSTERECTOMY     partial   APPENDECTOMY     [facility]   CATARACT EXTRACTION Bilateral    CESAREAN SECTION     COLON SURGERY     dental implant     LIPOMA EXCISION Right 07/06/2018   Procedure: EXCISION LIPOMA RIGHT FOREARM;  Surgeon: Carole Civil, MD;  Location: [facility];  Service: Orthopedics;  Laterality: Right;   PARTIAL COLECTOMY  10/22/2011   Procedure: PARTIAL COLECTOMY;  Surgeon: Jamesetta So, MD;  Location: [facility];  Service: General;  Laterality: N/A;   TOTAL KNEE ARTHROPLASTY Right 01/30/2014   Procedure: TOTAL KNEE ARTHROPLASTY;  Surgeon: Carole Civil, MD;  Location: [facility];  Service: Orthopedics;  Laterality: Right;    OB History   No obstetric history on file.      Home Medications    Prior to Admission medications   Medication Sig Start Date End Date Taking? Authorizing Provider  acetaminophen (TYLENOL) 500 MG tablet Take 500 mg by mouth every 6 (six) hours as needed.  [provider]  alendronate (FOSAMAX) 70 MG tablet Take 1 tablet (70 mg total) by mouth once a week. Take with a full glass of water on an empty stomach. Patient not taking: Reported on 12/10/2020 05/22/20   Lindell Spar, MD  amLODipine (NORVASC) 5 MG tablet Take 1 tablet (5 mg total) by mouth daily. 07/29/20   Noreene Larsson, NP  aspirin 81 MG EC tablet Take 81 mg by mouth daily.     [provider]  cyclobenzaprine (FLEXERIL) 5 MG tablet Take 1 tablet (5 mg total) by mouth 3 (three) times daily as needed for muscle spasms. Patient not taking: Reported on 12/10/2020 07/29/20   Noreene Larsson, NP  ferrous sulfate 325 (65 FE) MG tablet Take 325 mg by mouth 2 (two) times daily with a meal.     [provider]  gabapentin (NEURONTIN) 100 MG capsule Take 1 capsule (100 mg total) by mouth daily. Patient not taking: Reported on 12/10/2020 07/03/20   Noreene Larsson, NP  montelukast (SINGULAIR) 10 MG tablet Take 1 tablet (10 mg total) by mouth at bedtime. Patient not taking: Reported on 12/10/2020 07/10/20   Fayrene Helper, MD  Multiple Vitamin (MULTIVITAMIN WITH MINERALS) TABS tablet Take 1 tablet by mouth daily.    [provider]  triamterene-hydrochlorothiazide (MAXZIDE-25) 37.5-25 MG tablet Take 1 tablet by mouth daily. 07/29/20   Noreene Larsson, NP    Family History Family History  Problem Relation Age of Onset   Hypertension Mother    Cancer Sister    Breast cancer Daughter    Hypertension Daughter    Colon cancer Neg Hx    Liver disease Neg Hx     Social History Social History   Tobacco Use   Smoking status: Never   Smokeless tobacco: Never  Vaping Use   Vaping Use: Never used  Substance Use Topics   Alcohol use: No   Drug use: No     Allergies   Aspirin   Review of Systems Review of Systems Per HPI  Physical Exam Triage Vital Signs ED Triage Vitals  Enc Vitals Group     BP 01/02/21 1522 126/75     Pulse Rate 01/02/21 1522 93     Resp 01/02/21 1522 18     Temp 01/02/21 1522 (!) 97.5 F (36.4 C)     Temp Source 01/02/21 1522 Oral     SpO2 01/02/21 1522 97 %     Weight --      Height --      Head Circumference --      Peak Flow --      Pain Score 01/02/21 1523 9     Pain Loc --      Pain Edu? --      Excl. in [place]? --    No data found.  Updated Vital Signs BP 126/75 (BP Location: Right Arm)    Pulse 93    Temp (!) 97.5 F (36.4 C) (Oral)    Resp 18    SpO2 97%   Visual Acuity Right Eye Distance:   Left Eye Distance:   Bilateral Distance:    Right Eye Near:   Left Eye Near:    Bilateral Near:     Physical Exam Vitals and nursing note reviewed.  Constitutional:      Appearance: Normal appearance. She is not  ill-appearing.  HENT:     Head: Atraumatic.  Eyes:     Extraocular Movements:  Extraocular movements intact.     Conjunctiva/sclera: Conjunctivae normal.  Cardiovascular:     Rate and Rhythm: Normal rate and regular rhythm.     Pulses: Normal pulses.     Heart sounds: Normal heart sounds.  Pulmonary:     Effort: Pulmonary effort is normal.     Breath sounds: Normal breath sounds.  Musculoskeletal:        General: Tenderness present. No swelling, deformity or signs of injury. Normal range of motion.     Cervical back: Normal range of motion and neck supple.     Comments: Tenderness to palpation anterior and posterior right shoulder.  Range of motion is intact to her baseline per patient  Skin:    General: Skin is warm and dry.     Findings: No bruising or erythema.  Neurological:     Mental Status: She is alert and oriented to person, place, and time.     Sensory: No sensory deficit.     Motor: No weakness.     Gait: Gait normal.  Psychiatric:        Mood and Affect: Mood normal.        Thought Content: Thought content normal.        Judgment: Judgment normal.     UC Treatments / Results  Labs (all labs ordered are listed, but only abnormal results are displayed) Labs Reviewed - No data to display  EKG   Radiology DG Shoulder Right  Result Date: 01/02/2021 CLINICAL DATA:  Acute on chronic right shoulder pain without known injury. EXAM: RIGHT SHOULDER - 2+ VIEW COMPARISON:  None. FINDINGS: There is no evidence of fracture or dislocation. Severe degenerative changes seen involving the right acromioclavicular joint. Soft tissues are unremarkable. IMPRESSION: Severe degenerative joint disease of right acromioclavicular joint. No acute abnormality is noted. Electronically Signed   By: Marijo Conception M.D.   On: 01/02/2021 15:59    Procedures Procedures (including critical care time)  Medications Ordered in UC Medications  dexamethasone (DECADRON) injection 10 mg (10 mg  Intramuscular Given 01/02/21 1609)    Initial Impression / Assessment and Plan / UC Course  I have reviewed the triage vital signs and the nursing notes.  Pertinent labs & imaging results that were available during my care of the patient were reviewed by me and considered in my medical decision making (see chart for details).     X-ray of the right shoulder showing severe arthritic changes.  Suspect her pain is inflammatory at this time related to this.  We will treat with IM Decadron and have her follow-up closely with orthopedics for ongoing evaluation and management.  Continue Tylenol as needed additionally.  Final Clinical Impressions(s) / UC Diagnoses   Final diagnoses:  Primary osteoarthritis of right shoulder   Discharge Instructions   None    ED Prescriptions   None    PDMP not reviewed this encounter.   Josee, [place], [place] 01/02/21 [phone]

## 2021-01-02 NOTE — ED Triage Notes (Signed)
Right arm pain that is worse at night time for several weeks.  Pain from shoulder that runs down arm.  States tylenol helps some with the pain.

## 2021-04-20 ENCOUNTER — Encounter: Payer: Self-pay | Admitting: Orthopedic Surgery

## 2021-04-20 ENCOUNTER — Ambulatory Visit: Payer: Medicare Other | Admitting: Orthopedic Surgery

## 2021-04-20 VITALS — BP 126/78 | HR 88 | Ht 63.0 in | Wt 128.0 lb

## 2021-04-20 DIAGNOSIS — M25511 Pain in right shoulder: Secondary | ICD-10-CM | POA: Diagnosis not present

## 2021-04-26 ENCOUNTER — Ambulatory Visit
Admission: EM | Admit: 2021-04-26 | Discharge: 2021-04-26 | Disposition: A | Discharge status: Home / Self Care | Payer: Medicare Other | Attending: Family Medicine | Admitting: Family Medicine

## 2021-04-26 DIAGNOSIS — R1084 Generalized abdominal pain: Secondary | ICD-10-CM

## 2021-04-26 DIAGNOSIS — R112 Nausea with vomiting, unspecified: Secondary | ICD-10-CM | POA: Diagnosis not present

## 2021-04-26 MED ORDER — SUCRALFATE 1 G PO TABS: 1.0000 g | ORAL_TABLET | Freq: Three times a day (TID) | ORAL | 0 refills | Status: DC | PRN

## 2021-04-26 MED ORDER — ONDANSETRON 4 MG PO TBDP
4.0000 mg | ORAL_TABLET | Freq: Once | ORAL | Status: AC
  Administered 2021-04-26: 4 mg via ORAL

## 2021-04-26 MED ORDER — ONDANSETRON 4 MG PO TBDP: 4.0000 mg | ORAL_TABLET | Freq: Three times a day (TID) | ORAL | 0 refills | Status: DC | PRN

## 2021-04-26 MED ORDER — ONDANSETRON HCL 4 MG/2ML IJ SOLN: 4.0000 mg | Freq: Once | INTRAMUSCULAR | Status: DC

## 2021-04-26 MED ORDER — PANTOPRAZOLE SODIUM 40 MG PO TBEC: 40.0000 mg | DELAYED_RELEASE_TABLET | Freq: Every day | ORAL | 0 refills | Status: DC

## 2021-04-26 NOTE — ED Provider Notes (Signed)
?[facility] ? ? ? ?CSN: [id number] ?Arrival date & time: 04/26/21  1410 ? ? ?  ? ?History   ?Chief Complaint ?Chief Complaint  ?Patient presents with  ? Emesis  ?  Stomach pain and vomiting  ? ? ?HPI ?Sandra Williams is a 88 y.o. female.  ? ?Presenting today with off and on stomach issues for several months now, states she is always feeling nauseated with bloating and vague abdominal discomfort but now this morning she vomited and it tasted better.  She denies fever, chills, bowel changes, urinary symptoms, intolerance to p.o., new medications or diet changes, sick contacts.  Has not been trying anything over-the-counter for symptoms.  No known history of chronic GI issues per patient though on problem list she does have a history of acid reflux.  Past abdominal surgeries include abdominal hysterectomy, appendectomy, colon surgery. ? ? ?Past Medical History:  ?Diagnosis Date  ? Arthritis   ? ARTHRITIS 10/07/2006  ? Qualifier: Diagnosis of  By: Jonna Munro MD, Roderic Scarce    ? Cancer [facility])   ? cecal carcinoma  ? Chest pain 10/22/2012  ? Colon cancer ([place]) 02/01/2012  ? 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 Arnoldo Morale with 14 lymph nodes found all of which were negative. She had no evidence for metastatic disease on CT scan of the abdomen and pelvis either. Her date of surgery was 10/22/2011.  Not in need of chemotherapy for her stage I cancer.   ? GERD (gastroesophageal reflux disease)   ? HTN (hypertension)   ? Hyperlipidemia   ? Iron deficiency anemia 02/01/2012  ? At time of colon cancer presentation  ? Knee joint pain 2014  ? right  ? Lipoma of right forearm s/p excision 07/06/2018   ? OA (osteoarthritis) of knee 06/27/2012  ? Osteoporosis   ? Rotator cuff tear arthropathy 12/11/2013  ? Small bowel obstruction ([place]) 09/14/2017  ? ? ?Patient Active Problem List  ? Diagnosis Date Noted  ? Nodule of skin of right foot 07/29/2020  ? Low back pain 07/03/2020  ? Carpal tunnel syndrome of  right wrist 11/06/2019  ? Need for immunization against influenza 11/06/2019  ? Osteoporosis of multiple sites 09/27/2019  ? Osteoarthritis   ? Primary osteoarthritis of right knee 10/03/2013  ? Spinal stenosis of lumbar region 03/01/2008  ? HYPERTENSION, BENIGN ESSENTIAL 01/19/2008  ? HLD (hyperlipidemia) 10/07/2006  ? OVERACTIVE BLADDER 10/07/2006  ? ? ?Past Surgical History:  ?Procedure Laterality Date  ? ABDOMINAL HYSTERECTOMY    ? partial  ? APPENDECTOMY    ? [facility]  ? CATARACT EXTRACTION Bilateral   ? CESAREAN SECTION    ? COLON SURGERY    ? dental implant    ? LIPOMA EXCISION Right 07/06/2018  ? Procedure: EXCISION LIPOMA RIGHT FOREARM;  Surgeon: Carole Civil, MD;  Location: [facility];  Service: Orthopedics;  Laterality: Right;  ? PARTIAL COLECTOMY  10/22/2011  ? Procedure: PARTIAL COLECTOMY;  Surgeon: Jamesetta So, MD;  Location: [facility];  Service: General;  Laterality: N/A;  ? TOTAL KNEE ARTHROPLASTY Right 01/30/2014  ? Procedure: TOTAL KNEE ARTHROPLASTY;  Surgeon: Carole Civil, MD;  Location: [facility];  Service: Orthopedics;  Laterality: Right;  ? ? ?OB History   ?No obstetric history on file. ?  ? ? ? ?Home Medications   ? ?Prior to Admission medications   ?Medication Sig Start Date End Date Taking? Authorizing Provider  ?ondansetron (ZOFRAN-ODT) 4 MG disintegrating tablet Take 1  tablet (4 mg total) by mouth every 8 (eight) hours as needed for nausea or vomiting. 04/26/21  Yes Volney American, PA-C  ?pantoprazole (PROTONIX) 40 MG tablet Take 1 tablet (40 mg total) by mouth daily. 04/26/21  Yes Volney American, PA-C  ?sucralfate (CARAFATE) 1 g tablet Take 1 tablet (1 g total) by mouth 3 (three) times daily as needed. May dissolve a tablet in a glass of water and drink as needed 04/26/21  Yes Volney American, PA-C  ?acetaminophen (TYLENOL) 500 MG tablet Take 500 mg by mouth every 6 (six) hours as needed.    [provider]  ?alendronate (FOSAMAX) 70 MG tablet Take 1 tablet (70  mg total) by mouth once a week. Take with a full glass of water on an empty stomach. 05/22/20   Lindell Spar, MD  ?amLODipine (NORVASC) 5 MG tablet Take 1 tablet (5 mg total) by mouth daily. 07/29/20   Noreene Larsson, NP  ?aspirin 81 MG EC tablet Take 81 mg by mouth daily.     [provider]  ?cyclobenzaprine (FLEXERIL) 5 MG tablet Take 1 tablet (5 mg total) by mouth 3 (three) times daily as needed for muscle spasms. ?Patient not taking: Reported on 12/10/2020 07/29/20   Noreene Larsson, NP  ?ferrous sulfate 325 (65 FE) MG tablet Take 325 mg by mouth 2 (two) times daily with a meal.    [provider]  ?gabapentin (NEURONTIN) 100 MG capsule Take 1 capsule (100 mg total) by mouth daily. ?Patient not taking: Reported on 12/10/2020 07/03/20   Noreene Larsson, NP  ?montelukast (SINGULAIR) 10 MG tablet Take 1 tablet (10 mg total) by mouth at bedtime. ?Patient not taking: Reported on 12/10/2020 07/10/20   Fayrene Helper, MD  ?Multiple Vitamin (MULTIVITAMIN WITH MINERALS) TABS tablet Take 1 tablet by mouth daily. ?Patient not taking: Reported on 04/20/2021    [provider]  ?triamterene-hydrochlorothiazide (MAXZIDE-25) 37.5-25 MG tablet Take 1 tablet by mouth daily. 07/29/20   Noreene Larsson, NP  ? ? ?Family History ?Family History  ?Problem Relation Age of Onset  ? Hypertension Mother   ? Cancer Sister   ? Breast cancer Daughter   ? Hypertension Daughter   ? Colon cancer Neg Hx   ? Liver disease Neg Hx   ? ? ?Social History ?Social History  ? ?Tobacco Use  ? Smoking status: Never  ? Smokeless tobacco: Never  ?Vaping Use  ? Vaping Use: Never used  ?Substance Use Topics  ? Alcohol use: No  ? Drug use: No  ? ? ? ?Allergies   ?Aspirin ? ? ?Review of Systems ?Review of Systems ?Per HPI ? ?Physical Exam ?Triage Vital Signs ?ED Triage Vitals  ?Enc Vitals Group  ?   BP 04/26/21 1516 121/75  ?   Pulse Rate 04/26/21 1516 100  ?   Resp 04/26/21 1516 16  ?   Temp 04/26/21 1516 (!) 97.4 ?F (36.3 ?C)  ?   Temp  Source 04/26/21 1516 Oral  ?   SpO2 04/26/21 1516 97 %  ?   Weight --   ?   Height --   ?   Head Circumference --   ?   Peak Flow --   ?   Pain Score 04/26/21 1517 0  ?   Pain Loc --   ?   Pain Edu? --   ?   Excl. in [place]? --   ? ?No data found. ? ?Updated Vital  Signs ?BP 121/75 (BP Location: Right Arm)   Pulse 100   Temp (!) 97.4 ?F (36.3 ?C) (Oral)   Resp 16   SpO2 97%  ? ?Visual Acuity ?Right Eye Distance:   ?Left Eye Distance:   ?Bilateral Distance:   ? ?Right Eye Near:   ?Left Eye Near:    ?Bilateral Near:    ? ?Physical Exam ?Vitals and nursing note reviewed.  ?Constitutional:   ?   Appearance: Normal appearance. She is not ill-appearing.  ?HENT:  ?   Head: Atraumatic.  ?   Mouth/Throat:  ?   Mouth: Mucous membranes are moist.  ?Eyes:  ?   Extraocular Movements: Extraocular movements intact.  ?   Conjunctiva/sclera: Conjunctivae normal.  ?Cardiovascular:  ?   Rate and Rhythm: Normal rate.  ?Pulmonary:  ?   Effort: Pulmonary effort is normal.  ?   Breath sounds: Normal breath sounds.  ?Abdominal:  ?   General: Bowel sounds are normal. There is no distension.  ?   Palpations: Abdomen is soft. There is no mass.  ?   Tenderness: There is abdominal tenderness. There is no right CVA tenderness, left CVA tenderness, guarding or rebound.  ?   Comments: Minimal left upper quadrant tenderness to palpation without distention or guarding  ?Musculoskeletal:     ?   General: Normal range of motion.  ?   Cervical back: Normal range of motion and neck supple.  ?Skin: ?   General: Skin is warm and dry.  ?Neurological:  ?   Mental Status: She is alert and oriented to person, place, and time.  ?Psychiatric:     ?   Mood and Affect: Mood normal.     ?   Thought Content: Thought content normal.     ?   Judgment: Judgment normal.  ? ? ? ?UC Treatments / Results  ?Labs ?(all labs ordered are listed, but only abnormal results are displayed) ?Labs Reviewed - No data to display ? ?EKG ? ? ?Radiology ?No results  found. ? ?Procedures ?Procedures (including critical care time) ? ?Medications Ordered in UC ?Medications  ?ondansetron (ZOFRAN-ODT) disintegrating tablet 4 mg (4 mg Oral Given 04/26/21 1523)  ? ? ?Initial Impression / Assessment a

## 2021-04-26 NOTE — ED Triage Notes (Signed)
Pt states she has had stomach issues for a while and she vomited today one time and it tasted bitter ? ? ?

## 2021-04-27 NOTE — Progress Notes (Signed)
FOLLOW UP  ? ?Encounter Diagnosis  ?Name Primary?  ? Acute pain of right shoulder Yes  ? ? ? ?Chief Complaint  ?Patient presents with  ? Shoulder Pain  ?  RT/ radiates down length of arm  ? ? ?The patient has pain in her right shoulder it radiates into her right arm but not below the elbow ? ?She has painful range of motion decreased range of motion on exam imaging was done January 02, 2021 mild glenohumeral arthritis is noted there is subtle proximal migration of the humeral head suggesting chronic rotator cuff tendinopathy ? ?The patient is 88 years old recommend injection right shoulder ? ?Procedure note the subacromial injection shoulder left  ? ?Verbal consent was obtained to inject the  Left   Shoulder ? ?Timeout was completed to confirm the injection site is a subacromial space of the  left  shoulder ? ?Medication used Depo-Medrol 40 mg and lidocaine 1% 3 cc ? ?Anesthesia was provided by ethyl chloride ? ?The injection was performed in the left  posterior subacromial space. After pinning the skin with alcohol and anesthetized the skin with ethyl chloride the subacromial space was injected using a 20-gauge needle. There were no complications ? ?Sterile dressing was applied. ? ? ? ? ? ? ? ? ? ? ?

## 2021-05-01 ENCOUNTER — Encounter: Payer: Self-pay | Admitting: Nurse Practitioner

## 2021-05-01 ENCOUNTER — Ambulatory Visit (INDEPENDENT_AMBULATORY_CARE_PROVIDER_SITE_OTHER): Payer: Medicare Other | Admitting: Nurse Practitioner

## 2021-05-01 VITALS — BP 138/68 | HR 74 | Ht 59.0 in | Wt 127.0 lb

## 2021-05-01 DIAGNOSIS — K219 Gastro-esophageal reflux disease without esophagitis: Secondary | ICD-10-CM | POA: Diagnosis not present

## 2021-05-01 DIAGNOSIS — R5382 Chronic fatigue, unspecified: Secondary | ICD-10-CM | POA: Insufficient documentation

## 2021-05-01 DIAGNOSIS — E785 Hyperlipidemia, unspecified: Secondary | ICD-10-CM

## 2021-05-01 DIAGNOSIS — I10 Essential (primary) hypertension: Secondary | ICD-10-CM

## 2021-05-01 MED ORDER — AMLODIPINE BESYLATE 5 MG PO TABS: 5.0000 mg | ORAL_TABLET | Freq: Every day | ORAL | 3 refills | Status: DC

## 2021-05-01 MED ORDER — TRIAMTERENE-HCTZ 37.5-25 MG PO TABS: 1.0000 | ORAL_TABLET | Freq: Every day | ORAL | 3 refills | Status: DC

## 2021-05-01 NOTE — Assessment & Plan Note (Addendum)
BP Readings from Last 3 Encounters:  ?05/01/21 138/68  ?04/26/21 121/75  ?04/20/21 126/78  ?Chronic condition well-controlled on amlodipine 5 mg daily, triamterene-" chlorothiazide 37.5-25 mg daily. ?Continue current medications ?DASH diet advised walking exercise at least 30 minutes daily as tolerated ?Medications refilled ?Check CMP. ?

## 2021-05-01 NOTE — Patient Instructions (Addendum)
Please get your TDAP vaccine, shingles vaccine, covid booster  and pneumonia vaccine at your pharmacy.  ? ?Pleas get your fasting labs done on Monday  ? ?It is important that you exercise regularly as tolerated at  least 30 minutes 5 times a week.  ?Think about what you will eat, plan ahead. ?Choose " clean, green, fresh or frozen" over canned, processed or packaged foods which are more sugary, salty and fatty. ?70 to 75% of food eaten should be vegetables and fruit. ?Three meals at set times with snacks allowed between meals, but they must be fruit or vegetables. ?Aim to eat over a 12 hour period , example 7 am to 7 pm, and STOP after  your last meal of the day. ?Drink water,generally about 64 ounces per day, no other drink is as healthy. Fruit juice is best enjoyed in a healthy way, by EATING the fruit. ? ?Thanks for choosing [facility], we consider it a privelige to serve you.  ? ? ? ?

## 2021-05-01 NOTE — Assessment & Plan Note (Signed)
Recent ER visit for GERD ?She was not on medications prior to her ED visit ?Was ordered Protonix 40 mg daily, Carafate 1 g 3 times daily as needed, n Zofran as needed.  Patient stated that she had stopped taking medications after 3 days because has symptoms of resolved ?Patient told to take medications as needed ?Avoid spicy foods fatty foods coffee chocolates.  ?

## 2021-05-01 NOTE — Progress Notes (Signed)
? ?  Sandra Williams     MRN: [id number]      DOB: [birth year] ? ? ?HPI ?Ms. [place] with past medical history of hypertension, GERD, iron deficiency anemia, stage I colon cancer osteoporosis of multiple sites, overactive bladder, hyperlipidemia, is here for follow up and re-evaluation of chronic medical conditions, medication management  ?  ? ?She went to the ED 5 days ago for complaints of abdominal discomfort nausea bloating stated that she was ordered medications for GERD  states that she feels better after taking protonix, carafate for 3 days.  Patient denies current abdominal pain, nausea, vomiting, bloating.  ? ?Pt c/o chronic fatigue, ongoing for several months.  Patient denies fever, chills, nausea, vomiting, recent change in her weight.  States that she has been eating well ? ?Due for Tdap vaccine shingles vaccine COVID booster and pneumonia vaccines need for all vaccines discussed with patient patient encouraged to get her vaccines at her pharmacy. ? ? ?ROS ?Denies recent fever or chills. ?Denies sinus pressure, nasal congestion, ear pain or sore throat. ?Denies chest congestion, productive cough or wheezing. ?Denies chest pains, palpitations and leg swelling ?Denies abdominal pain, nausea, vomiting,diarrhea or constipation.   ?Denies dysuria, frequency, hesitancy or incontinence. ?Denies joint pain, swelling and limitation in mobility. ?Denies headaches, seizures, numbness, or tingling. ?Denies depression, anxiety or insomnia. ? ? ?PE ? ?BP 138/68 (BP Location: Left Arm, Patient Position: Sitting, Cuff Size: Normal)   Pulse 74   Ht '4\' 11"'$  (1.499 m)   Wt 127 lb (57.6 kg)   SpO2 99%   BMI 25.65 kg/m?  ? ?Patient alert and oriented and in no cardiopulmonary distress. ? ?Chest: Clear to auscultation bilaterally. ? ?CVS: S1, S2 no murmurs, no S3.Regular rate. ? ?ABD: Soft non tender.  ? ?Ext: No edema ? ?IW:[id number] ROM spine, shoulders, hips and knees using a cane  ? ?Psych: Good eye contact, normal  affect. Memory intact not anxious or depressed appearing. ? ? ?Assessment & Plan ? ?HYPERTENSION, BENIGN ESSENTIAL ?BP Readings from Last 3 Encounters:  ?05/01/21 138/68  ?04/26/21 121/75  ?04/20/21 126/78  ?Chronic condition well-controlled on amlodipine 5 mg daily, triamterene-" chlorothiazide 37.5-25 mg daily. ?Continue current medications ?DASH diet advised walking exercise at least 30 minutes daily as tolerated ?Medications refilled ?Check CMP. ? ?HLD (hyperlipidemia) ?Lab Results  ?Component Value Date  ? CHOL 261 (H) 06/04/2020  ? HDL 88 06/04/2020  ? LDLCALC 154 (H) 06/04/2020  ? TRIG 109 06/04/2020  ? CHOLHDL 3.2 Ratio 08/14/2008  ?Not on medications ?Check lipid panel ? ?Chronic fatigue ?Chronic condition ?Has history of anemia ?Check CBC, thyroid function, A1c ?Engage in regular walking exercises at least 30 minutes daily ? ?GERD (gastroesophageal reflux disease) ?Recent ER visit for GERD ?She was not on medications prior to her ED visit ?Was ordered Protonix 40 mg daily, Carafate 1 g 3 times daily as needed, n Zofran as needed.  Patient stated that she had stopped taking medications after 3 days because has symptoms of resolved ?Patient told to take medications as needed ?Avoid spicy foods fatty foods coffee chocolates.   ?

## 2021-05-01 NOTE — Assessment & Plan Note (Signed)
Lab Results  ?Component Value Date  ? CHOL 261 (H) 06/04/2020  ? HDL 88 06/04/2020  ? LDLCALC 154 (H) 06/04/2020  ? TRIG 109 06/04/2020  ? CHOLHDL 3.2 Ratio 08/14/2008  ?Not on medications ?Check lipid panel ?

## 2021-05-01 NOTE — Assessment & Plan Note (Signed)
Chronic condition ?Has history of anemia ?Check CBC, thyroid function, A1c ?Engage in regular walking exercises at least 30 minutes daily ?

## 2021-05-04 DIAGNOSIS — Z131 Encounter for screening for diabetes mellitus: Secondary | ICD-10-CM | POA: Diagnosis not present

## 2021-05-04 DIAGNOSIS — I10 Essential (primary) hypertension: Secondary | ICD-10-CM | POA: Diagnosis not present

## 2021-05-04 DIAGNOSIS — E785 Hyperlipidemia, unspecified: Secondary | ICD-10-CM | POA: Diagnosis not present

## 2021-05-04 DIAGNOSIS — R5382 Chronic fatigue, unspecified: Secondary | ICD-10-CM | POA: Diagnosis not present

## 2021-05-05 LAB — CMP14+EGFR
ALT: 12 IU/L (ref 0–32)
AST: 19 IU/L (ref 0–40)
Albumin/Globulin Ratio: 1.6 (ref 1.2–2.2)
Albumin: 4.1 g/dL (ref 3.5–4.6)
Alkaline Phosphatase: 62 IU/L (ref 44–121)
BUN/Creatinine Ratio: 19 (ref 12–28)
BUN: 20 mg/dL (ref 10–36)
Bilirubin Total: 0.4 mg/dL (ref 0.0–1.2)
CO2: 24 mmol/L (ref 20–29)
Calcium: 9.4 mg/dL (ref 8.7–10.3)
Chloride: 105 mmol/L (ref 96–106)
Creatinine, Ser: 1.05 mg/dL — ABNORMAL HIGH (ref 0.57–1.00)
Globulin, Total: 2.6 g/dL (ref 1.5–4.5)
Glucose: 90 mg/dL (ref 70–99)
Potassium: 4.9 mmol/L (ref 3.5–5.2)
Sodium: 143 mmol/L (ref 134–144)
Total Protein: 6.7 g/dL (ref 6.0–8.5)
eGFR: 49 mL/min/{1.73_m2} — ABNORMAL LOW (ref >59)

## 2021-05-05 LAB — CBC WITH DIFFERENTIAL/PLATELET
Basophils Absolute: 0.1 10*3/uL (ref 0.0–0.2)
Basos: 1 %
EOS (ABSOLUTE): 0.3 10*3/uL (ref 0.0–0.4)
Eos: 4 %
Hematocrit: 37.2 % (ref 34.0–46.6)
Hemoglobin: 12.3 g/dL (ref 11.1–15.9)
Immature Grans (Abs): 0 10*3/uL (ref 0.0–0.1)
Immature Granulocytes: 0 %
Lymphocytes Absolute: 2 10*3/uL (ref 0.7–3.1)
Lymphs: 29 %
MCH: 28.1 pg (ref 26.6–33.0)
MCHC: 33.1 g/dL (ref 31.5–35.7)
MCV: 85 fL (ref 79–97)
Monocytes Absolute: 0.7 10*3/uL (ref 0.1–0.9)
Monocytes: 10 %
Neutrophils Absolute: 3.8 10*3/uL (ref 1.4–7.0)
Neutrophils: 56 %
Platelets: 358 10*3/uL (ref 150–450)
RBC: 4.37 x10E6/uL (ref 3.77–5.28)
RDW: 13.7 % (ref 11.7–15.4)
WBC: 6.8 10*3/uL (ref 3.4–10.8)

## 2021-05-05 LAB — HEMOGLOBIN A1C
Est. average glucose Bld gHb Est-mCnc: 131 mg/dL
Hgb A1c MFr Bld: 6.2 % — ABNORMAL HIGH (ref 4.8–5.6)

## 2021-05-05 LAB — LIPID PANEL
Chol/HDL Ratio: 3.2 ratio (ref 0.0–4.4)
Cholesterol, Total: 231 mg/dL — ABNORMAL HIGH (ref 100–199)
HDL: 72 mg/dL (ref >39)
LDL Chol Calc (NIH): 135 mg/dL — ABNORMAL HIGH (ref 0–99)
Triglycerides: 138 mg/dL (ref 0–149)
VLDL Cholesterol Cal: 24 mg/dL (ref 5–40)

## 2021-05-05 LAB — TSH: TSH: 0.866 u[IU]/mL (ref 0.450–4.500)

## 2021-05-05 NOTE — Progress Notes (Signed)
Prediabetes, avoid sugar sweets soda ? ?Hyperlipidemia Eat a healthy diet, including lots of fruits and vegetables. ?Avoid foods with a lot of saturated and trans fats, such as red meat, butter, fried foods and cheese . ?Maintain a healthy weight. ? ?Kidney function slightly decline, avoid ibuprofen , drink at least 64 ounces of water daily.  ?Other labs are normal  ?

## 2021-06-19 ENCOUNTER — Other Ambulatory Visit: Payer: Self-pay

## 2021-06-19 MED ORDER — ALENDRONATE SODIUM 70 MG PO TABS: 70.0000 mg | ORAL_TABLET | ORAL | 0 refills | Status: DC

## 2021-09-04 ENCOUNTER — Ambulatory Visit: Payer: Medicare Other | Admitting: Nurse Practitioner

## 2021-09-17 ENCOUNTER — Ambulatory Visit (INDEPENDENT_AMBULATORY_CARE_PROVIDER_SITE_OTHER): Payer: Medicare Other | Admitting: Nurse Practitioner

## 2021-09-17 ENCOUNTER — Encounter: Payer: Self-pay | Admitting: Nurse Practitioner

## 2021-09-17 VITALS — BP 127/77 | HR 76 | Ht 60.0 in | Wt 131.0 lb

## 2021-09-17 DIAGNOSIS — G5601 Carpal tunnel syndrome, right upper limb: Secondary | ICD-10-CM | POA: Diagnosis not present

## 2021-09-17 DIAGNOSIS — I10 Essential (primary) hypertension: Secondary | ICD-10-CM | POA: Diagnosis not present

## 2021-09-17 DIAGNOSIS — R600 Localized edema: Secondary | ICD-10-CM | POA: Diagnosis not present

## 2021-09-17 DIAGNOSIS — K219 Gastro-esophageal reflux disease without esophagitis: Secondary | ICD-10-CM | POA: Diagnosis not present

## 2021-09-17 DIAGNOSIS — E782 Mixed hyperlipidemia: Secondary | ICD-10-CM | POA: Diagnosis not present

## 2021-09-17 NOTE — Assessment & Plan Note (Signed)
Patient told to keep her legs elevated when sitting, wear compression socks, avoid salty foods to help with swelling.

## 2021-09-17 NOTE — Assessment & Plan Note (Signed)
Chronic medical condition well-controlled She has stopped taking GERD. Avoid  fried fatty foods, spicy foods

## 2021-09-17 NOTE — Assessment & Plan Note (Addendum)
She has stopped taking gabapentin does not want to be taking a lot of medication Currently denies pain , wear hand brace as needed.has palpable radial pulse, skin warm and dry

## 2021-09-17 NOTE — Patient Instructions (Signed)
Please keep your legs elevated when sitting , wear compression socks to help decrease swelling , avoid salty foods.    It is important that you exercise regularly at least 30 minutes 5 times a week as tolerated   Think about what you will eat, plan ahead. Choose " clean, green, fresh or frozen" over canned, processed or packaged foods which are more sugary, salty and fatty. 70 to 75% of food eaten should be vegetables and fruit. Three meals at set times with snacks allowed between meals, but they must be fruit or vegetables. Aim to eat over a 12 hour period , example 7 am to 7 pm, and STOP after  your last meal of the day. Drink water,generally about 64 ounces per day, no other drink is as healthy. Fruit juice is best enjoyed in a healthy way, by EATING the fruit.  Thanks for choosing [facility], we consider it a privelige to serve you.

## 2021-09-17 NOTE — Progress Notes (Signed)
Established Patient Office Visit  Subjective:  Patient ID: Sandra Williams, female    DOB: [birth year]  Age: 88 y.o. MRN: [id number]  CC:  Chief Complaint  Patient presents with   Hyperlipidemia    4 month f/u    Foot Swelling    Bilateral since around 04/17/21    HPI Sandra Williams is a 88 y.o. female with past medical history of hypertension, GERD, carpal tunnel syndrome of right wrist osteoarthritis hyperlipidemia presents for f/u of her chronic medical conditions.   Hypertension .currently on amlodipine 5 mg daily .she has stopped taking Maxizide .she cannot remember the last time she took the med ,has Intermittent Bilateral leg swelling for the past few months , she loves to eat salt , denies pain, numbness, tingling , fever, chills, dizziness, chest pain, syncope.    Right wrist feels numb wrist hand braces as needed , she denies pain, tingling .   Patient declines flu vaccine today, she was encouraged to get her TDAP vaccine , shingles vaccine at the pharmacy   Past Medical History:  Diagnosis Date   Arthritis    ARTHRITIS 10/07/2006   Qualifier: Diagnosis of  By: Jonna Munro MD, Cornelius     Cancer [facility])    cecal carcinoma   Chest pain 10/22/2012   Colon cancer ([place]) 02/01/2012   Stage I (T2, N0, M0) cancer the cecum status post resection by Dr. Arnoldo Morale with 14 lymph nodes found all of which were negative. She had no evidence for metastatic disease on CT scan of the abdomen and pelvis either. Her date of surgery was 10/22/2011.  Not in need of chemotherapy for her stage I cancer.    GERD (gastroesophageal reflux disease)    HTN (hypertension)    Hyperlipidemia    Iron deficiency anemia 02/01/2012   At time of colon cancer presentation   Knee joint pain 2014   right   Lipoma of right forearm s/p excision 07/06/2018    OA (osteoarthritis) of knee 06/27/2012   Osteoporosis    Rotator cuff tear arthropathy 12/11/2013   Small bowel obstruction ([place]) 09/14/2017    Past  Surgical History:  Procedure Laterality Date   ABDOMINAL HYSTERECTOMY     partial   APPENDECTOMY     APH   CATARACT EXTRACTION Bilateral    CESAREAN SECTION     COLON SURGERY     dental implant     LIPOMA EXCISION Right 07/06/2018   Procedure: EXCISION LIPOMA RIGHT FOREARM;  Surgeon: Carole Civil, MD;  Location: [facility];  Service: Orthopedics;  Laterality: Right;   PARTIAL COLECTOMY  10/22/2011   Procedure: PARTIAL COLECTOMY;  Surgeon: Jamesetta So, MD;  Location: [facility];  Service: General;  Laterality: N/A;   TOTAL KNEE ARTHROPLASTY Right 01/30/2014   Procedure: TOTAL KNEE ARTHROPLASTY;  Surgeon: Carole Civil, MD;  Location: [facility];  Service: Orthopedics;  Laterality: Right;    Family History  Problem Relation Age of Onset   Hypertension Mother    Cancer Sister    Breast cancer Daughter    Hypertension Daughter    Colon cancer Neg Hx    Liver disease Neg Hx     Social History   Socioeconomic History   Marital status: Single    Spouse name: Not on file   Number of children: 3   Years of education: Not on file   Highest education level: Not on file  Occupational History   Not on file  Tobacco Use  Smoking status: Never   Smokeless tobacco: Never  Vaping Use   Vaping Use: Never used  Substance and Sexual Activity   Alcohol use: No   Drug use: No   Sexual activity: Never    Birth control/protection: Surgical  Other Topics Concern   Not on file  Social History Narrative   Lives with daughter in [place], [place]: sits on the porch- people watching, use to like bingo, walks around house a lot      Diet: eats all food groups   Caffeine: coffee- 3-4 days a week, some tea, drinks coke 3 times a week    Water:  4-5 cups roughly      Wears seat belt   No longer drives   Smoke detectors at home   No weapons she knows of    Social Determinants of Health   Financial Resource Strain: Low Risk  (09/27/2020)   Overall Financial Resource Strain  (CARDIA)    Difficulty of Paying Living Expenses: Not hard at all  Food Insecurity: No Food Insecurity (09/27/2020)   Hunger Vital Sign    Worried About Running Out of Food in the Last Year: Never true    [place] in the Last Year: Never true  Transportation Needs: No Transportation Needs (09/27/2020)   PRAPARE - Hydrologist (Medical): No    Lack of Transportation (Non-Medical): No  Physical Activity: Sufficiently Active (09/27/2020)   Exercise Vital Sign    Days of Exercise per Week: 4 days    Minutes of Exercise per Session: 60 min  Stress: No Stress Concern Present (09/27/2020)   [place]    Feeling of Stress : Not at all  Social Connections: Moderately Isolated (09/27/2020)   Social Connection and Isolation Panel [NHANES]    Frequency of Communication with Friends and Family: More than three times a week    Frequency of Social Gatherings with Friends and Family: More than three times a week    Attends Religious Services: More than 4 times per year    Active Member of [employer] or Organizations: No    Attends Archivist Meetings: Never    Marital Status: Never married  Intimate Partner Violence: Not At Risk (09/27/2020)   Humiliation, Afraid, Rape, and Kick questionnaire    Fear of Current or Ex-Partner: No    Emotionally Abused: No    Physically Abused: No    Sexually Abused: No    Outpatient Medications Prior to Visit  Medication Sig Dispense Refill   acetaminophen (TYLENOL) 500 MG tablet Take 500 mg by mouth every 6 (six) hours as needed.     alendronate (FOSAMAX) 70 MG tablet Take 1 tablet (70 mg total) by mouth once a week. Take with a full glass of water on an empty stomach. 30 tablet 0   amLODipine (NORVASC) 5 MG tablet Take 1 tablet (5 mg total) by mouth daily. 90 tablet 3   Ascorbic Acid (VITAMIN C ADULT GUMMIES PO) Take by mouth.     aspirin 81 MG EC tablet Take  81 mg by mouth daily.      ferrous sulfate 325 (65 FE) MG tablet Take 325 mg by mouth 2 (two) times daily with a meal. (Patient not taking: Reported on 09/17/2021)     gabapentin (NEURONTIN) 100 MG capsule Take 1 capsule (100 mg total) by mouth daily. (Patient not taking:  Reported on 12/10/2020) 30 capsule 2   montelukast (SINGULAIR) 10 MG tablet Take 1 tablet (10 mg total) by mouth at bedtime. (Patient not taking: Reported on 09/17/2021) 30 tablet 0   Multiple Vitamin (MULTIVITAMIN WITH MINERALS) TABS tablet Take 1 tablet by mouth daily. (Patient not taking: Reported on 04/20/2021)     ondansetron (ZOFRAN-ODT) 4 MG disintegrating tablet Take 1 tablet (4 mg total) by mouth every 8 (eight) hours as needed for nausea or vomiting. (Patient not taking: Reported on 05/01/2021) 20 tablet 0   pantoprazole (PROTONIX) 40 MG tablet Take 1 tablet (40 mg total) by mouth daily. (Patient not taking: Reported on 05/01/2021) 30 tablet 0   sucralfate (CARAFATE) 1 g tablet Take 1 tablet (1 g total) by mouth 3 (three) times daily as needed. May dissolve a tablet in a glass of water and drink as needed (Patient not taking: Reported on 05/01/2021) 90 tablet 0   triamterene-hydrochlorothiazide (MAXZIDE-25) 37.5-25 MG tablet Take 1 tablet by mouth daily. (Patient not taking: Reported on 09/17/2021) 90 tablet 3   cyclobenzaprine (FLEXERIL) 5 MG tablet Take 1 tablet (5 mg total) by mouth 3 (three) times daily as needed for muscle spasms. (Patient not taking: Reported on 12/10/2020) 30 tablet 1   No facility-administered medications prior to visit.    Allergies  Allergen Reactions   Aspirin     REACTION: Upset stomach if takes regular and uncoated if takes high dose    ROS Review of Systems  Constitutional: Negative.  Negative for activity change, appetite change and chills.  HENT: Negative.  Negative for congestion, dental problem and drooling.   Eyes: Negative.   Respiratory: Negative.  Negative for apnea, cough, choking  and chest tightness.   Cardiovascular: Negative.  Negative for chest pain, palpitations and leg swelling.  Gastrointestinal:  Negative for abdominal distention, abdominal pain and anal bleeding.  Genitourinary:  Negative for difficulty urinating, dysuria and enuresis.  Musculoskeletal: Negative.  Negative for arthralgias, back pain, neck pain and neck stiffness.       Right hand numbness.  Skin: Negative.   Neurological:  Negative for dizziness, facial asymmetry and headaches.  Psychiatric/Behavioral: Negative.  Negative for agitation, behavioral problems and confusion.       Objective:    Physical Exam Constitutional:      General: She is not in acute distress.    Appearance: Normal appearance. She is not ill-appearing, toxic-appearing or diaphoretic.  Cardiovascular:     Rate and Rhythm: Normal rate and regular rhythm.     Pulses: Normal pulses.     Heart sounds: Normal heart sounds. No murmur heard.    No friction rub. No gallop.  Pulmonary:     Effort: Pulmonary effort is normal. No respiratory distress.     Breath sounds: Normal breath sounds. No stridor. No wheezing, rhonchi or rales.  Chest:     Chest wall: No tenderness.  Abdominal:     Palpations: Abdomen is soft.     Tenderness: There is no abdominal tenderness.  Musculoskeletal:        General: No swelling, tenderness, deformity or signs of injury. Normal range of motion.     Right lower leg: Edema present.     Left lower leg: Edema present.     Comments: Paulita Cradle to ambulate with a cane , non pitting edema BLE  Skin:    General: Skin is warm and dry.     Capillary Refill: Capillary refill takes less than 2 seconds.  Coloration: Skin is not jaundiced or pale.     Findings: No bruising, erythema, lesion or rash.  Neurological:     Mental Status: She is alert and oriented to person, place, and time.     Cranial Nerves: No cranial nerve deficit.     Motor: No weakness.     Gait: Gait normal.  Psychiatric:         Mood and Affect: Mood normal.        Behavior: Behavior normal.        Thought Content: Thought content normal.     BP 127/77 (BP Location: Left Arm, Patient Position: Sitting, Cuff Size: Normal)   Pulse 76   Ht 5' (1.524 m)   Wt 131 lb (59.4 kg)   SpO2 96%   BMI 25.58 kg/m  Wt Readings from Last 3 Encounters:  09/17/21 131 lb (59.4 kg)  05/01/21 127 lb (57.6 kg)  04/20/21 128 lb (58.1 kg)    Lab Results  Component Value Date   TSH 0.866 05/04/2021   Lab Results  Component Value Date   WBC 6.8 05/04/2021   HGB 12.3 05/04/2021   HCT 37.2 05/04/2021   MCV 85 05/04/2021   PLT 358 05/04/2021   Lab Results  Component Value Date   NA 143 05/04/2021   K 4.9 05/04/2021   CO2 24 05/04/2021   GLUCOSE 90 05/04/2021   BUN 20 05/04/2021   CREATININE 1.05 (H) 05/04/2021   BILITOT 0.4 05/04/2021   ALKPHOS 62 05/04/2021   AST 19 05/04/2021   ALT 12 05/04/2021   PROT 6.7 05/04/2021   ALBUMIN 4.1 05/04/2021   CALCIUM 9.4 05/04/2021   ANIONGAP 8 07/03/2018   EGFR 49 (L) 05/04/2021   Lab Results  Component Value Date   CHOL 231 (H) 05/04/2021   Lab Results  Component Value Date   HDL 72 05/04/2021   Lab Results  Component Value Date   LDLCALC 135 (H) 05/04/2021   Lab Results  Component Value Date   TRIG 138 05/04/2021   Lab Results  Component Value Date   CHOLHDL 3.2 05/04/2021   Lab Results  Component Value Date   HGBA1C 6.2 (H) 05/04/2021      Assessment & Plan:   Problem List Items Addressed This Visit       Cardiovascular and Mediastinum   HYPERTENSION, BENIGN ESSENTIAL - Primary    BP Readings from Last 3 Encounters:  09/17/21 127/77  05/01/21 138/68  04/26/21 121/75  Chronic medical condition well-controlled on amlodipine 5 mg daily She has stopped taking Maxide Okay to continue with amlodipine only at this time since her blood pressure is well controlled, monitor blood pressure at home and call the office if blood pressure becomes greater  than 140/90 DASH diet advised engage in regular daily walking exercises as tolerated BMP today      Relevant Orders   Basic Metabolic Panel (BMET)     Digestive   GERD (gastroesophageal reflux disease)    Chronic medical condition well-controlled She has stopped taking GERD. Avoid  fried fatty foods, spicy foods         Nervous and Auditory   Carpal tunnel syndrome of right wrist    She has stopped taking gabapentin does not want to be taking a lot of medication Currently denies pain , wear hand brace as needed.        Other   HLD (hyperlipidemia)    Lab Results  Component Value Date  CHOL 231 (H) 05/04/2021   HDL 72 05/04/2021   LDLCALC 135 (H) 05/04/2021   TRIG 138 05/04/2021   CHOLHDL 3.2 05/04/2021  she is not on medication Avoid fatty fried foods.  Check labs at next visit        Bilateral leg edema    Patient told to keep her legs elevated when sitting, wear compression socks, avoid salty foods to help with swelling.        No orders of the defined types were placed in this encounter.   Follow-up: Return in about 4 months (around 01/17/2022) for HTN/HLD/Prediabetes .    Renee Rival, FNP

## 2021-09-17 NOTE — Assessment & Plan Note (Signed)
Lab Results  Component Value Date   CHOL 231 (H) 05/04/2021   HDL 72 05/04/2021   LDLCALC 135 (H) 05/04/2021   TRIG 138 05/04/2021   CHOLHDL 3.2 05/04/2021  she is not on medication Avoid fatty fried foods.  Check labs at next visit

## 2021-09-17 NOTE — Assessment & Plan Note (Addendum)
BP Readings from Last 3 Encounters:  09/17/21 127/77  05/01/21 138/68  04/26/21 121/75  Chronic medical condition well-controlled on amlodipine 5 mg daily She has stopped taking Maxide Okay to continue with amlodipine only at this time since her blood pressure is well controlled, monitor blood pressure at home and call the office if blood pressure becomes greater than 140/90 DASH diet advised engage in regular daily walking exercises as tolerated BMP today

## 2021-09-18 LAB — BASIC METABOLIC PANEL
BUN/Creatinine Ratio: 20 (ref 12–28)
BUN: 16 mg/dL (ref 10–36)
CO2: 23 mmol/L (ref 20–29)
Calcium: 9 mg/dL (ref 8.7–10.3)
Chloride: 106 mmol/L (ref 96–106)
Creatinine, Ser: 0.79 mg/dL (ref 0.57–1.00)
Glucose: 92 mg/dL (ref 70–99)
Potassium: 4.5 mmol/L (ref 3.5–5.2)
Sodium: 143 mmol/L (ref 134–144)
eGFR: 69 mL/min/{1.73_m2} (ref >59)

## 2021-09-18 NOTE — Progress Notes (Signed)
Normal kidney function continue current medications

## 2021-09-22 ENCOUNTER — Encounter: Payer: Self-pay | Admitting: Internal Medicine

## 2021-09-22 NOTE — Progress Notes (Signed)
Mailed normal results to patient

## 2021-11-30 ENCOUNTER — Other Ambulatory Visit: Payer: Self-pay

## 2021-11-30 MED ORDER — ALENDRONATE SODIUM 70 MG PO TABS: 70.0000 mg | ORAL_TABLET | ORAL | 0 refills | Status: DC

## 2021-12-02 ENCOUNTER — Ambulatory Visit: Payer: Medicare Other | Admitting: Internal Medicine

## 2021-12-25 ENCOUNTER — Encounter: Payer: Self-pay | Admitting: Internal Medicine

## 2021-12-25 ENCOUNTER — Ambulatory Visit: Payer: Medicare Other | Admitting: Internal Medicine

## 2021-12-25 ENCOUNTER — Ambulatory Visit (INDEPENDENT_AMBULATORY_CARE_PROVIDER_SITE_OTHER): Payer: Medicare Other | Admitting: Internal Medicine

## 2021-12-25 VITALS — BP 146/66 | HR 93 | Ht 60.0 in | Wt 129.8 lb

## 2021-12-25 DIAGNOSIS — M810 Age-related osteoporosis without current pathological fracture: Secondary | ICD-10-CM

## 2021-12-25 DIAGNOSIS — K219 Gastro-esophageal reflux disease without esophagitis: Secondary | ICD-10-CM

## 2021-12-25 DIAGNOSIS — E782 Mixed hyperlipidemia: Secondary | ICD-10-CM | POA: Diagnosis not present

## 2021-12-25 DIAGNOSIS — R7303 Prediabetes: Secondary | ICD-10-CM | POA: Diagnosis not present

## 2021-12-25 DIAGNOSIS — R5382 Chronic fatigue, unspecified: Secondary | ICD-10-CM | POA: Diagnosis not present

## 2021-12-25 DIAGNOSIS — I10 Essential (primary) hypertension: Secondary | ICD-10-CM | POA: Diagnosis not present

## 2021-12-25 DIAGNOSIS — G5601 Carpal tunnel syndrome, right upper limb: Secondary | ICD-10-CM | POA: Diagnosis not present

## 2021-12-25 MED ORDER — GABAPENTIN 100 MG PO CAPS
100.0000 mg | ORAL_CAPSULE | Freq: Every day | ORAL | 2 refills | Status: DC
Start: 2021-12-25 — End: 2022-03-19

## 2021-12-25 NOTE — Assessment & Plan Note (Signed)
Previously prescribed amlodipine and Maxide for treatment of hypertension.  Her blood pressure today is 146/66.  She is currently just taking amlodipine 5 mg daily.  -No medication changes today given recent symptoms of dizziness.  Her blood pressure is acceptable.  Continue amlodipine 5 mg daily.

## 2021-12-25 NOTE — Assessment & Plan Note (Signed)
Previously prescribed Protonix and Carafate for treatment of GERD.  She is not taking either of these medications currently and is asymptomatic.  No changes today.

## 2021-12-25 NOTE — Progress Notes (Signed)
Established Patient Office Visit  Subjective   Patient ID: Sandra Williams, female    DOB: [birth year]  Age: 88 y.o. MRN: [id number]  Chief Complaint  Patient presents with   Dizziness    When waking up notices she has dizziness, slight headaches, right hand numbness   Fatigue    More than usual   Sandra Williams returns to care today.  She was last seen at [facility] on 9/14 for routine follow-up.  There have been no acute interval events.  Today she states that she feels well overall.  Her acute concern is numbness in her right hand.  She has previously been diagnosed with carpal tunnel syndrome of the right wrist.  She has not been taking gabapentin while wearing her wrist brace as previously prescribed.  She is accompanied by her daughter today, he reports that her mother has been feeling down recently due to the sudden loss of family member.  Sandra Williams additionally endorses occasional dizziness when waking up.  This seems to quickly resolved.  She denies dizziness currently.  Past Medical History:  Diagnosis Date   Arthritis    ARTHRITIS 10/07/2006   Qualifier: Diagnosis of  By: Jonna Munro MD, Cornelius     Cancer [facility])    cecal carcinoma   Chest pain 10/22/2012   Colon cancer ([place]) 02/01/2012   Stage I (T2, N0, M0) cancer the cecum status post resection by Dr. Arnoldo Morale with 14 lymph nodes found all of which were negative. She had no evidence for metastatic disease on CT scan of the abdomen and pelvis either. Her date of surgery was 10/22/2011.  Not in need of chemotherapy for her stage I cancer.    GERD (gastroesophageal reflux disease)    HTN (hypertension)    Hyperlipidemia    Iron deficiency anemia 02/01/2012   At time of colon cancer presentation   Knee joint pain 2014   right   Lipoma of right forearm s/p excision 07/06/2018    OA (osteoarthritis) of knee 06/27/2012   Osteoporosis    Rotator cuff tear arthropathy 12/11/2013   Small bowel obstruction ([place]) 09/14/2017   Past Surgical  History:  Procedure Laterality Date   ABDOMINAL HYSTERECTOMY     partial   APPENDECTOMY     APH   CATARACT EXTRACTION Bilateral    CESAREAN SECTION     COLON SURGERY     dental implant     LIPOMA EXCISION Right 07/06/2018   Procedure: EXCISION LIPOMA RIGHT FOREARM;  Surgeon: Carole Civil, MD;  Location: [facility];  Service: Orthopedics;  Laterality: Right;   PARTIAL COLECTOMY  10/22/2011   Procedure: PARTIAL COLECTOMY;  Surgeon: Jamesetta So, MD;  Location: [facility];  Service: General;  Laterality: N/A;   TOTAL KNEE ARTHROPLASTY Right 01/30/2014   Procedure: TOTAL KNEE ARTHROPLASTY;  Surgeon: Carole Civil, MD;  Location: [facility];  Service: Orthopedics;  Laterality: Right;   Social History   Tobacco Use   Smoking status: Never   Smokeless tobacco: Never  Vaping Use   Vaping Use: Never used  Substance Use Topics   Alcohol use: No   Drug use: No   Family History  Problem Relation Age of Onset   Hypertension Mother    Cancer Sister    Breast cancer Daughter    Hypertension Daughter    Colon cancer Neg Hx    Liver disease Neg Hx    Allergies  Allergen Reactions   Aspirin     REACTION: Upset  stomach if takes regular and uncoated if takes high dose      Review of Systems  Constitutional:  Positive for malaise/fatigue.  Neurological:  Positive for tingling (Right hand).     Objective:     BP (!) 146/66   Pulse 93   Ht 5' (1.524 m)   Wt 129 lb 12.8 oz (58.9 kg)   SpO2 98%   BMI 25.35 kg/m  BP Readings from Last 3 Encounters:  12/25/21 (!) 146/66  09/17/21 127/77  05/01/21 138/68      Physical Exam Vitals reviewed.  Constitutional:      General: She is not in acute distress.    Appearance: Normal appearance. She is not toxic-appearing.  HENT:     Head: Normocephalic and atraumatic.     Right Ear: External ear normal.     Left Ear: External ear normal.     Nose: Nose normal. No congestion or rhinorrhea.     Mouth/Throat:     Mouth: Mucous  membranes are moist.     Pharynx: Oropharynx is clear. No oropharyngeal exudate or posterior oropharyngeal erythema.  Eyes:     General: No scleral icterus.    Extraocular Movements: Extraocular movements intact.     Conjunctiva/sclera: Conjunctivae normal.     Pupils: Pupils are equal, round, and reactive to light.  Cardiovascular:     Rate and Rhythm: Normal rate and regular rhythm.     Pulses: Normal pulses.     Heart sounds: Normal heart sounds. No murmur heard.    No friction rub. No gallop.  Pulmonary:     Effort: Pulmonary effort is normal.     Breath sounds: Normal breath sounds. No wheezing, rhonchi or rales.  Abdominal:     General: Abdomen is flat. Bowel sounds are normal. There is no distension.     Palpations: Abdomen is soft.     Tenderness: There is no abdominal tenderness.  Musculoskeletal:        General: No swelling. Normal range of motion.     Cervical back: Normal range of motion.     Right lower leg: No edema.     Left lower leg: No edema.  Lymphadenopathy:     Cervical: No cervical adenopathy.  Skin:    General: Skin is warm and dry.     Capillary Refill: Capillary refill takes less than 2 seconds.     Coloration: Skin is not jaundiced.  Neurological:     General: No focal deficit present.     Mental Status: She is alert and oriented to person, place, and time.     Gait: Gait abnormal (Ambulates with cane).     Comments: Negative Phalen's/Tinel's  Psychiatric:        Mood and Affect: Mood normal.        Behavior: Behavior normal.    Last CBC Lab Results  Component Value Date   WBC 6.8 05/04/2021   HGB 12.3 05/04/2021   HCT 37.2 05/04/2021   MCV 85 05/04/2021   MCH 28.1 05/04/2021   RDW 13.7 05/04/2021   PLT 358 01/77/9390   Last metabolic panel Lab Results  Component Value Date   GLUCOSE 92 09/17/2021   NA 143 09/17/2021   K 4.5 09/17/2021   CL 106 09/17/2021   CO2 23 09/17/2021   BUN 16 09/17/2021   CREATININE 0.79 09/17/2021   EGFR  69 09/17/2021   CALCIUM 9.0 09/17/2021   PHOS 3.1 10/23/2011   PROT 6.7 05/04/2021  ALBUMIN 4.1 05/04/2021   LABGLOB 2.6 05/04/2021   AGRATIO 1.6 05/04/2021   BILITOT 0.4 05/04/2021   ALKPHOS 62 05/04/2021   AST 19 05/04/2021   ALT 12 05/04/2021   ANIONGAP 8 07/03/2018   Last lipids Lab Results  Component Value Date   CHOL 231 (H) 05/04/2021   HDL 72 05/04/2021   LDLCALC 135 (H) 05/04/2021   TRIG 138 05/04/2021   CHOLHDL 3.2 05/04/2021   Last hemoglobin A1c Lab Results  Component Value Date   HGBA1C 6.2 (H) 05/04/2021   Last thyroid functions Lab Results  Component Value Date   TSH 0.866 05/04/2021   Last vitamin D Lab Results  Component Value Date   VD25OH 21.6 (L) 06/04/2020   Last vitamin B12 and Folate Lab Results  Component Value Date   VITAMINB12 353 07/17/2014   FOLATE 28.0 07/17/2014     Assessment & Plan:   Problem List Items Addressed This Visit       HYPERTENSION, BENIGN ESSENTIAL - Primary    Previously prescribed amlodipine and Maxide for treatment of hypertension.  Her blood pressure today is 146/66.  She is currently just taking amlodipine 5 mg daily.  -No medication changes today given recent symptoms of dizziness.  Her blood pressure is acceptable.  Continue amlodipine 5 mg daily.      GERD (gastroesophageal reflux disease)    Previously prescribed Protonix and Carafate for treatment of GERD.  She is not taking either of these medications currently and is asymptomatic.  No changes today.      Carpal tunnel syndrome of right wrist    She continues to endorse numbness of the right hand/wrist.  She has not been wearing her wrist brace recently and is not taking gabapentin.   -I have refilled gabapentin today and recommended that she resume wearing her wrist brace      Osteoporosis of multiple sites    Previously prescribed Fosamax for treatment of osteoporosis.  She stopped taking this medication recently because she was unable to undergo  a dental procedure while on bisphosphonates.  Of note, her vitamin D level was 21 when last checked in June 2022. -Discontinue Fosamax -Start daily vitamin D supplementation -We will repeat her vitamin D level prior to follow-up in 6 weeks      Return in about 6 weeks (around 02/05/2022).    Johnette Abraham, MD

## 2021-12-25 NOTE — Assessment & Plan Note (Signed)
She continues to endorse numbness of the right hand/wrist.  She has not been wearing her wrist brace recently and is not taking gabapentin.   -I have refilled gabapentin today and recommended that she resume wearing her wrist brace

## 2021-12-25 NOTE — Assessment & Plan Note (Signed)
Previously prescribed Fosamax for treatment of osteoporosis.  She stopped taking this medication recently because she was unable to undergo a dental procedure while on bisphosphonates.  Of note, her vitamin D level was 21 when last checked in June 2022. -Discontinue Fosamax -Start daily vitamin D supplementation -We will repeat her vitamin D level prior to follow-up in 6 weeks

## 2021-12-25 NOTE — Patient Instructions (Signed)
It was a pleasure to see you today.  Thank you for giving [place] the opportunity to be involved in your care.  Below is a brief recap of your visit and next steps.  We will plan to see you again in 6 weeks.  Summary Start vitamin D supplementation, 2000 international units daily I have refilled gabapentin to help with numbness in your right hand I recommend wearing your wrist brace to help with numbness in your right hand We will follow up in 6 weeks  Please have labwork completed prior to your appointment

## 2021-12-31 ENCOUNTER — Ambulatory Visit (INDEPENDENT_AMBULATORY_CARE_PROVIDER_SITE_OTHER): Payer: Medicare Other | Admitting: Family Medicine

## 2021-12-31 ENCOUNTER — Encounter: Payer: Self-pay | Admitting: Family Medicine

## 2021-12-31 ENCOUNTER — Ambulatory Visit (HOSPITAL_COMMUNITY)
Admission: RE | Admit: 2021-12-31 | Discharge: 2021-12-31 | Disposition: A | Discharge status: Home / Self Care | Payer: Medicare Other | Source: Ambulatory Visit | Attending: Family Medicine | Admitting: Family Medicine

## 2021-12-31 VITALS — BP 156/79 | HR 107 | Ht 60.0 in | Wt 129.1 lb

## 2021-12-31 DIAGNOSIS — R0602 Shortness of breath: Secondary | ICD-10-CM | POA: Diagnosis not present

## 2021-12-31 DIAGNOSIS — M1990 Unspecified osteoarthritis, unspecified site: Secondary | ICD-10-CM | POA: Diagnosis not present

## 2021-12-31 DIAGNOSIS — I10 Essential (primary) hypertension: Secondary | ICD-10-CM | POA: Diagnosis not present

## 2021-12-31 DIAGNOSIS — J40 Bronchitis, not specified as acute or chronic: Secondary | ICD-10-CM

## 2021-12-31 DIAGNOSIS — R059 Cough, unspecified: Secondary | ICD-10-CM | POA: Diagnosis not present

## 2021-12-31 DIAGNOSIS — R531 Weakness: Secondary | ICD-10-CM | POA: Diagnosis not present

## 2021-12-31 MED ORDER — AZITHROMYCIN 250 MG PO TABS: ORAL_TABLET | ORAL | 0 refills | Status: AC

## 2021-12-31 MED ORDER — BENZONATATE 100 MG PO CAPS: 100.0000 mg | ORAL_CAPSULE | Freq: Two times a day (BID) | ORAL | 0 refills | Status: DC | PRN

## 2021-12-31 NOTE — Progress Notes (Signed)
   Sandra Williams     MRN: [id number]      DOB: 11-04-1925   HPI Sandra Williams is here with a 2 week h/o cough, sputum at times is thick and cream, reports increased weakness Denies sinus pressure, sore throat or increased post nasal drainage    ROS Denies recent fever or chills.c/o weakness Denies  leg swelling Denies abdominal pain, nausea, vomiting,diarrhea or constipation.   Denies dysuria, frequency, or malodorous urine  Denies depression, anxiety or insomnia. Denies skin break down or rash.   PE  BP (!) 156/79 (BP Location: Left Arm, Patient Position: Sitting, Cuff Size: Normal)   Pulse (!) 107   Ht 5' (1.524 m)   Wt 129 lb 1.3 oz (58.6 kg)   SpO2 94%   BMI 25.21 kg/m   Patient alert and oriented and in no cardiopulmonary distress.  HEENT: No facial asymmetry, EOMI,     Neck supple .  Chest: decreased air entry, scattered bibasilar crackles , no wheezes CVS: S1, S2 ABD: Soft non tender.   Ext: No edema  MS: decreased  ROM spine, shoulders, hips and knees.  Skin: Intact, no ulcerations or rash noted.  Psych:  not anxious or depressed appearing.  CNS: CN 2-12 intact Assessment & Plan  Bronchitis 2 week h/o worsening cough and chest congestion with fatigue Z pack, tessalon perles, cXR and cBc and diff and chem7 and EGFR Close f/u with pCP  HYPERTENSION, BENIGN ESSENTIAL Not at gsoal, however medication management per PCP which she already has close f/u with  Osteoarthritis No falls reported, home safety reviewed briefly

## 2021-12-31 NOTE — Assessment & Plan Note (Signed)
2 week h/o worsening cough and chest congestion with fatigue Z pack, tessalon perles, cXR and cBc and diff and chem7 and EGFR Close f/u with pCP

## 2021-12-31 NOTE — Patient Instructions (Addendum)
F/U with dr [name] as before, call to be seen sooner if needed  You are treated for acute bronchitis, Azithromycin and tessalon perles have been prescribed,please start medication TODAY  CBC and dif, chem 7 and eGFR today  CXR today ( go to radiology deept at the hospital)  You will be contacted with results tomorrow if available ( should be)   Blood pressure is still above goal, but will not make any changes to your  medications, Dr [name] will manage at next visit  I hope that you feel better soon , remember that rest is a part of healing!  Thanks for choosing [facility], we consider it a privelige to serve you.

## 2022-01-01 LAB — CBC WITH DIFFERENTIAL/PLATELET
Basophils Absolute: 0 10*3/uL (ref 0.0–0.2)
Basos: 1 %
EOS (ABSOLUTE): 0 10*3/uL (ref 0.0–0.4)
Eos: 1 %
Hematocrit: 38.5 % (ref 34.0–46.6)
Hemoglobin: 12.5 g/dL (ref 11.1–15.9)
Immature Grans (Abs): 0 10*3/uL (ref 0.0–0.1)
Immature Granulocytes: 0 %
Lymphocytes Absolute: 0.8 10*3/uL (ref 0.7–3.1)
Lymphs: 25 %
MCH: 27.8 pg (ref 26.6–33.0)
MCHC: 32.5 g/dL (ref 31.5–35.7)
MCV: 86 fL (ref 79–97)
Monocytes Absolute: 0.6 10*3/uL (ref 0.1–0.9)
Monocytes: 19 %
Neutrophils Absolute: 1.8 10*3/uL (ref 1.4–7.0)
Neutrophils: 54 %
Platelets: 222 10*3/uL (ref 150–450)
RBC: 4.5 x10E6/uL (ref 3.77–5.28)
RDW: 14.3 % (ref 11.7–15.4)
WBC: 3.3 10*3/uL — ABNORMAL LOW (ref 3.4–10.8)

## 2022-01-01 LAB — BMP8+EGFR
BUN/Creatinine Ratio: 16 (ref 12–28)
BUN: 13 mg/dL (ref 10–36)
CO2: 24 mmol/L (ref 20–29)
Calcium: 9 mg/dL (ref 8.7–10.3)
Chloride: 100 mmol/L (ref 96–106)
Creatinine, Ser: 0.8 mg/dL (ref 0.57–1.00)
Glucose: 99 mg/dL (ref 70–99)
Potassium: 4.1 mmol/L (ref 3.5–5.2)
Sodium: 137 mmol/L (ref 134–144)
eGFR: 68 mL/min/{1.73_m2} (ref >59)

## 2022-01-03 ENCOUNTER — Encounter: Payer: Self-pay | Admitting: Family Medicine

## 2022-01-03 NOTE — Assessment & Plan Note (Signed)
Not at gsoal, however medication management per PCP which she already has close f/u with

## 2022-01-03 NOTE — Assessment & Plan Note (Signed)
No falls reported, home safety reviewed briefly

## 2022-01-21 ENCOUNTER — Ambulatory Visit: Payer: Medicare Other | Admitting: Internal Medicine

## 2022-01-28 ENCOUNTER — Ambulatory Visit (INDEPENDENT_AMBULATORY_CARE_PROVIDER_SITE_OTHER): Payer: Medicare Other

## 2022-01-28 VITALS — BP 143/76 | HR 94 | Ht 60.0 in | Wt 128.0 lb

## 2022-01-28 DIAGNOSIS — Z0000 Encounter for general adult medical examination without abnormal findings: Secondary | ICD-10-CM | POA: Diagnosis not present

## 2022-01-28 NOTE — Progress Notes (Signed)
Subjective:   Sandra Williams is a 89 y.o. female who presents for Medicare Annual (Subsequent) preventive examination.  Review of Systems     Objective:    Today's Vitals   01/28/22 1117 01/28/22 1122  BP: (!) 143/76   Pulse: 94   SpO2: 96%   Weight: 128 lb (58.1 kg)   Height: 5' (1.524 m)   PainSc:  8    Body mass index is 25 kg/m.     01/28/2022   11:36 AM 09/27/2020    9:05 AM 07/06/2018    8:23 AM 07/03/2018    1:39 PM 09/14/2017    5:01 PM 09/14/2017   10:36 AM 07/21/2015   10:16 AM  Advanced Directives  Does Patient Have a Medical Advance Directive? Yes No Yes Yes No No Yes  Type of Materials engineer of [place];Living will   [place];Living will  Does patient want to make changes to medical advance directive?    No - Patient declined   No - Patient declined  Copy of [place] in Chart? No - copy requested   No - copy requested   No - copy requested  Would patient like information on creating a medical advance directive?  Yes (MAU/Ambulatory/Procedural Areas - Information given)   No - Patient declined No - Patient declined     Current Medications (verified) Outpatient Encounter Medications as of 01/28/2022  Medication Sig   acetaminophen (TYLENOL) 500 MG tablet Take 500 mg by mouth every 6 (six) hours as needed.   amLODipine (NORVASC) 5 MG tablet Take 1 tablet (5 mg total) by mouth daily.   Ascorbic Acid (VITAMIN C ADULT GUMMIES PO) Take by mouth.   aspirin 81 MG EC tablet Take 81 mg by mouth daily.    benzonatate (TESSALON) 100 MG capsule Take 1 capsule (100 mg total) by mouth 2 (two) times daily as needed for cough.   gabapentin (NEURONTIN) 100 MG capsule Take 1 capsule (100 mg total) by mouth daily.   No facility-administered encounter medications on file as of 01/28/2022.    Allergies (verified) Aspirin   History: Past Medical History:   Diagnosis Date   Arthritis    ARTHRITIS 10/07/2006   Qualifier: Diagnosis of  By: Jonna Munro MD, Cornelius     Cancer [facility])    cecal carcinoma   Chest pain 10/22/2012   Colon cancer ([place]) 02/01/2012   Stage I (T2, N0, M0) cancer the cecum status post resection by Dr. Arnoldo Morale with 14 lymph nodes found all of which were negative. She had no evidence for metastatic disease on CT scan of the abdomen and pelvis either. Her date of surgery was 10/22/2011.  Not in need of chemotherapy for her stage I cancer.    GERD (gastroesophageal reflux disease)    HTN (hypertension)    Hyperlipidemia    Iron deficiency anemia 02/01/2012   At time of colon cancer presentation   Knee joint pain 2014   right   Lipoma of right forearm s/p excision 07/06/2018    OA (osteoarthritis) of knee 06/27/2012   Osteoporosis    Rotator cuff tear arthropathy 12/11/2013   Small bowel obstruction ([place]) 09/14/2017   Past Surgical History:  Procedure Laterality Date   ABDOMINAL HYSTERECTOMY     partial   APPENDECTOMY     [facility]   CATARACT EXTRACTION Bilateral    CESAREAN SECTION     COLON SURGERY  dental implant     LIPOMA EXCISION Right 07/06/2018   Procedure: EXCISION LIPOMA RIGHT FOREARM;  Surgeon: Carole Civil, MD;  Location: [facility];  Service: Orthopedics;  Laterality: Right;   PARTIAL COLECTOMY  10/22/2011   Procedure: PARTIAL COLECTOMY;  Surgeon: Jamesetta So, MD;  Location: [facility];  Service: General;  Laterality: N/A;   TOTAL KNEE ARTHROPLASTY Right 01/30/2014   Procedure: TOTAL KNEE ARTHROPLASTY;  Surgeon: Carole Civil, MD;  Location: [facility];  Service: Orthopedics;  Laterality: Right;   Family History  Problem Relation Age of Onset   Hypertension Mother    Cancer Sister    Breast cancer Daughter    Hypertension Daughter    Colon cancer Neg Hx    Liver disease Neg Hx    Social History   Socioeconomic History   Marital status: Single    Spouse name: Not on file   Number of children: 3    Years of education: Not on file   Highest education level: Not on file  Occupational History   Not on file  Tobacco Use   Smoking status: Never   Smokeless tobacco: Never  Vaping Use   Vaping Use: Never used  Substance and Sexual Activity   Alcohol use: No   Drug use: No   Sexual activity: Never    Birth control/protection: Surgical  Other Topics Concern   Not on file  Social History Narrative   Lives with daughter in [place], [place]: sits on the porch- people watching, use to like bingo, walks around house a lot      Diet: eats all food groups   Caffeine: coffee- 3-4 days a week, some tea, drinks coke 3 times a week    Water:  4-5 cups roughly      Wears seat belt   No longer drives   Smoke detectors at home   No weapons she knows of    Social Determinants of Health   Financial Resource Strain: Medium Risk (01/28/2022)   Overall Financial Resource Strain (CARDIA)    Difficulty of Paying Living Expenses: Somewhat hard  Food Insecurity: Food Insecurity Present (01/28/2022)   Hunger Vital Sign    Worried About Running Out of Food in the Last Year: Sometimes true    Ran Out of Food in the Last Year: Sometimes true  Transportation Needs: Unmet Transportation Needs (01/28/2022)   PRAPARE - Transportation    Lack of Transportation (Medical): Yes    Lack of Transportation (Non-Medical): Yes  Physical Activity: Sufficiently Active (01/28/2022)   Exercise Vital Sign    Days of Exercise per Week: 7 days    Minutes of Exercise per Session: 30 min  Stress: Stress Concern Present (01/28/2022)   [place]    Feeling of Stress : To some extent  Social Connections: Socially Isolated (01/28/2022)   Social Connection and Isolation Panel [NHANES]    Frequency of Communication with Friends and Family: More than three times a week    Frequency of Social Gatherings with Friends and Family: More than three times a  week    Attends Religious Services: Never    Marine scientist or Organizations: No    Attends Archivist Meetings: Never    Marital Status: Never married    Tobacco Counseling Counseling given: Not Answered   Clinical Intake:   Diabetic? No   Activities of Daily Living  01/28/2022   11:37 AM  In your present state of health, do you have any difficulty performing the following activities:  Hearing? 0  Vision? 1  Difficulty concentrating or making decisions? 0  Walking or climbing stairs? 1  Dressing or bathing? 1  Doing errands, shopping? 1  Preparing Food and eating ? Y  Using the Toilet? N  In the past six months, have you accidently leaked urine? Y  Do you have problems with loss of bowel control? N  Managing your Medications? Y  Managing your Finances? N  Housekeeping or managing your Housekeeping? Y    Patient Care Team: Johnette Abraham, MD as PCP - General (Internal Medicine)  Indicate any recent Medical Services you may have received from other than [facility] providers in the past year (date may be approximate).     Assessment:   This is a routine wellness examination for [place].  Hearing/Vision screen No results found.  Dietary issues and exercise activities discussed:     Goals Addressed   None   Depression Screen    01/28/2022   11:33 AM 12/31/2021    2:57 PM 12/25/2021   11:02 AM 09/17/2021    3:59 PM 05/01/2021    3:58 PM 09/27/2020    9:06 AM 09/27/2020    9:02 AM  PHQ 2/9 Scores  PHQ - 2 Score 0 2 3 0 0 0 1  PHQ- 9 Score '9 12 8   '$ 0 1    Fall Risk    01/28/2022   11:37 AM 12/31/2021    2:57 PM 12/25/2021   11:02 AM 09/17/2021    3:58 PM 05/01/2021    3:58 PM  Fall Risk   Falls in the past year? 0 0 0 0 0  Number falls in past yr: 0 0 0 0 0  Injury with Fall? 0 0 0 0 0  Risk for fall due to : No Fall Risks No Fall Risks No Fall Risks No Fall Risks No Fall Risks  Follow up Falls evaluation completed Falls evaluation  completed Falls evaluation completed Falls evaluation completed Falls evaluation completed    [place]:  Any stairs in or around the home? No  If so, are there any without handrails?  na Home free of loose throw rugs in walkways, pet beds, electrical cords, etc? Yes  Adequate lighting in your home to reduce risk of falls? Yes   ASSISTIVE DEVICES UTILIZED TO PREVENT FALLS:  Life alert? No  Use of a cane, walker or w/c? Yes  Grab bars in the bathroom? No  Shower chair or bench in shower? No  Elevated toilet seat or a handicapped toilet? No   TIMED UP AND GO:  Was the test performed? Yes .  Length of time to ambulate 10 feet: 7 sec.   Gait steady and fast with assistive device  Cognitive Function:    09/27/2020    8:51 AM  MMSE - Mini Mental State Exam  Not completed: Unable to complete        01/28/2022   11:38 AM 09/27/2020    9:07 AM  6CIT Screen  What Year? 0 points 0 points  What month? 0 points 0 points  What time? 0 points 0 points  Count back from 20 0 points 0 points  Months in reverse 0 points 0 points  Repeat phrase 6 points 0 points  Total Score 6 points 0 points    Immunizations Immunization History  Administered Date(s) Administered   Fluad Quad(high Dose 65+) 11/06/2019   Influenza Whole 10/07/2006   Moderna SARS-COV2 Booster Vaccination 12/05/2019   Moderna Sars-Covid-2 Vaccination 03/03/2019, 03/31/2019   Pneumococcal Polysaccharide-23 10/07/2006   Zoster Recombinat (Shingrix) 09/20/2019    TDAP status: Up to date  Flu Vaccine status: Declined, Education has been provided regarding the importance of this vaccine but patient still declined. Advised may receive this vaccine at local pharmacy or Health Dept. Aware to provide a copy of the vaccination record if obtained from local pharmacy or Health Dept. Verbalized acceptance and understanding.  Pneumococcal vaccine status: Up to date  Covid-19 vaccine status:  Completed vaccines  Qualifies for Shingles Vaccine? Yes   Zostavax completed No   Shingrix Completed?: No.    Education has been provided regarding the importance of this vaccine. Patient has been advised to call insurance company to determine out of pocket expense if they have not yet received this vaccine. Advised may also receive vaccine at local pharmacy or Health Dept. Verbalized acceptance and understanding.  Screening Tests Health Maintenance  Topic Date Due   DTaP/Tdap/Td (1 - Tdap) Never done   Zoster Vaccines- Shingrix (2 of 2) 11/15/2019   COVID-19 Vaccine (4 - 2023-24 season) 09/04/2021   INFLUENZA VACCINE  04/04/2022 (Originally 08/04/2021)   Pneumonia Vaccine 71+ Years old (2 - PCV) 09/18/2022 (Originally 10/07/2007)   Medicare Annual Wellness (AWV)  01/29/2023   DEXA SCAN  Completed   HPV VACCINES  Aged Out    Health Maintenance  Health Maintenance Due  Topic Date Due   DTaP/Tdap/Td (1 - Tdap) Never done   Zoster Vaccines- Shingrix (2 of 2) 11/15/2019   COVID-19 Vaccine (4 - 2023-24 season) 09/04/2021    Colorectal cancer screening: No longer required.   Mammogram status: No longer required due to age.  Bone Density status: Completed 2008. Results reflect: Bone density results: OSTEOPOROSIS. Repeat every 0 years.  Lung Cancer Screening: (Low Dose CT Chest recommended if Age 28-80 years, 30 pack-year currently smoking OR have quit w/in 15years.) does not qualify.   Lung Cancer Screening Referral: NA  Additional Screening:  Hepatitis C Screening: does not qualify; Completed   Vision Screening: Recommended annual ophthalmology exams for early detection of glaucoma and other disorders of the eye. Is the patient up to date with their annual eye exam?  No  Who is the provider or what is the name of the office in which the patient attends annual eye exams? NA If pt is not established with a provider, would they like to be referred to a provider to establish care? No  .   Dental Screening: Recommended annual dental exams for proper oral hygiene  Community Resource Referral / Chronic Care Management: CRR required this visit?  No   CCM required this visit?  No      Plan:     I have personally reviewed and noted the following in the patient's chart:   Medical and social history Use of alcohol, tobacco or illicit drugs  Current medications and supplements including opioid prescriptions. Patient is not currently taking opioid prescriptions. Functional ability and status Nutritional status Physical activity Advanced directives List of other physicians Hospitalizations, surgeries, and ER visits in previous 12 months Vitals Screenings to include cognitive, depression, and falls Referrals and appointments  In addition, I have reviewed and discussed with patient certain preventive protocols, quality metrics, and best practice recommendations. A written personalized care plan for preventive services as well as general preventive  health recommendations were provided to patient.     Smitty Knudsen, CMA   01/28/2022

## 2022-01-28 NOTE — Patient Instructions (Signed)
  Sandra Williams , Thank you for taking time to come for your Medicare Wellness Visit. I appreciate your ongoing commitment to your health goals. Please review the following plan we discussed and let me know if I can assist you in the future.   These are the goals we discussed:  Goals      Patient Stated     Would like to walk more        This is a list of the screening recommended for you and due dates:  Health Maintenance  Topic Date Due   DTaP/Tdap/Td vaccine (1 - Tdap) Never done   Zoster (Shingles) Vaccine (2 of 2) 11/15/2019   COVID-19 Vaccine (4 - 2023-24 season) 09/04/2021   Flu Shot  04/04/2022*   Pneumonia Vaccine (2 - PCV) 09/18/2022*   Medicare Annual Wellness Visit  01/29/2023   DEXA scan (bone density measurement)  Completed   HPV Vaccine  Aged Out  *Topic was postponed. The date shown is not the original due date.

## 2022-02-01 ENCOUNTER — Telehealth: Payer: Self-pay | Admitting: *Deleted

## 2022-02-01 NOTE — Progress Notes (Unsigned)
  Care Coordination  Outreach Note  02/01/2022 Name: Loralye Loberg MRN: [id number] DOB: [birth year]   Care Coordination Outreach Attempts: An unsuccessful telephone outreach was attempted today to offer the patient information about available care coordination services as a benefit of their health plan.   Follow Up Plan:  Additional outreach attempts will be made to offer the patient care coordination information and services.   Encounter Outcome:  No Answer  [place]  Direct Dial: [phone]

## 2022-02-02 NOTE — Progress Notes (Signed)
  Care Coordination   Note   02/02/2022 Name: Sandra Williams MRN: [id number] DOB: 1925-03-04  Sandra Williams is a 89 y.o. year old female who sees [name], Hazle Nordmann, MD for primary care. I reached out to [name] by phone today to offer care coordination services.  Ms. Weddington was given information about Care Coordination services today including:   The Care Coordination services include support from the care team which includes your Nurse Coordinator, Clinical Social Worker, or Pharmacist.  The Care Coordination team is here to help remove barriers to the health concerns and goals most important to you. Care Coordination services are voluntary, and the patient may decline or stop services at any time by request to their care team member.   Care Coordination Consent Status: Patient agreed to services and verbal consent obtained.   Follow up plan:  Telephone appointment with care coordination team member scheduled for:  02/09/22  Encounter Outcome:  Pt. Scheduled  [place]  Direct Dial: [phone]

## 2022-02-03 ENCOUNTER — Telehealth: Payer: Self-pay | Admitting: *Deleted

## 2022-02-03 NOTE — Telephone Encounter (Signed)
   Telephone encounter was:  Unsuccessful.  02/03/2022 Name: Dynisha Due MRN: [id number] DOB: 29-Sep-1925  Unsuccessful outbound call made today to assist with:  Transportation Needs  and Food Insecurity  Outreach Attempt:  1st Attempt  A HIPAA compliant voice message was left requesting a return call.  Instructed patient to call back at [phone].  [place] [phone] [address] , [place] [address] Email : [email or web address]

## 2022-02-09 ENCOUNTER — Ambulatory Visit: Payer: Self-pay

## 2022-02-09 NOTE — Patient Outreach (Signed)
  Care Coordination   Initial Visit Note   02/09/2022 Name: Sandra Williams MRN: [id number] DOB: 08/17/1925  Sandra Williams is a 89 y.o. year old female who sees Sandra Williams, Sandra Nordmann, MD for primary care. I  spoke with patients daughter and caregiver Sandra Williams by phone.  What matters to the patients health and wellness today?  Obtain resources to assist with meal preparation and care in the home.    Goals Addressed             This Visit's Progress    Care Coordination Activities       Care Coordination Interventions: Discussed patient lives in the home with her sister whom is 65 and currently undergoing chemotherapy. Both the patient and her sister have difficulty with meal preparation and housekeeping. Patients daughter Sandra Williams checks on patient often to assist with transportation, grocery shopping, meals, etc.  Determined the patient has Medicare but does not have Medicaid Education provided on Medicaid expansion and Managed Medicaid plans - patient makes less than eligibility requirements under Medicaid expansion Mailed an application to the patients daughters home who will assist the patient with completion Education provided on the low income energy assistance program (LIEAP) to assist with gas bill if needed Education provided on Meals on Wheels program - Sandra Williams requests SW complete referral to [facility] in [place] message left for Sandra Williams [phone]) advising SW would like to place a new referral and will e-mail referral details Referral placed via secure e-mail to Sandra Williams ([email or web address]) referring both the patient and her sister to the program Discussed plan for SW to follow up over the next three weeks to confirm receipt of resources         SDOH assessments and interventions completed:  Yes  SDOH Interventions Today    Flowsheet Row Most Recent Value  SDOH Interventions   Food Insecurity Interventions Other (Comment)   [Referral to meals on wheels]  Housing Interventions Intervention Not Indicated  Transportation Interventions Intervention Not Indicated  [daughter assists with transportation needs]  Utilities Interventions Other (Comment)  [Educated on LIEAP]  Financial Strain Interventions Other (Comment)  [Provided Medicaid application]        Care Coordination Interventions:  Yes, provided   Interventions Today    Flowsheet Row Most Recent Value  Chronic Disease Discussed/Reviewed   Chronic disease discussed/reviewed during today's visit Hypertension (HTN)  General Interventions   General Interventions Discussed/Reviewed General Interventions Discussed, Radio producer Interventions   Education Provided Provided Engineer, site, Provided Verbal Education  Provided Verbal Education On Nutrition  [Community Resources]  Nutrition Interventions   Nutrition Discussed/Reviewed Nutrition Discussed        Follow up plan: Follow up call scheduled for 03/02/22    Encounter Outcome:  Pt. Visit Completed   Sandra Williams, Sandra Williams, CDP Social Worker, Certified Dementia Practitioner [place] [facility]  Care Coordination [phone]

## 2022-02-09 NOTE — Patient Instructions (Signed)
Visit Information  Thank you for taking time to visit with me today. Please don't hesitate to contact me if I can be of assistance to you.   Following are the goals we discussed today:   Goals Addressed             This Visit's Progress    Care Coordination Activities       Care Coordination Interventions: Discussed patient lives in the home with her sister whom is 60 and currently undergoing chemotherapy. Both the patient and her sister have difficulty with meal preparation and housekeeping. Patients daughter [name] checks on patient often to assist with transportation, grocery shopping, meals, etc.  Determined the patient has Medicare but does not have Medicaid Education provided on Medicaid expansion and Managed Medicaid plans - patient makes less than eligibility requirements under Medicaid expansion Mailed an application to the patients daughters home who will assist the patient with completion Education provided on the low income energy assistance program (LIEAP) to assist with gas bill if needed Education provided on Meals on Wheels program - [name] requests SW complete referral to [facility] in [place] message left for [name] [phone]) advising SW would like to place a new referral and will e-mail referral details Referral placed via secure e-mail to [name] ([email or web address]) referring both the patient and her sister to the program Discussed plan for SW to follow up over the next three weeks to confirm receipt of resources         Our next appointment is by telephone on 2/24 at 1:00 pm  Please call the care guide team at [phone] if you need to cancel or reschedule your appointment.   If you are experiencing a Mental Health or [place] or need someone to talk to, please call 911  Patient verbalizes understanding of instructions and care plan provided today and agrees to view in [place]. Active MyChart status  and patient understanding of how to access instructions and care plan via MyChart confirmed with patient.     Telephone follow up appointment with care management team member scheduled for:2/27  Daneen Schick, Arita Miss, CDP Social Worker, Certified Dementia Practitioner [facility]  Care Coordination [phone]

## 2022-02-10 ENCOUNTER — Telehealth: Payer: Self-pay | Admitting: *Deleted

## 2022-02-10 NOTE — Telephone Encounter (Signed)
   Telephone encounter was:  Unsuccessful.  02/10/2022 Name: Sandra Williams MRN: [id number] DOB: [birth year]  Unsuccessful outbound call made today to assist with:  Food Insecurity  Outreach Attempt:  2nd Attempt  A HIPAA compliant voice message was left requesting a return call.  Instructed patient to call back at [phone].  [place] [phone] [address] , [place] [address] Email : [email or web address]

## 2022-02-12 ENCOUNTER — Ambulatory Visit (INDEPENDENT_AMBULATORY_CARE_PROVIDER_SITE_OTHER): Payer: Medicare Other | Admitting: Internal Medicine

## 2022-02-12 ENCOUNTER — Encounter: Payer: Self-pay | Admitting: Internal Medicine

## 2022-02-12 VITALS — BP 155/76 | HR 81 | Ht 60.0 in | Wt 132.4 lb

## 2022-02-12 DIAGNOSIS — G5601 Carpal tunnel syndrome, right upper limb: Secondary | ICD-10-CM

## 2022-02-12 DIAGNOSIS — I10 Essential (primary) hypertension: Secondary | ICD-10-CM

## 2022-02-12 DIAGNOSIS — R42 Dizziness and giddiness: Secondary | ICD-10-CM

## 2022-02-12 MED ORDER — AMLODIPINE BESYLATE 10 MG PO TABS: 10.0000 mg | ORAL_TABLET | Freq: Every day | ORAL | 2 refills | Status: DC

## 2022-02-12 NOTE — Progress Notes (Unsigned)
   Acute Office Visit  Subjective:     Patient ID: Sandra Williams, female    DOB: [birth year], 89 y.o.   MRN: [id number]  No chief complaint on file.   HPI Patient is in today for ***  ROS      Objective:    There were no vitals taken for this visit. {Vitals History (Optional):23777}  Physical Exam  No results found for any visits on 02/12/22.      Assessment & Plan:   Problem List Items Addressed This Visit   None   No orders of the defined types were placed in this encounter.   No follow-ups on file.  Johnette Abraham, MD

## 2022-02-12 NOTE — Patient Instructions (Signed)
It was a pleasure to see you today.  Thank you for giving [place] the opportunity to be involved in your care.  Below is a brief recap of your visit and next steps.  We will plan to see you again in 1 month.  Summary Increase amlodipine to 10 mg daily I recommend wearing a wrist brace nightly and during the day

## 2022-02-15 ENCOUNTER — Telehealth: Payer: Self-pay | Admitting: *Deleted

## 2022-02-15 NOTE — Telephone Encounter (Signed)
   Telephone encounter was:  Unsuccessful.  02/15/2022 Name: Sallyanne Birkhead MRN: [id number] DOB: April 14, 1925  Unsuccessful outbound call made today to assist with:  Transportation Needs  and Food Insecurity  Outreach Attempt:  3rd Attempt.  Referral closed unable to contact patient.  A HIPAA compliant voice message was left requesting a return call.  Instructed patient to call back at [phone] [address] , [place] [address] Email : [email or web address]

## 2022-02-18 DIAGNOSIS — R42 Dizziness and giddiness: Secondary | ICD-10-CM | POA: Insufficient documentation

## 2022-02-18 NOTE — Assessment & Plan Note (Signed)
BP remains elevated today, 162/76 initially and 155/76 on repeat.  She is currently prescribed amlodipine 5 mg daily. -Increase amlodipine to 10 mg daily -Follow-up in 4 weeks for HTN check

## 2022-02-18 NOTE — Assessment & Plan Note (Signed)
Chronic issue.  Orthostatics negative.  Today she believes her symptoms are less severe in intensity.  She is not interested in making medication changes today.

## 2022-02-18 NOTE — Assessment & Plan Note (Signed)
Endorses numbness again today, which is improved when she wears her wrist brace. -I have recommended more consistent use of the wrist brace

## 2022-03-02 ENCOUNTER — Ambulatory Visit: Payer: Self-pay

## 2022-03-02 NOTE — Patient Outreach (Signed)
  Care Coordination   Follow Up Visit Note   03/02/2022 Name: Eureka Wuollet MRN: [id number] DOB: [birth year]  Sharryn Nakada is a 89 y.o. year old female who sees [name], Hazle Nordmann, MD for primary care. I  spoke with patients daughter [name] by phone today.  What matters to the patients health and wellness today?  Resource follow up    Goals Addressed             This Visit's Progress    COMPLETED: Care Coordination Activities       Care Coordination Interventions: Confirmed receipt of mailed resources, no assistance needed at this time Advised SW has yet to receive confirmation the patients referral to meals on wheels was accepted Placed referral to [facility] via [place] to contact SW as needed        SDOH assessments and interventions completed:  No     Care Coordination Interventions:  Yes, provided   Interventions Today    Flowsheet Row Most Recent Value  Chronic Disease   Chronic disease during today's visit Hypertension (HTN)  General Interventions   General Interventions Discussed/Reviewed General Interventions Reviewed  Nutrition Interventions   Nutrition Discussed/Reviewed Nutrition Discussed        Follow up plan: No further intervention required.   Encounter Outcome:  Pt. Visit Completed   Daneen Schick, BSW, CDP Social Worker, Certified Dementia Practitioner [facility]  Care Coordination [phone]

## 2022-03-02 NOTE — Patient Instructions (Signed)
Visit Information  Thank you for taking time to visit with me today. Please don't hesitate to contact me if I can be of assistance to you.   Following are the goals we discussed today:   Goals Addressed             This Visit's Progress    COMPLETED: Care Coordination Activities       Care Coordination Interventions: Confirmed receipt of mailed resources, no assistance needed at this time Advised SW has yet to receive confirmation the patients referral to meals on wheels was accepted Placed referral to [facility]TS via [place] to contact SW as needed        If you are experiencing a Mental Health or [place] or need someone to talk to, please call 911  Patient verbalizes understanding of instructions and care plan provided today and agrees to view in [place]. Active MyChart status and patient understanding of how to access instructions and care plan via MyChart confirmed with patient.     No further follow up required: Please contact your primary care provider as needed  Daneen Schick, Arita Miss, CDP Social Worker, Certified Dementia Practitioner [place] Coordination [phone]

## 2022-03-03 ENCOUNTER — Telehealth: Payer: Self-pay

## 2022-03-03 NOTE — Patient Outreach (Signed)
  Care Coordination   Community Resource Collaboration  Visit Note   03/03/2022 Name: Sandra Williams MRN: [id number] DOB: 27-Jul-1925  Sandra Williams is a 89 y.o. year old female who sees [name], Hazle Nordmann, MD for primary care. I  received a call from [facility] today confirming patient is on the wait list to receive meals on wheels.    SDOH assessments and interventions completed:  No     Care Coordination Interventions:  Yes, provided   Interventions Today    Flowsheet Row Most Recent Value  Chronic Disease   Chronic disease during today's visit Hypertension (HTN)  General Interventions   General Interventions Discussed/Reviewed Communication with  Communication with --  [ADTS]        Follow up plan: No further intervention required.   Encounter Outcome:  Pt. Visit Completed   Daneen Schick, BSW, CDP Social Worker, Certified Dementia Practitioner [facility]  Care Coordination [phone]

## 2022-03-04 ENCOUNTER — Encounter: Payer: Self-pay | Admitting: Radiology

## 2022-03-11 ENCOUNTER — Other Ambulatory Visit: Payer: Self-pay | Admitting: Nurse Practitioner

## 2022-03-11 DIAGNOSIS — I10 Essential (primary) hypertension: Secondary | ICD-10-CM

## 2022-03-12 ENCOUNTER — Ambulatory Visit: Payer: Medicare Other | Admitting: Internal Medicine

## 2022-03-12 ENCOUNTER — Encounter: Payer: Self-pay | Admitting: Internal Medicine

## 2022-03-19 ENCOUNTER — Ambulatory Visit (INDEPENDENT_AMBULATORY_CARE_PROVIDER_SITE_OTHER): Payer: Medicare Other | Admitting: Internal Medicine

## 2022-03-19 ENCOUNTER — Encounter: Payer: Self-pay | Admitting: Internal Medicine

## 2022-03-19 VITALS — BP 136/77 | HR 85 | Ht 60.0 in | Wt 130.2 lb

## 2022-03-19 DIAGNOSIS — G5601 Carpal tunnel syndrome, right upper limb: Secondary | ICD-10-CM | POA: Diagnosis not present

## 2022-03-19 DIAGNOSIS — I10 Essential (primary) hypertension: Secondary | ICD-10-CM | POA: Diagnosis not present

## 2022-03-19 MED ORDER — GABAPENTIN 100 MG PO CAPS: 100.0000 mg | ORAL_CAPSULE | Freq: Three times a day (TID) | ORAL | 2 refills | Status: DC

## 2022-03-19 NOTE — Progress Notes (Signed)
Established Patient Office Visit  Subjective   Patient ID: Sandra Williams, female    DOB: 1925-06-23  Age: 89 y.o. MRN: [id number]  Chief Complaint  Patient presents with   Hypertension    Follow up   Sandra Williams returns to care today for HTN follow-up.  She was last seen by me on 2/ At which time amlodipine was increased to 10 mg daily.  She endorses numbness in her right hand and I recommended more consistent use of her wrist brace.  There have been no acute interval events.  Sandra Williams reports feeling fairly well today.  She continues to endorse numbness in her right hand and has only been wearing her wrist brace at night.  She has no additional concerns to discuss today.  Past Medical History:  Diagnosis Date   Arthritis    ARTHRITIS 10/07/2006   Qualifier: Diagnosis of  By: Jonna Munro MD, Cornelius     Cancer [facility])    cecal carcinoma   Chest pain 10/22/2012   Colon cancer ([facility]) 02/01/2012   Stage I (T2, N0, M0) cancer the cecum status post resection by Dr. Arnoldo Morale with 14 lymph nodes found all of which were negative. She had no evidence for metastatic disease on CT scan of the abdomen and pelvis either. Her date of surgery was 10/22/2011.  Not in need of chemotherapy for her stage I cancer.    GERD (gastroesophageal reflux disease)    HTN (hypertension)    Hyperlipidemia    Iron deficiency anemia 02/01/2012   At time of colon cancer presentation   Knee joint pain 2014   right   Lipoma of right forearm s/p excision 07/06/2018    OA (osteoarthritis) of knee 06/27/2012   Osteoporosis    Rotator cuff tear arthropathy 12/11/2013   Small bowel obstruction ([facility]) 09/14/2017   Past Surgical History:  Procedure Laterality Date   ABDOMINAL HYSTERECTOMY     partial   APPENDECTOMY     [facility]   CATARACT EXTRACTION Bilateral    CESAREAN SECTION     COLON SURGERY     dental implant     LIPOMA EXCISION Right 07/06/2018   Procedure: EXCISION LIPOMA RIGHT FOREARM;  Surgeon: Carole Civil, MD;  Location: [facility];  Service: Orthopedics;  Laterality: Right;   PARTIAL COLECTOMY  10/22/2011   Procedure: PARTIAL COLECTOMY;  Surgeon: Jamesetta So, MD;  Location: [facility];  Service: General;  Laterality: N/A;   TOTAL KNEE ARTHROPLASTY Right 01/30/2014   Procedure: TOTAL KNEE ARTHROPLASTY;  Surgeon: Carole Civil, MD;  Location: [facility];  Service: Orthopedics;  Laterality: Right;   Social History   Tobacco Use   Smoking status: Never   Smokeless tobacco: Never  Vaping Use   Vaping Use: Never used  Substance Use Topics   Alcohol use: No   Drug use: No   Family History  Problem Relation Age of Onset   Hypertension Mother    Cancer Sister    Breast cancer Daughter    Hypertension Daughter    Colon cancer Neg Hx    Liver disease Neg Hx    Allergies  Allergen Reactions   Aspirin     REACTION: Upset stomach if takes regular and uncoated if takes high dose   Review of Systems  Neurological:  Positive for tingling (Right hand).  All other systems reviewed and are negative.    Objective:     BP 136/77   Pulse 85   Ht 5' (  1.524 m)   Wt 130 lb 3.2 oz (59.1 kg)   SpO2 97%   BMI 25.43 kg/m  BP Readings from Last 3 Encounters:  03/19/22 136/77  02/12/22 (!) 155/76  01/28/22 (!) 143/76   Physical Exam Vitals reviewed.  Constitutional:      General: She is not in acute distress.    Appearance: Normal appearance. She is not toxic-appearing.  HENT:     Head: Normocephalic and atraumatic.     Right Ear: External ear normal.     Left Ear: External ear normal.     Nose: Nose normal. No congestion or rhinorrhea.     Mouth/Throat:     Mouth: Mucous membranes are moist.     Pharynx: Oropharynx is clear. No oropharyngeal exudate or posterior oropharyngeal erythema.  Eyes:     General: No scleral icterus.    Extraocular Movements: Extraocular movements intact.     Conjunctiva/sclera: Conjunctivae normal.     Pupils: Pupils are equal, round, and reactive  to light.  Cardiovascular:     Rate and Rhythm: Normal rate and regular rhythm.     Pulses: Normal pulses.     Heart sounds: Normal heart sounds. No murmur heard.    No friction rub. No gallop.  Pulmonary:     Effort: Pulmonary effort is normal.     Breath sounds: Normal breath sounds. No wheezing, rhonchi or rales.  Abdominal:     General: Abdomen is flat. Bowel sounds are normal. There is no distension.     Palpations: Abdomen is soft.     Tenderness: There is no abdominal tenderness.  Musculoskeletal:        General: No swelling. Normal range of motion.     Cervical back: Normal range of motion.     Right lower leg: No edema.     Left lower leg: No edema.  Lymphadenopathy:     Cervical: No cervical adenopathy.  Skin:    General: Skin is warm and dry.     Capillary Refill: Capillary refill takes less than 2 seconds.     Coloration: Skin is not jaundiced.  Neurological:     General: No focal deficit present.     Mental Status: She is alert and oriented to person, place, and time.     Gait: Gait abnormal (Ambulates with cane).  Psychiatric:        Mood and Affect: Mood normal.        Behavior: Behavior normal.   Last CBC Lab Results  Component Value Date   WBC 3.3 (L) 12/31/2021   HGB 12.5 12/31/2021   HCT 38.5 12/31/2021   MCV 86 12/31/2021   MCH 27.8 12/31/2021   RDW 14.3 12/31/2021   PLT 222 [id number]   Last metabolic panel Lab Results  Component Value Date   GLUCOSE 99 12/31/2021   NA 137 12/31/2021   K 4.1 12/31/2021   CL 100 12/31/2021   CO2 24 12/31/2021   BUN 13 12/31/2021   CREATININE 0.80 12/31/2021   EGFR 68 12/31/2021   CALCIUM 9.0 12/31/2021   PHOS 3.1 10/23/2011   PROT 6.7 05/04/2021   ALBUMIN 4.1 05/04/2021   LABGLOB 2.6 05/04/2021   AGRATIO 1.6 05/04/2021   BILITOT 0.4 05/04/2021   ALKPHOS 62 05/04/2021   Williams 19 05/04/2021   ALT 12 05/04/2021   ANIONGAP 8 07/03/2018   Last lipids Lab Results  Component Value Date   CHOL 231 (H)  05/04/2021   HDL 72 05/04/2021  LDLCALC 135 (H) 05/04/2021   TRIG 138 05/04/2021   CHOLHDL 3.2 05/04/2021   Last hemoglobin A1c Lab Results  Component Value Date   HGBA1C 6.2 (H) 05/04/2021   Last thyroid functions Lab Results  Component Value Date   TSH 0.866 05/04/2021   Last vitamin D Lab Results  Component Value Date   VD25OH 21.6 (L) 06/04/2020   Last vitamin B12 and Folate Lab Results  Component Value Date   VITAMINB12 353 07/17/2014   FOLATE 28.0 07/17/2014     Assessment & Plan:   Problem List Items Addressed This Visit       HYPERTENSION, BENIGN ESSENTIAL - Primary    Returning to care today for HTN follow-up.  Amlodipine was increased to 10 mg daily at her last appointment.  Her blood pressure has improved today, 136/77. -No additional medication changes today -We will tentatively plan for follow-up in 3 months      Carpal tunnel syndrome of right wrist    She continues to endorse numbness in her right hand today.  Numbness improves when she wears her wrist brace.  She has only been wearing the brace at night. -I recommended more consistent use of her wrist brace, including during the day. -We will also increase gabapentin to 100 mg 3 times daily for improved symptom relief      Return in about 3 months (around 06/19/2022).   Johnette Abraham, MD

## 2022-03-19 NOTE — Assessment & Plan Note (Addendum)
She continues to endorse numbness in her right hand today.  Numbness improves when she wears her wrist brace.  She has only been wearing the brace at night. -I recommended more consistent use of her wrist brace, including during the day. -We will also increase gabapentin to 100 mg 3 times daily for improved symptom relief

## 2022-03-19 NOTE — Assessment & Plan Note (Signed)
Returning to care today for HTN follow-up.  Amlodipine was increased to 10 mg daily at her last appointment.  Her blood pressure has improved today, 136/77. -No additional medication changes today -We will tentatively plan for follow-up in 3 months

## 2022-03-19 NOTE — Patient Instructions (Signed)
It was a pleasure to see you today.  Thank you for giving [place] the opportunity to be involved in your care.  Below is a brief recap of your visit and next steps.  We will plan to see you again in 3 months.  Summary Increase gabapentin to 100 mg three times daily Continue amlodipine 10 mg daily

## 2022-06-15 ENCOUNTER — Other Ambulatory Visit: Payer: Self-pay | Admitting: Internal Medicine

## 2022-06-15 DIAGNOSIS — I10 Essential (primary) hypertension: Secondary | ICD-10-CM

## 2022-06-18 ENCOUNTER — Ambulatory Visit (INDEPENDENT_AMBULATORY_CARE_PROVIDER_SITE_OTHER): Payer: Medicare Other | Admitting: Internal Medicine

## 2022-06-18 ENCOUNTER — Encounter: Payer: Self-pay | Admitting: Internal Medicine

## 2022-06-18 VITALS — BP 129/72 | HR 89 | Ht 60.0 in | Wt 133.4 lb

## 2022-06-18 DIAGNOSIS — E782 Mixed hyperlipidemia: Secondary | ICD-10-CM

## 2022-06-18 DIAGNOSIS — G5601 Carpal tunnel syndrome, right upper limb: Secondary | ICD-10-CM | POA: Diagnosis not present

## 2022-06-18 DIAGNOSIS — I10 Essential (primary) hypertension: Secondary | ICD-10-CM | POA: Diagnosis not present

## 2022-06-18 DIAGNOSIS — M810 Age-related osteoporosis without current pathological fracture: Secondary | ICD-10-CM | POA: Diagnosis not present

## 2022-06-18 DIAGNOSIS — R5382 Chronic fatigue, unspecified: Secondary | ICD-10-CM | POA: Diagnosis not present

## 2022-06-18 DIAGNOSIS — R7303 Prediabetes: Secondary | ICD-10-CM | POA: Diagnosis not present

## 2022-06-18 DIAGNOSIS — Z139 Encounter for screening, unspecified: Secondary | ICD-10-CM | POA: Diagnosis not present

## 2022-06-18 MED ORDER — GABAPENTIN 100 MG PO CAPS: 100.0000 mg | ORAL_CAPSULE | Freq: Three times a day (TID) | ORAL | 2 refills | Status: DC | PRN

## 2022-06-18 MED ORDER — AMLODIPINE BESYLATE 10 MG PO TABS: 10.0000 mg | ORAL_TABLET | Freq: Every day | ORAL | 1 refills | Status: DC

## 2022-06-18 NOTE — Addendum Note (Signed)
Addended by: Telford Nab on: 06/18/2022 04:02 PM   Modules accepted: Orders

## 2022-06-18 NOTE — Progress Notes (Signed)
Established Patient Office Visit  Subjective   Patient ID: Sandra Williams, female    DOB: [birth year]  Age: 89 y.o. MRN: [id number]  Chief Complaint  Patient presents with   Hypertension    Follow up   Sandra Williams returns to care today for routine follow-up.  Sandra Williams was last evaluated by me on 3/15.  Gabapentin was increased to 100 mg 3 times daily for improved treatment of carpal tunnel syndrome.  I also recommended that Sandra Williams wear her wrist brace more consistently.  There have been no acute interval events.  Sandra Williams reports feeling well today.  Sandra Williams is asymptomatic and has no acute concerns to discuss.  Past Medical History:  Diagnosis Date   Arthritis    ARTHRITIS 10/07/2006   Qualifier: Diagnosis of  By: Erby Pian MD, Cornelius     Cancer [facility])    cecal carcinoma   Chest pain 10/22/2012   Colon cancer ([facility]) 02/01/2012   Stage I (T2, N0, M0) cancer the cecum status post resection by Dr. Lovell Sheehan with 14 lymph nodes found all of which were negative. Sandra Williams had no evidence for metastatic disease on CT scan of the abdomen and pelvis either. Her date of surgery was 10/22/2011.  Not in need of chemotherapy for her stage I cancer.    GERD (gastroesophageal reflux disease)    HTN (hypertension)    Hyperlipidemia    Iron deficiency anemia 02/01/2012   At time of colon cancer presentation   Knee joint pain 2014   right   Lipoma of right forearm s/p excision 07/06/2018    OA (osteoarthritis) of knee 06/27/2012   Osteoporosis    Rotator cuff tear arthropathy 12/11/2013   Small bowel obstruction ([facility]) 09/14/2017   Past Surgical History:  Procedure Laterality Date   ABDOMINAL HYSTERECTOMY     partial   APPENDECTOMY     APH   CATARACT EXTRACTION Bilateral    CESAREAN SECTION     COLON SURGERY     dental implant     LIPOMA EXCISION Right 07/06/2018   Procedure: EXCISION LIPOMA RIGHT FOREARM;  Surgeon: Vickki Hearing, MD;  Location: [facility];  Service: Orthopedics;  Laterality: Right;    PARTIAL COLECTOMY  10/22/2011   Procedure: PARTIAL COLECTOMY;  Surgeon: Dalia Heading, MD;  Location: [facility];  Service: General;  Laterality: N/A;   TOTAL KNEE ARTHROPLASTY Right 01/30/2014   Procedure: TOTAL KNEE ARTHROPLASTY;  Surgeon: Vickki Hearing, MD;  Location: [facility];  Service: Orthopedics;  Laterality: Right;   Social History   Tobacco Use   Smoking status: Never   Smokeless tobacco: Never  Vaping Use   Vaping Use: Never used  Substance Use Topics   Alcohol use: No   Drug use: No   Family History  Problem Relation Age of Onset   Hypertension Mother    Cancer Sister    Breast cancer Daughter    Hypertension Daughter    Colon cancer Neg Hx    Liver disease Neg Hx    Allergies  Allergen Reactions   Aspirin     REACTION: Upset stomach if takes regular and uncoated if takes high dose   Review of Systems  Constitutional:  Negative for chills and fever.  HENT:  Negative for sore throat.   Respiratory:  Negative for cough and shortness of breath.   Cardiovascular:  Negative for chest pain, palpitations and leg swelling.  Gastrointestinal:  Negative for abdominal pain, blood in stool, constipation, diarrhea, nausea and  vomiting.  Genitourinary:  Negative for dysuria and hematuria.  Musculoskeletal:  Negative for myalgias.  Skin:  Negative for itching and rash.  Neurological:  Negative for dizziness and headaches.  Psychiatric/Behavioral:  Negative for depression and suicidal ideas.       Objective:     BP 129/72   Pulse 89   Ht 5' (1.524 m)   Wt 133 lb 6.4 oz (60.5 kg)   SpO2 96%   BMI 26.05 kg/m  BP Readings from Last 3 Encounters:  06/18/22 129/72  03/19/22 136/77  02/12/22 (!) 155/76   Physical Exam Vitals reviewed.  Constitutional:      General: Sandra Williams is not in acute distress.    Appearance: Normal appearance. Sandra Williams is not toxic-appearing.  HENT:     Head: Normocephalic and atraumatic.     Right Ear: External ear normal.     Left Ear: External  ear normal.     Nose: Nose normal. No congestion or rhinorrhea.     Mouth/Throat:     Mouth: Mucous membranes are moist.     Pharynx: Oropharynx is clear. No oropharyngeal exudate or posterior oropharyngeal erythema.  Eyes:     General: No scleral icterus.    Extraocular Movements: Extraocular movements intact.     Conjunctiva/sclera: Conjunctivae normal.     Pupils: Pupils are equal, round, and reactive to light.  Cardiovascular:     Rate and Rhythm: Normal rate and regular rhythm.     Pulses: Normal pulses.     Heart sounds: Normal heart sounds. No murmur heard.    No friction rub. No gallop.  Pulmonary:     Effort: Pulmonary effort is normal.     Breath sounds: Normal breath sounds. No wheezing, rhonchi or rales.  Abdominal:     General: Abdomen is flat. Bowel sounds are normal. There is no distension.     Palpations: Abdomen is soft.     Tenderness: There is no abdominal tenderness.  Musculoskeletal:        General: No swelling (Trace, nonpitting bilateral ankle edema). Normal range of motion.     Cervical back: Normal range of motion.     Right lower leg: Edema present.     Left lower leg: Edema present.  Lymphadenopathy:     Cervical: No cervical adenopathy.  Skin:    General: Skin is warm and dry.     Capillary Refill: Capillary refill takes less than 2 seconds.     Coloration: Skin is not jaundiced.     Findings: Lesion (furuncle on posterior neck) present.  Neurological:     General: No focal deficit present.     Mental Status: Sandra Williams is alert and oriented to person, place, and time.     Gait: Gait abnormal (Ambulates with cane).  Psychiatric:        Mood and Affect: Mood normal.        Behavior: Behavior normal.   Last CBC Lab Results  Component Value Date   WBC 3.3 (L) 12/31/2021   HGB 12.5 12/31/2021   HCT 38.5 12/31/2021   MCV 86 12/31/2021   MCH 27.8 12/31/2021   RDW 14.3 12/31/2021   PLT 222 12/31/2021   Last metabolic panel Lab Results  Component  Value Date   GLUCOSE 99 12/31/2021   NA 137 12/31/2021   K 4.1 12/31/2021   CL 100 12/31/2021   CO2 24 12/31/2021   BUN 13 12/31/2021   CREATININE 0.80 12/31/2021   EGFR 68 12/31/2021  CALCIUM 9.0 12/31/2021   PHOS 3.1 10/23/2011   PROT 6.7 05/04/2021   ALBUMIN 4.1 05/04/2021   LABGLOB 2.6 05/04/2021   AGRATIO 1.6 05/04/2021   BILITOT 0.4 05/04/2021   ALKPHOS 62 05/04/2021   AST 19 05/04/2021   ALT 12 05/04/2021   ANIONGAP 8 07/03/2018   Last lipids Lab Results  Component Value Date   CHOL 231 (H) 05/04/2021   HDL 72 05/04/2021   LDLCALC 135 (H) 05/04/2021   TRIG 138 05/04/2021   CHOLHDL 3.2 05/04/2021   Last hemoglobin A1c Lab Results  Component Value Date   HGBA1C 6.2 (H) 05/04/2021   Last thyroid functions Lab Results  Component Value Date   TSH 0.866 05/04/2021   Last vitamin D Lab Results  Component Value Date   VD25OH 21.6 (L) 06/04/2020   Last vitamin B12 and Folate Lab Results  Component Value Date   VITAMINB12 353 07/17/2014   FOLATE 28.0 07/17/2014     Assessment & Plan:   Problem List Items Addressed This Visit       HYPERTENSION, BENIGN ESSENTIAL    BP remains well-controlled on amlodipine 10 mg daily. -No medication changes are indicated today      Carpal tunnel syndrome of right wrist    Symptoms have improved.  Sandra Williams is wearing her wrist brace more consistently.  Not requiring gabapentin frequently. -Gabapentin refilled today.  Recommend switching to 3 times daily as needed instead of taking on a scheduled basis.      HLD (hyperlipidemia) - Primary    Sandra Williams has not prescribed any cholesterol-lowering medication currently.  Lipid panel last updated in May 2023.  Repeat lipid panel previously ordered in December and will be completed today.      Return in about 3 months (around 09/18/2022).   Billie Lade, MD

## 2022-06-18 NOTE — Patient Instructions (Signed)
It was a pleasure to see you today.  Thank you for giving [place] the opportunity to be involved in your care.  Below is a brief recap of your visit and next steps.  We will plan to see you again in 3 months.  Summary No medication changes today Amlodipine and gabapentin have been refilled Update labs today Follow up in 3 months

## 2022-06-18 NOTE — Assessment & Plan Note (Signed)
Symptoms have improved.  She is wearing her wrist brace more consistently.  Not requiring gabapentin frequently. -Gabapentin refilled today.  Recommend switching to 3 times daily as needed instead of taking on a scheduled basis.

## 2022-06-18 NOTE — Assessment & Plan Note (Signed)
BP remains well-controlled on amlodipine 10 mg daily. -No medication changes are indicated today

## 2022-06-18 NOTE — Assessment & Plan Note (Signed)
She has not prescribed any cholesterol-lowering medication currently.  Lipid panel last updated in May 2023.  Repeat lipid panel previously ordered in December and will be completed today.

## 2022-06-19 LAB — CBC WITH DIFFERENTIAL/PLATELET
Basophils Absolute: 0.1 10*3/uL (ref 0.0–0.2)
Basos: 1 %
EOS (ABSOLUTE): 0.1 10*3/uL (ref 0.0–0.4)
Eos: 2 %
Hematocrit: 35.3 % (ref 34.0–46.6)
Hemoglobin: 11.4 g/dL (ref 11.1–15.9)
Immature Grans (Abs): 0 10*3/uL (ref 0.0–0.1)
Immature Granulocytes: 0 %
Lymphocytes Absolute: 2.2 10*3/uL (ref 0.7–3.1)
Lymphs: 37 %
MCH: 28 pg (ref 26.6–33.0)
MCHC: 32.3 g/dL (ref 31.5–35.7)
MCV: 87 fL (ref 79–97)
Monocytes Absolute: 0.6 10*3/uL (ref 0.1–0.9)
Monocytes: 11 %
Neutrophils Absolute: 2.9 10*3/uL (ref 1.4–7.0)
Neutrophils: 49 %
Platelets: 294 10*3/uL (ref 150–450)
RBC: 4.07 x10E6/uL (ref 3.77–5.28)
RDW: 14 % (ref 11.7–15.4)
WBC: 5.9 10*3/uL (ref 3.4–10.8)

## 2022-06-19 LAB — IRON,TIBC AND FERRITIN PANEL
Ferritin: 73 ng/mL (ref 15–150)
Iron Saturation: 18 % (ref 15–55)
Iron: 55 ug/dL (ref 27–139)
Total Iron Binding Capacity: 298 ug/dL (ref 250–450)
UIBC: 243 ug/dL (ref 118–369)

## 2022-06-19 LAB — TSH+FREE T4
Free T4: 1.27 ng/dL (ref 0.82–1.77)
TSH: 1.58 u[IU]/mL (ref 0.450–4.500)

## 2022-06-19 LAB — CMP14+EGFR
ALT: 13 IU/L (ref 0–32)
AST: 22 IU/L (ref 0–40)
Albumin: 4.1 g/dL (ref 3.6–4.6)
Alkaline Phosphatase: 76 IU/L (ref 44–121)
BUN/Creatinine Ratio: 22 (ref 12–28)
BUN: 20 mg/dL (ref 10–36)
Bilirubin Total: 0.3 mg/dL (ref 0.0–1.2)
CO2: 22 mmol/L (ref 20–29)
Calcium: 9.5 mg/dL (ref 8.7–10.3)
Chloride: 108 mmol/L — ABNORMAL HIGH (ref 96–106)
Creatinine, Ser: 0.91 mg/dL (ref 0.57–1.00)
Globulin, Total: 2.8 g/dL (ref 1.5–4.5)
Glucose: 84 mg/dL (ref 70–99)
Potassium: 4.8 mmol/L (ref 3.5–5.2)
Sodium: 144 mmol/L (ref 134–144)
Total Protein: 6.9 g/dL (ref 6.0–8.5)
eGFR: 58 mL/min/{1.73_m2} — ABNORMAL LOW (ref >59)

## 2022-06-19 LAB — VITAMIN D 25 HYDROXY (VIT D DEFICIENCY, FRACTURES): Vit D, 25-Hydroxy: 28.8 ng/mL — ABNORMAL LOW (ref 30.0–100.0)

## 2022-06-19 LAB — B12 AND FOLATE PANEL
Folate: >20 ng/mL (ref >3.0)
Vitamin B-12: 535 pg/mL (ref 232–1245)

## 2022-08-03 ENCOUNTER — Encounter: Payer: Self-pay | Admitting: Internal Medicine

## 2022-08-03 ENCOUNTER — Ambulatory Visit (INDEPENDENT_AMBULATORY_CARE_PROVIDER_SITE_OTHER): Payer: Medicare Other | Admitting: Internal Medicine

## 2022-08-03 VITALS — BP 127/71 | HR 96 | Ht 60.0 in | Wt 131.6 lb

## 2022-08-03 DIAGNOSIS — G5601 Carpal tunnel syndrome, right upper limb: Secondary | ICD-10-CM | POA: Diagnosis not present

## 2022-08-03 DIAGNOSIS — I10 Essential (primary) hypertension: Secondary | ICD-10-CM | POA: Diagnosis not present

## 2022-08-03 DIAGNOSIS — K1379 Other lesions of oral mucosa: Secondary | ICD-10-CM

## 2022-08-03 DIAGNOSIS — M7989 Other specified soft tissue disorders: Secondary | ICD-10-CM | POA: Diagnosis not present

## 2022-08-03 NOTE — Progress Notes (Unsigned)
Acute Office Visit  Subjective:    Patient ID: Sandra Williams, female    DOB: 1925-11-22, 89 y.o.   MRN: [id number]  Chief Complaint  Patient presents with   Numbness    Patient is having numbness in her right arm    Oral Pain    Patient is having pain and swelling in the upper part of her mouth     HPI Patient is in today for complaint of numbness in her right hand, which is chronic.  She also has weakness of the left hand.  She has been told to apply wrist brace for Carpal tunnel syndrome.  She was also given gabapentin, but has not been taking it recently.  Denies any recent injury.  She reports a soft cystlike mass in the hard palate area for the last 2 days.  Denies any pain or burning.  Reports that it has been regressing in size.  Denies any fever or chills.  Denies any dysphagia currently.  She also reports recent worsening of bilateral leg swelling.  Denies any dyspnea currently.  Of note, her dose of amlodipine was increased in 04/24 to 10 mg.  Her BP is tightly controlled for her age currently.    Past Medical History:  Diagnosis Date   Arthritis    ARTHRITIS 10/07/2006   Qualifier: Diagnosis of  By: Erby Pian MD, Cornelius     Cancer [facility])    cecal carcinoma   Chest pain 10/22/2012   Colon cancer (HCC) 02/01/2012   Stage I (T2, N0, M0) cancer the cecum status post resection by Dr. Lovell Sheehan with 14 lymph nodes found all of which were negative. She had no evidence for metastatic disease on CT scan of the abdomen and pelvis either. Her date of surgery was 10/22/2011.  Not in need of chemotherapy for her stage I cancer.    GERD (gastroesophageal reflux disease)    HTN (hypertension)    Hyperlipidemia    Iron deficiency anemia 02/01/2012   At time of colon cancer presentation   Knee joint pain 2014   right   Lipoma of right forearm s/p excision 07/06/2018    OA (osteoarthritis) of knee 06/27/2012   Osteoporosis    Rotator cuff tear arthropathy 12/11/2013   Small bowel  obstruction (HCC) 09/14/2017    Past Surgical History:  Procedure Laterality Date   ABDOMINAL HYSTERECTOMY     partial   APPENDECTOMY     APH   CATARACT EXTRACTION Bilateral    CESAREAN SECTION     COLON SURGERY     dental implant     LIPOMA EXCISION Right 07/06/2018   Procedure: EXCISION LIPOMA RIGHT FOREARM;  Surgeon: Vickki Hearing, MD;  Location: [facility];  Service: Orthopedics;  Laterality: Right;   PARTIAL COLECTOMY  10/22/2011   Procedure: PARTIAL COLECTOMY;  Surgeon: Dalia Heading, MD;  Location: [facility];  Service: General;  Laterality: N/A;   TOTAL KNEE ARTHROPLASTY Right 01/30/2014   Procedure: TOTAL KNEE ARTHROPLASTY;  Surgeon: Vickki Hearing, MD;  Location: [facility];  Service: Orthopedics;  Laterality: Right;    Family History  Problem Relation Age of Onset   Hypertension Mother    Cancer Sister    Breast cancer Daughter    Hypertension Daughter    Colon cancer Neg Hx    Liver disease Neg Hx     Social History   Socioeconomic History   Marital status: Single    Spouse name: Not on file   Number  of children: 3   Years of education: Not on file   Highest education level: Not on file  Occupational History   Not on file  Tobacco Use   Smoking status: Never   Smokeless tobacco: Never  Vaping Use   Vaping status: Never Used  Substance and Sexual Activity   Alcohol use: No   Drug use: No   Sexual activity: Never    Birth control/protection: Surgical  Other Topics Concern   Not on file  Social History Narrative   Lives with daughter in [place], [place]       Enjoy: sits on the porch- people watching, use to like bingo, walks around house a lot      Diet: eats all food groups   Caffeine: coffee- 3-4 days a week, some tea, drinks coke 3 times a week    Water:  4-5 cups roughly      Wears seat belt   No longer drives   Smoke detectors at home   No weapons she knows of    Social Determinants of Health   Financial Resource Strain: Medium Risk  (02/09/2022)   Overall Financial Resource Strain (CARDIA)    Difficulty of Paying Living Expenses: Somewhat hard  Food Insecurity: No Food Insecurity (02/09/2022)   Hunger Vital Sign    Worried About Running Out of Food in the Last Year: Never true    Ran Out of Food in the Last Year: Never true  Recent Concern: Food Insecurity - Food Insecurity Present (01/28/2022)   Hunger Vital Sign    Worried About Running Out of Food in the Last Year: Sometimes true    Ran Out of Food in the Last Year: Sometimes true  Transportation Needs: No Transportation Needs (02/09/2022)   PRAPARE - Administrator, Civil Service (Medical): No    Lack of Transportation (Non-Medical): No  Recent Concern: Transportation Needs - Unmet Transportation Needs (01/28/2022)   PRAPARE - Transportation    Lack of Transportation (Medical): Yes    Lack of Transportation (Non-Medical): Yes  Physical Activity: Sufficiently Active (01/28/2022)   Exercise Vital Sign    Days of Exercise per Week: 7 days    Minutes of Exercise per Session: 30 min  Stress: Stress Concern Present (01/28/2022)   [employer] of Occupational Health - Occupational Stress Questionnaire    Feeling of Stress : To some extent  Social Connections: Socially Isolated (01/28/2022)   Social Connection and Isolation Panel [NHANES]    Frequency of Communication with Friends and Family: More than three times a week    Frequency of Social Gatherings with Friends and Family: More than three times a week    Attends Religious Services: Never    Database administrator or Organizations: No    Attends Banker Meetings: Never    Marital Status: Never married  Intimate Partner Violence: Not At Risk (01/28/2022)   Humiliation, Afraid, Rape, and Kick questionnaire    Fear of Current or Ex-Partner: No    Emotionally Abused: No    Physically Abused: No    Sexually Abused: No    Outpatient Medications Prior to Visit  Medication Sig Dispense Refill    acetaminophen (TYLENOL) 500 MG tablet Take 500 mg by mouth every 6 (six) hours as needed.     Ascorbic Acid (VITAMIN C ADULT GUMMIES PO) Take by mouth.     aspirin 81 MG EC tablet Take 81 mg by mouth daily.  gabapentin (NEURONTIN) 100 MG capsule Take 1 capsule (100 mg total) by mouth 3 (three) times daily as needed (pain). 90 capsule 2   amLODipine (NORVASC) 10 MG tablet Take 1 tablet (10 mg total) by mouth daily. 90 tablet 1   No facility-administered medications prior to visit.    Allergies  Allergen Reactions   Aspirin     REACTION: Upset stomach if takes regular and uncoated if takes high dose    Review of Systems  Constitutional:  Negative for chills and fever.  HENT:  Negative for congestion, sinus pressure, sinus pain, sore throat and trouble swallowing.        Oral lesion  Eyes:  Negative for pain and discharge.  Respiratory:  Negative for cough and shortness of breath.   Cardiovascular:  Positive for leg swelling. Negative for chest pain and palpitations.  Gastrointestinal:  Negative for abdominal pain, diarrhea, nausea and vomiting.  Endocrine: Negative for polydipsia and polyuria.  Genitourinary:  Negative for dysuria and hematuria.  Musculoskeletal:  Negative for neck pain and neck stiffness.  Skin:  Negative for rash.  Neurological:  Positive for numbness (R hand). Negative for dizziness and weakness.  Psychiatric/Behavioral:  Negative for agitation and behavioral problems.        Objective:    Physical Exam Vitals reviewed.  Constitutional:      General: She is not in acute distress.    Appearance: She is not diaphoretic.  HENT:     Head: Normocephalic and atraumatic.     Nose: Nose normal.     Mouth/Throat:     Palate: Mass (About 2 cm in diameter hard palate mass, likely mucocele) present.  Eyes:     General: No scleral icterus.    Extraocular Movements: Extraocular movements intact.  Cardiovascular:     Rate and Rhythm: Normal rate and regular  rhythm.     Heart sounds: Normal heart sounds. No murmur heard. Pulmonary:     Breath sounds: Normal breath sounds. No wheezing or rales.  Musculoskeletal:     Cervical back: Neck supple. No tenderness.     Right lower leg: Edema (2+) present.     Left lower leg: Edema (2+) present.  Skin:    General: Skin is warm.     Findings: No rash.  Neurological:     General: No focal deficit present.     Mental Status: She is alert and oriented to person, place, and time.  Psychiatric:        Mood and Affect: Mood normal.        Behavior: Behavior normal.     BP 127/71 (BP Location: Left Arm, Patient Position: Sitting, Cuff Size: Normal)   Pulse 96   Ht 5' (1.524 m)   Wt 131 lb 9.6 oz (59.7 kg)   SpO2 95%   BMI 25.70 kg/m  Wt Readings from Last 3 Encounters:  08/03/22 131 lb 9.6 oz (59.7 kg)  06/18/22 133 lb 6.4 oz (60.5 kg)  03/19/22 130 lb 3.2 oz (59.1 kg)        Assessment & Plan:   Problem List Items Addressed This Visit       Cardiovascular and Mediastinum   HYPERTENSION, BENIGN ESSENTIAL    BP Readings from Last 1 Encounters:  08/03/22 127/71   Well-controlled with amlodipine 10 mg ONCE DAILY Advised to take only half tablet of amlodipine for now as she has tightly controlled BP and has recent worsening of leg swelling Counseled for compliance with the  medications Advised DASH diet      Relevant Medications   amLODipine (NORVASC) 10 MG tablet     Digestive   Mucocele of mouth - Primary    Hard palate mass likely mucocele Usually regresses continuously Needs to get dentist/oral surgeon evaluation        Nervous and Auditory   Carpal tunnel syndrome of right wrist    Symptoms had reportedly improved with wrist brace, needs to use it regularly Continue gabapentin 100 mg PRN as advised by PCP, can avoid daytime dose if she has drowsiness        Other   Leg swelling    Likely due to chronic with venous insufficiency and/or recent increasing amlodipine  dose Take only half tablet of amlodipine for now - If elevated BP, can add ARB Leg elevation and compression socks as tolerated Follow low salt diet        Meds ordered this encounter  Medications   amLODipine (NORVASC) 10 MG tablet    Sig: Take 0.5 tablets (5 mg total) by mouth daily.     Anabel Halon, MD

## 2022-08-03 NOTE — Patient Instructions (Signed)
Please start taking only half tablet of Amlodipine once daily.  Please use compression socks for leg swelling. Please perform leg elevation as tolerated.  Please contact Dentist for oral cyst evaluation.

## 2022-08-04 MED ORDER — AMLODIPINE BESYLATE 10 MG PO TABS: 5.0000 mg | ORAL_TABLET | Freq: Every day | ORAL | Status: DC

## 2022-08-04 NOTE — Assessment & Plan Note (Signed)
Symptoms had reportedly improved with wrist brace, needs to use it regularly Continue gabapentin 100 mg PRN as advised by PCP, can avoid daytime dose if she has drowsiness

## 2022-08-04 NOTE — Assessment & Plan Note (Signed)
Hard palate mass likely mucocele Usually regresses continuously Needs to get dentist/oral surgeon evaluation

## 2022-08-04 NOTE — Assessment & Plan Note (Addendum)
Likely due to chronic with venous insufficiency and/or recent increasing amlodipine dose Take only half tablet of amlodipine for now - If elevated BP, can add ARB Leg elevation and compression socks as tolerated Follow low salt diet

## 2022-08-04 NOTE — Assessment & Plan Note (Signed)
BP Readings from Last 1 Encounters:  08/03/22 127/71   Well-controlled with amlodipine 10 mg ONCE DAILY Advised to take only half tablet of amlodipine for now as she has tightly controlled BP and has recent worsening of leg swelling Counseled for compliance with the medications Advised DASH diet

## 2022-09-24 ENCOUNTER — Ambulatory Visit: Payer: Medicare Other | Admitting: Internal Medicine

## 2022-09-27 ENCOUNTER — Ambulatory Visit
Admission: EM | Admit: 2022-09-27 | Discharge: 2022-09-27 | Disposition: A | Discharge status: Home / Self Care | Payer: Medicare Other | Attending: Nurse Practitioner | Admitting: Nurse Practitioner

## 2022-09-27 DIAGNOSIS — J22 Unspecified acute lower respiratory infection: Secondary | ICD-10-CM

## 2022-09-27 MED ORDER — AMOXICILLIN-POT CLAVULANATE 875-125 MG PO TABS: 1.0000 | ORAL_TABLET | Freq: Two times a day (BID) | ORAL | 0 refills | Status: DC

## 2022-09-27 NOTE — Discharge Instructions (Addendum)
Take medication as prescribed.  Recommend Coricidin HBP for cough. Increase fluids and allow for plenty of rest. May take over-the-counter Tylenol as needed for pain, fever, or general discomfort. Recommend normal saline nasal spray throughout the day to help with nasal congestion and runny nose. For your cough, recommend using a humidifier in your bedroom at nighttime during sleep and sleeping elevated on pillows while cough symptoms persist. If symptoms do not improve with this treatment, please follow-up with your primary care physician for further evaluation. Follow-up as needed.

## 2022-09-27 NOTE — ED Provider Notes (Signed)
[facility]    CSN: [id number] Arrival date & time: 09/27/22  1534      History   Chief Complaint No chief complaint on file.   HPI Sandra Williams is a 89 y.o. female.   The history is provided by the patient.   Patient presents with her daughter for complaints of cough that is been present for the past 2 weeks.  Patient's daughter states cough has been productive.  Patient's daughter denies fever, chills, headache, sore throat, ear pain, wheezing, difficulty breathing, abdominal pain, nausea, vomiting, or diarrhea.  Patient states that she does have some intermittent shortness of breath.  Patient has been taking over-the-counter Robitussin and Mucinex for her symptoms.  Past Medical History:  Diagnosis Date   Arthritis    ARTHRITIS 10/07/2006   Qualifier: Diagnosis of  By: Erby Pian MD, Cornelius     Cancer [facility])    cecal carcinoma   Chest pain 10/22/2012   Colon cancer (HCC) 02/01/2012   Stage I (T2, N0, M0) cancer the cecum status post resection by Dr. Lovell Sheehan with 14 lymph nodes found all of which were negative. She had no evidence for metastatic disease on CT scan of the abdomen and pelvis either. Her date of surgery was 10/22/2011.  Not in need of chemotherapy for her stage I cancer.    GERD (gastroesophageal reflux disease)    HTN (hypertension)    Hyperlipidemia    Iron deficiency anemia 02/01/2012   At time of colon cancer presentation   Knee joint pain 2014   right   Lipoma of right forearm s/p excision 07/06/2018    OA (osteoarthritis) of knee 06/27/2012   Osteoporosis    Rotator cuff tear arthropathy 12/11/2013   Small bowel obstruction (HCC) 09/14/2017    Patient Active Problem List   Diagnosis Date Noted   Mucocele of mouth 08/03/2022   Leg swelling 08/03/2022   Dizziness 02/18/2022   Bronchitis 12/31/2021   Bilateral leg edema 09/17/2021   Chronic fatigue 05/01/2021   Nodule of skin of right foot 07/29/2020   Low back pain 07/03/2020    Carpal tunnel syndrome of right wrist 11/06/2019   Need for immunization against influenza 11/06/2019   Osteoporosis of multiple sites 09/27/2019   Osteoarthritis    Primary osteoarthritis of right knee 10/03/2013   GERD (gastroesophageal reflux disease) 10/23/2012   Spinal stenosis of lumbar region 03/01/2008   HYPERTENSION, BENIGN ESSENTIAL 01/19/2008   HLD (hyperlipidemia) 10/07/2006   OVERACTIVE BLADDER 10/07/2006    Past Surgical History:  Procedure Laterality Date   ABDOMINAL HYSTERECTOMY     partial   APPENDECTOMY     APH   CATARACT EXTRACTION Bilateral    CESAREAN SECTION     COLON SURGERY     dental implant     LIPOMA EXCISION Right 07/06/2018   Procedure: EXCISION LIPOMA RIGHT FOREARM;  Surgeon: Vickki Hearing, MD;  Location: [facility];  Service: Orthopedics;  Laterality: Right;   PARTIAL COLECTOMY  10/22/2011   Procedure: PARTIAL COLECTOMY;  Surgeon: Dalia Heading, MD;  Location: [facility];  Service: General;  Laterality: N/A;   TOTAL KNEE ARTHROPLASTY Right 01/30/2014   Procedure: TOTAL KNEE ARTHROPLASTY;  Surgeon: Vickki Hearing, MD;  Location: [facility];  Service: Orthopedics;  Laterality: Right;    OB History   No obstetric history on file.      Home Medications    Prior to Admission medications   Medication Sig Start Date End Date Taking? Authorizing Provider  amoxicillin-clavulanate (AUGMENTIN) 875-125 MG tablet Take 1 tablet by mouth every 12 (twelve) hours. 09/27/22  Yes Josuel Koeppen-Warren, Sadie Haber, NP  acetaminophen (TYLENOL) 500 MG tablet Take 500 mg by mouth every 6 (six) hours as needed.    [provider]  amLODipine (NORVASC) 10 MG tablet Take 0.5 tablets (5 mg total) by mouth daily. 08/04/22   Anabel Halon, MD  Ascorbic Acid (VITAMIN C ADULT GUMMIES PO) Take by mouth.    [provider]  aspirin 81 MG EC tablet Take 81 mg by mouth daily.     [provider]  gabapentin (NEURONTIN) 100 MG capsule Take 1 capsule (100 mg  total) by mouth 3 (three) times daily as needed (pain). 06/18/22 09/16/22  Billie Lade, MD    Family History Family History  Problem Relation Age of Onset   Hypertension Mother    Cancer Sister    Breast cancer Daughter    Hypertension Daughter    Colon cancer Neg Hx    Liver disease Neg Hx     Social History Social History   Tobacco Use   Smoking status: Never   Smokeless tobacco: Never  Vaping Use   Vaping status: Never Used  Substance Use Topics   Alcohol use: No   Drug use: No     Allergies   Aspirin   Review of Systems Review of Systems Per HPI  Physical Exam Triage Vital Signs ED Triage Vitals  Encounter Vitals Group     BP 09/27/22 1640 (!) 159/80     Systolic BP Percentile --      Diastolic BP Percentile --      Pulse Rate 09/27/22 1640 84     Resp 09/27/22 1640 16     Temp 09/27/22 1640 97.9 F (36.6 C)     Temp Source 09/27/22 1640 Oral     SpO2 09/27/22 1640 94 %     Weight --      Height --      Head Circumference --      Peak Flow --      Pain Score 09/27/22 1644 0     Pain Loc --      Pain Education --      Exclude from Growth Chart --    No data found.  Updated Vital Signs BP (!) 159/80 (BP Location: Right Arm)   Pulse 84   Temp 97.9 F (36.6 C) (Oral)   Resp 16   SpO2 94%   Visual Acuity Right Eye Distance:   Left Eye Distance:   Bilateral Distance:    Right Eye Near:   Left Eye Near:    Bilateral Near:     Physical Exam Vitals and nursing note reviewed.  Constitutional:      General: She is not in acute distress.    Appearance: Normal appearance.  HENT:     Head: Normocephalic.     Right Ear: Tympanic membrane, ear canal and external ear normal.     Left Ear: Tympanic membrane, ear canal and external ear normal.     Nose: Congestion present.     Right Turbinates: Enlarged and swollen.     Left Turbinates: Enlarged and swollen.     Mouth/Throat:     Lips: Pink.     Mouth: Mucous membranes are moist.      Pharynx: Uvula midline. Posterior oropharyngeal erythema and postnasal drip present.  Eyes:     Extraocular Movements: Extraocular movements intact.  Conjunctiva/sclera: Conjunctivae normal.     Pupils: Pupils are equal, round, and reactive to light.  Cardiovascular:     Rate and Rhythm: Normal rate and regular rhythm.     Pulses: Normal pulses.     Heart sounds: Normal heart sounds.  Pulmonary:     Effort: Pulmonary effort is normal. No respiratory distress.     Breath sounds: Normal breath sounds. No stridor. No wheezing, rhonchi or rales.  Abdominal:     General: Bowel sounds are normal.     Palpations: Abdomen is soft.     Tenderness: There is no abdominal tenderness.  Musculoskeletal:     Cervical back: Normal range of motion.  Lymphadenopathy:     Cervical: No cervical adenopathy.  Skin:    General: Skin is warm and dry.  Neurological:     General: No focal deficit present.     Mental Status: She is alert and oriented to person, place, and time.  Psychiatric:        Mood and Affect: Mood normal.        Behavior: Behavior normal.      UC Treatments / Results  Labs (all labs ordered are listed, but only abnormal results are displayed) Labs Reviewed - No data to display  EKG   Radiology No results found.  Procedures Procedures (including critical care time)  Medications Ordered in UC Medications - No data to display  Initial Impression / Assessment and Plan / UC Course  I have reviewed the triage vital signs and the nursing notes.  Pertinent labs & imaging results that were available during my care of the patient were reviewed by me and considered in my medical decision making (see chart for details).  The patient is well-appearing, she is in no acute distress, vital signs are stable.  On exam, lung sounds are clear throughout.  Suspect cough may be of viral etiology; however, given the patient's age and duration of the cough, we will treat empirically  for a lower respiratory infection.  Will treat with Augmentin 875/125 mg tablets.  For the cough, patient's daughter was advised to purchase over-the-counter Coricidin HBP while cough symptoms persist.  Supportive care recommendations were provided and discussed with the patient to include increasing fluids, over-the-counter Tylenol for pain or discomfort, sleeping elevated on pillows, and use of a humidifier while symptoms persist.  Patient's daughter was advised if symptoms worsen to include wheezing, difficulty breathing, or new symptoms of fever, to follow-up in the emergency department.  Patient's daughter is in agreement with this plan of care and verbalizes understanding.  All questions were answered.  Patient stable for discharge.  Final Clinical Impressions(s) / UC Diagnoses   Final diagnoses:  Lower respiratory infection     Discharge Instructions      Take medication as prescribed.  Recommend Coricidin HBP for cough. Increase fluids and allow for plenty of rest. May take over-the-counter Tylenol as needed for pain, fever, or general discomfort. Recommend normal saline nasal spray throughout the day to help with nasal congestion and runny nose. For your cough, recommend using a humidifier in your bedroom at nighttime during sleep and sleeping elevated on pillows while cough symptoms persist. If symptoms do not improve with this treatment, please follow-up with your primary care physician for further evaluation. Follow-up as needed.     ED Prescriptions     Medication Sig Dispense Auth. Provider   amoxicillin-clavulanate (AUGMENTIN) 875-125 MG tablet Take 1 tablet by mouth every 12 (twelve) hours.  14 tablet Wai Litt-Warren, Sadie Haber, NP      PDMP not reviewed this encounter.   Abran Cantor, NP 09/27/22 1739

## 2022-09-27 NOTE — ED Triage Notes (Signed)
Pt c/o cough, productive  x 2 weeks

## 2023-01-31 ENCOUNTER — Telehealth: Payer: Self-pay | Admitting: Internal Medicine

## 2023-01-31 ENCOUNTER — Ambulatory Visit: Payer: Self-pay | Admitting: Internal Medicine

## 2023-01-31 NOTE — Telephone Encounter (Unsigned)
Copied from CRM [id number]. Topic: Clinical - Red Word Triage >> Jan 31, 2023  1:51 PM Shelah Lewandowsky wrote: Red Word that prompted transfer to Nurse Triage: Patient is having dizziness

## 2023-01-31 NOTE — Telephone Encounter (Signed)
Chief Complaint: dizziness Symptoms: dizziness in the AM for one month with weakness Frequency: one month Pertinent Negatives: Patient denies CP, SOB, passing out, LOC, fever Disposition: [] ED /[x] Urgent Care (no appt availability in office) / [] Appointment(In office/virtual)/ []  [place] [facility]/ [] Home Care/ [x] Refused Recommended Disposition /[] [place] [facility]/ []  Follow-up with PCP  Additional Notes: Pt's daughter Britta Mccreedy called on behalf of the pt. This RN spoke to both pt and sister. Pt states she has been dizzy in the AM for one month. Pt states dizziness improves after eating. Pt currently states she is at the table and eating. Pt denies CP, SOB, LOC, fever, one-sided weakness. Daughter denies one-sided weakness/paralysis, slurred speech. Daughter Britta Mccreedy checked pt's BP, BP 123/70. Pt states when she is dizzy, she needs to use her cane to walk. Daughter states pt has not been seen by her PCP yet for dizziness. Per protocol, this RN advised pt be seen by a HCP within 24 hours. No availability at pt's office within that time frame. This RN suggested to schedule pt an appt at a different office or that the pt go to UC. Daughter declined UC and declined a different clinic. This RN advised she will route this to the appropriate people for follow-up. Pt hoping to be scheduled at this office. This RN advised pt call back for any worsening. Additionally, daughter verbalized she will take pt to the ED if symptoms worsen.    Copied from CRM [id number]. Topic: Clinical - Red Word Triage >> Jan 31, 2023  1:53 PM Shelah Lewandowsky wrote: Red Word that prompted transfer to Nurse Triage: patient experiencing dizziness Reason for Disposition  [1] MODERATE dizziness (e.g., interferes with normal activities) AND [2] has NOT been evaluated by doctor (or NP/PA) for this  (Exception: Dizziness caused by heat exposure, sudden standing, or poor fluid intake.)  Answer Assessment - Initial Assessment  Questions 1. DESCRIPTION: "Describe your dizziness."     "Just dizzy", states she is better after eating. 2. LIGHTHEADED: "Do you feel lightheaded?" (e.g., somewhat faint, woozy, weak upon standing)     "No, not directly. I am sitting at the table eating something." 3. VERTIGO: "Do you feel like either you or the room is spinning or tilting?" (i.e. vertigo)     No 4. SEVERITY: "How bad is it?"  "Do you feel like you are going to faint?" "Can you stand and walk?"   - MILD: Feels slightly dizzy, but walking normally.   - MODERATE: Feels unsteady when walking, but not falling; interferes with normal activities (e.g., school, work).   - SEVERE: Unable to walk without falling, or requires assistance to walk without falling; feels like passing out now.      "I use my cane. I can walk without it, but I'm afraid I might fall." 5. ONSET:  "When did the dizziness begin?"     Every morning for a month. 6. AGGRAVATING FACTORS: "Does anything make it worse?" (e.g., standing, change in head position)     Better after I eat, worse in the mornings before eating. 7. HEART RATE: "Can you tell me your heart rate?" "How many beats in 15 seconds?"  (Note: not all patients can do this)       Not sure 8. CAUSE: "What do you think is causing the dizziness?"     Not sure 9. RECURRENT SYMPTOM: "Have you had dizziness before?" If Yes, ask: "When was the last time?" "What happened that time?"     Every morning  for a month. 10. OTHER SYMPTOMS: "Do you have any other symptoms?" (e.g., fever, chest pain, vomiting, diarrhea, bleeding)       Cramps in legs ("in the muscle part"). Pt denies CP, SOB, passing out.  Protocols used: Dizziness - Lightheadedness-A-AH

## 2023-02-01 NOTE — Telephone Encounter (Signed)
Appt scheduled, patient/daughter aware

## 2023-02-02 NOTE — Progress Notes (Unsigned)
   Established Patient Office Visit   Subjective  Patient ID: Sandra Williams, female    DOB: 01/07/1925  Age: 89 y.o. MRN: [id number]  No chief complaint on file.   She  has a past medical history of Arthritis, ARTHRITIS (10/07/2006), Cancer [facility]), Chest pain (10/22/2012), Colon cancer (HCC) (02/01/2012), GERD (gastroesophageal reflux disease), HTN (hypertension), Hyperlipidemia, Iron deficiency anemia (02/01/2012), Knee joint pain (2014), Lipoma of right forearm s/p excision 07/06/2018, OA (osteoarthritis) of knee (06/27/2012), Osteoporosis, Rotator cuff tear arthropathy (12/11/2013), and Small bowel obstruction (HCC) (09/14/2017).  Dizziness    Review of Systems  Neurological:  Positive for dizziness.      Objective:     There were no vitals taken for this visit. {Vitals History (Optional):23777}  Physical Exam   No results found for any visits on 02/03/23.  The ASCVD Risk score (Arnett DK, et al., 2019) failed to calculate for the following reasons:   The 2019 ASCVD risk score is only valid for ages 64 to 44    Assessment & Plan:  There are no diagnoses linked to this encounter.  No follow-ups on file.   Cruzita Lederer Newman Nip, FNP

## 2023-02-02 NOTE — Patient Instructions (Signed)

## 2023-02-03 ENCOUNTER — Encounter: Payer: Self-pay | Admitting: Family Medicine

## 2023-02-03 ENCOUNTER — Ambulatory Visit (INDEPENDENT_AMBULATORY_CARE_PROVIDER_SITE_OTHER): Payer: Medicare Other | Admitting: Family Medicine

## 2023-02-03 VITALS — BP 122/74 | HR 109 | Ht 60.0 in | Wt 129.0 lb

## 2023-02-03 DIAGNOSIS — R42 Dizziness and giddiness: Secondary | ICD-10-CM

## 2023-02-03 DIAGNOSIS — R5382 Chronic fatigue, unspecified: Secondary | ICD-10-CM | POA: Diagnosis not present

## 2023-02-03 DIAGNOSIS — Z23 Encounter for immunization: Secondary | ICD-10-CM | POA: Diagnosis not present

## 2023-02-03 LAB — EKG 12-LEAD

## 2023-02-03 NOTE — Assessment & Plan Note (Signed)
Chronic, EKG and labs ordered Orthostatic blood pressure measurements: Supine: 120/82  Standing:  116/84 Labs ordered to rule out deficiency, BMP, CBC, iron panel,  Discussed , sleep with your head slightly elevated and rise slowly from bed in the morning to prevent sudden dizziness. Limiting salt, caffeine, and alcohol may also reduce fluid buildup in the inner ear, which can lessen vertigo episodes. Avoid positions or activities that trigger symptoms, such as bending over or quickly turning your head. Additionally, focus on slow, steady movements

## 2023-02-04 ENCOUNTER — Encounter: Payer: Self-pay | Admitting: Family Medicine

## 2023-02-04 LAB — CBC WITH DIFFERENTIAL/PLATELET
Basophils Absolute: 0.1 10*3/uL (ref 0.0–0.2)
Basos: 1 %
EOS (ABSOLUTE): 0.2 10*3/uL (ref 0.0–0.4)
Eos: 3 %
Hematocrit: 39.4 % (ref 34.0–46.6)
Hemoglobin: 12.6 g/dL (ref 11.1–15.9)
Immature Grans (Abs): 0 10*3/uL (ref 0.0–0.1)
Immature Granulocytes: 0 %
Lymphocytes Absolute: 2.4 10*3/uL (ref 0.7–3.1)
Lymphs: 31 %
MCH: 28.2 pg (ref 26.6–33.0)
MCHC: 32 g/dL (ref 31.5–35.7)
MCV: 88 fL (ref 79–97)
Monocytes Absolute: 0.7 10*3/uL (ref 0.1–0.9)
Monocytes: 9 %
Neutrophils Absolute: 4.2 10*3/uL (ref 1.4–7.0)
Neutrophils: 56 %
Platelets: 327 10*3/uL (ref 150–450)
RBC: 4.47 x10E6/uL (ref 3.77–5.28)
RDW: 14.3 % (ref 11.7–15.4)
WBC: 7.5 10*3/uL (ref 3.4–10.8)

## 2023-02-04 LAB — BMP8+EGFR
BUN/Creatinine Ratio: 18 (ref 12–28)
BUN: 17 mg/dL (ref 10–36)
CO2: 24 mmol/L (ref 20–29)
Calcium: 9.4 mg/dL (ref 8.7–10.3)
Chloride: 109 mmol/L — ABNORMAL HIGH (ref 96–106)
Creatinine, Ser: 0.94 mg/dL (ref 0.57–1.00)
Glucose: 92 mg/dL (ref 70–99)
Potassium: 4.4 mmol/L (ref 3.5–5.2)
Sodium: 147 mmol/L — ABNORMAL HIGH (ref 134–144)
eGFR: 56 mL/min/{1.73_m2} — ABNORMAL LOW (ref >59)

## 2023-02-04 LAB — IRON,TIBC AND FERRITIN PANEL
Ferritin: 80 ng/mL (ref 15–150)
Iron Saturation: 13 % — ABNORMAL LOW (ref 15–55)
Iron: 42 ug/dL (ref 27–139)
Total Iron Binding Capacity: 312 ug/dL (ref 250–450)
UIBC: 270 ug/dL (ref 118–369)

## 2023-03-08 ENCOUNTER — Encounter: Payer: Self-pay | Admitting: Emergency Medicine

## 2023-03-08 ENCOUNTER — Other Ambulatory Visit: Payer: Self-pay

## 2023-03-08 ENCOUNTER — Ambulatory Visit
Admission: EM | Admit: 2023-03-08 | Discharge: 2023-03-08 | Disposition: A | Discharge status: Home / Self Care | Attending: Family Medicine | Admitting: Family Medicine

## 2023-03-08 ENCOUNTER — Ambulatory Visit (INDEPENDENT_AMBULATORY_CARE_PROVIDER_SITE_OTHER)

## 2023-03-08 DIAGNOSIS — M25511 Pain in right shoulder: Secondary | ICD-10-CM

## 2023-03-08 DIAGNOSIS — R0609 Other forms of dyspnea: Secondary | ICD-10-CM

## 2023-03-08 DIAGNOSIS — M19011 Primary osteoarthritis, right shoulder: Secondary | ICD-10-CM | POA: Diagnosis not present

## 2023-03-08 NOTE — ED Triage Notes (Addendum)
 Pt reports right arm numbness and shoulder pain for last several months. Pt also reports intermittent shortness of breath for last several weeks. Denies any known injury. Reports history of shortness of breath but reports "episodes are happening more frequently."

## 2023-03-08 NOTE — Discharge Instructions (Signed)
 We should have your official x-ray reports and your lab results back tomorrow and we will give you a call if anything comes back abnormal.  Follow-up as soon as possible with your primary care provider for ongoing monitoring of both of these issues.  You may use Tylenol, muscle rubs and lidocaine patches for the shoulder pain

## 2023-03-08 NOTE — ED Notes (Signed)
 Denies either complaint at time of triage. Discussed with PA and reported pt stable to wait in waiting room for treatment room to become available. Pt and pt family aware.

## 2023-03-10 ENCOUNTER — Telehealth: Payer: Self-pay

## 2023-03-10 ENCOUNTER — Telehealth: Payer: Self-pay | Admitting: Emergency Medicine

## 2023-03-10 LAB — COMPREHENSIVE METABOLIC PANEL

## 2023-03-10 LAB — CBC WITH DIFFERENTIAL/PLATELET

## 2023-03-10 NOTE — Telephone Encounter (Signed)
 [facility] called and reported that blood specimen was "frozen" and samples that were obtained were unable to be completed. Per Lab, sample needs to be recollected. Attempted to reach out to pt using contact numbers in pt chart. No answer at either.

## 2023-03-10 NOTE — Telephone Encounter (Signed)
 Attempted to reach out to pt again. Family member answered and reported would tell pt to call [facility] when became available.

## 2023-03-10 NOTE — Telephone Encounter (Signed)
 Per previous nurse note, attempted to call pt to inform of labs being recollected. No response from patient provided numbers or her daughters' phone number.

## 2023-03-12 NOTE — ED Provider Notes (Signed)
 [facility]    CSN: [id number] Arrival date & time: 03/08/23  1658      History   Chief Complaint Chief Complaint  Patient presents with   Numbness    HPI Sandra Williams is a 89 y.o. female.   Patient presenting today with several month history of ongoing right arm numbness and tenderness at the shoulder.  States range of motion is intact but stiff and painful.  Denies any known injury, recent falls, swelling, discoloration.  Also having intermittent episodes of shortness of breath that have been ongoing for months but seem to be happening more frequently recently.  She states she tends to get a bit more out of breath with day-to-day tasks.  Denies palpitations, chest pain, dizziness, severe headaches, visual changes, mental status changes, cough, fevers.  Past medical history significant for hypertension, hyperlipidemia, history of GI cancer, arthritis.  So far not trying anything over-the-counter for the symptoms.    Past Medical History:  Diagnosis Date   Arthritis    ARTHRITIS 10/07/2006   Qualifier: Diagnosis of  By: Erby Pian MD, Cornelius     Cancer [facility])    cecal carcinoma   Chest pain 10/22/2012   Colon cancer (HCC) 02/01/2012   Stage I (T2, N0, M0) cancer the cecum status post resection by Dr. Lovell Sheehan with 14 lymph nodes found all of which were negative. She had no evidence for metastatic disease on CT scan of the abdomen and pelvis either. Her date of surgery was 10/22/2011.  Not in need of chemotherapy for her stage I cancer.    GERD (gastroesophageal reflux disease)    HTN (hypertension)    Hyperlipidemia    Iron deficiency anemia 02/01/2012   At time of colon cancer presentation   Knee joint pain 2014   right   Lipoma of right forearm s/p excision 07/06/2018    OA (osteoarthritis) of knee 06/27/2012   Osteoporosis    Rotator cuff tear arthropathy 12/11/2013   Small bowel obstruction (HCC) 09/14/2017    Patient Active Problem List   Diagnosis Date  Noted   Mucocele of mouth 08/03/2022   Leg swelling 08/03/2022   Dizziness 02/18/2022   Bronchitis 12/31/2021   Bilateral leg edema 09/17/2021   Chronic fatigue 05/01/2021   Nodule of skin of right foot 07/29/2020   Low back pain 07/03/2020   Carpal tunnel syndrome of right wrist 11/06/2019   Need for immunization against influenza 11/06/2019   Osteoporosis of multiple sites 09/27/2019   Osteoarthritis    Primary osteoarthritis of right knee 10/03/2013   GERD (gastroesophageal reflux disease) 10/23/2012   Spinal stenosis of lumbar region 03/01/2008   HYPERTENSION, BENIGN ESSENTIAL 01/19/2008   HLD (hyperlipidemia) 10/07/2006   OVERACTIVE BLADDER 10/07/2006    Past Surgical History:  Procedure Laterality Date   ABDOMINAL HYSTERECTOMY     partial   APPENDECTOMY     [facility]   CATARACT EXTRACTION Bilateral    CESAREAN SECTION     COLON SURGERY     dental implant     LIPOMA EXCISION Right 07/06/2018   Procedure: EXCISION LIPOMA RIGHT FOREARM;  Surgeon: Vickki Hearing, MD;  Location: [facility];  Service: Orthopedics;  Laterality: Right;   PARTIAL COLECTOMY  10/22/2011   Procedure: PARTIAL COLECTOMY;  Surgeon: Dalia Heading, MD;  Location: [facility];  Service: General;  Laterality: N/A;   TOTAL KNEE ARTHROPLASTY Right 01/30/2014   Procedure: TOTAL KNEE ARTHROPLASTY;  Surgeon: Vickki Hearing, MD;  Location: [facility];  Service: Orthopedics;  Laterality: Right;    OB History   No obstetric history on file.      Home Medications    Prior to Admission medications   Medication Sig Start Date End Date Taking? Authorizing Provider  acetaminophen (TYLENOL) 500 MG tablet Take 500 mg by mouth every 6 (six) hours as needed.    [provider]  amLODipine (NORVASC) 10 MG tablet Take 0.5 tablets (5 mg total) by mouth daily. 08/04/22   Anabel Halon, MD  amoxicillin-clavulanate (AUGMENTIN) 875-125 MG tablet Take 1 tablet by mouth every 12 (twelve) hours. Patient not taking:  Reported on 02/03/2023 09/27/22   Leath-Warren, Sadie Haber, NP  Ascorbic Acid (VITAMIN C ADULT GUMMIES PO) Take by mouth.    [provider]  aspirin 81 MG EC tablet Take 81 mg by mouth daily.     [provider]  gabapentin (NEURONTIN) 100 MG capsule Take 1 capsule (100 mg total) by mouth 3 (three) times daily as needed (pain). 06/18/22 09/16/22  Billie Lade, MD    Family History Family History  Problem Relation Age of Onset   Hypertension Mother    Cancer Sister    Breast cancer Daughter    Hypertension Daughter    Colon cancer Neg Hx    Liver disease Neg Hx     Social History Social History   Tobacco Use   Smoking status: Never   Smokeless tobacco: Never  Vaping Use   Vaping status: Never Used  Substance Use Topics   Alcohol use: No   Drug use: No     Allergies   Aspirin   Review of Systems Review of Systems Per HPI  Physical Exam Triage Vital Signs ED Triage Vitals [03/08/23 1703]  Encounter Vitals Group     BP 118/68     Systolic BP Percentile      Diastolic BP Percentile      Pulse Rate 92     Resp 20     Temp 97.8 F (36.6 C)     Temp Source Oral     SpO2 97 %     Weight      Height      Head Circumference      Peak Flow      Pain Score 0     Pain Loc      Pain Education      Exclude from Growth Chart    No data found.  Updated Vital Signs BP 118/68 (BP Location: Right Arm)   Pulse 92   Temp 97.8 F (36.6 C) (Oral)   Resp 20   SpO2 97%   Visual Acuity Right Eye Distance:   Left Eye Distance:   Bilateral Distance:    Right Eye Near:   Left Eye Near:    Bilateral Near:     Physical Exam Vitals and nursing note reviewed.  Constitutional:      Appearance: Normal appearance. She is not ill-appearing.  HENT:     Head: Atraumatic.     Mouth/Throat:     Mouth: Mucous membranes are moist.  Eyes:     Extraocular Movements: Extraocular movements intact.     Conjunctiva/sclera: Conjunctivae normal.   Cardiovascular:     Rate and Rhythm: Normal rate and regular rhythm.     Heart sounds: Normal heart sounds.  Pulmonary:     Effort: Pulmonary effort is normal.     Breath sounds: Normal breath sounds. No wheezing or rales.  Musculoskeletal:        General: Swelling present. Normal range of motion.     Cervical back: Normal range of motion and neck supple.     Comments: 1+ edema bilateral lower legs symmetrically, baseline per patient Right shoulder mildly diffusely tender to palpation with no point tenderness, deformities palpable.  Range of motion appears intact to her baseline per patient.  Grip strength full and equal bilateral hands  Skin:    General: Skin is warm and dry.  Neurological:     Mental Status: She is alert and oriented to person, place, and time.     Comments: Bilateral lower extremities neurovascularly intact  Psychiatric:        Mood and Affect: Mood normal.        Thought Content: Thought content normal.        Judgment: Judgment normal.      UC Treatments / Results  Labs (all labs ordered are listed, but only abnormal results are displayed) Labs Reviewed  COMPREHENSIVE METABOLIC PANEL   Narrative:    Performed at:  [address] [facility] [address], [place], [place]  [id number] Lab Director: Jolene Schimke MD, Phone:  [phone]  CBC WITH DIFFERENTIAL/PLATELET   Narrative:    Performed at:  [address] [facility] [address] [place], [place]  [id number] Lab Director: Jolene Schimke MD, Phone:  [phone]    EKG   Radiology No results found.  Procedures Procedures (including critical care time)  Medications Ordered in UC Medications - No data to display  Initial Impression / Assessment and Plan / UC Course  I have reviewed the triage vital signs and the nursing notes.  Pertinent labs & imaging results that were available during my care of the patient were reviewed by me and considered in my medical decision making (see chart for  details).     Vital signs within normal limits today and she is very well-appearing and in no acute distress, speaking in full sentences.  X-ray of the right shoulder revealing chronic degenerative changes, no acute abnormalities.  Discussed Voltaren gel, Tylenol, muscle rubs and PCP follow-up for this.  Regarding her dyspnea on exertion, do recommend further follow-up with PCP and possibly cardiology.  Chest x-ray negative for acute abnormalities today, labs pending to rule out organic causes such as anemia, electrolyte abnormalities.  Unfortunately unable to perform a BNP in the setting but discussed that this could be further worked up in the outpatient setting.  Patient declines EKG today.  Discussed ED if worsening at any point.  Final Clinical Impressions(s) / UC Diagnoses   Final diagnoses:  DOE (dyspnea on exertion)  Acute pain of right shoulder     Discharge Instructions      We should have your official x-ray reports and your lab results back tomorrow and we will give you a call if anything comes back abnormal.  Follow-up as soon as possible with your primary care provider for ongoing monitoring of both of these issues.  You may use Tylenol, muscle rubs and lidocaine patches for the shoulder pain    ED Prescriptions   None    PDMP not reviewed this encounter.   Roosvelt Maser [place], [place] 03/12/23 [phone]

## 2023-03-21 ENCOUNTER — Ambulatory Visit (INDEPENDENT_AMBULATORY_CARE_PROVIDER_SITE_OTHER): Payer: Medicare Other | Admitting: Internal Medicine

## 2023-03-21 ENCOUNTER — Encounter: Payer: Self-pay | Admitting: Internal Medicine

## 2023-03-21 VITALS — BP 130/72 | HR 104 | Ht 60.0 in | Wt 129.8 lb

## 2023-03-21 DIAGNOSIS — E559 Vitamin D deficiency, unspecified: Secondary | ICD-10-CM | POA: Diagnosis not present

## 2023-03-21 DIAGNOSIS — F419 Anxiety disorder, unspecified: Secondary | ICD-10-CM

## 2023-03-21 DIAGNOSIS — R5382 Chronic fatigue, unspecified: Secondary | ICD-10-CM

## 2023-03-21 DIAGNOSIS — M19011 Primary osteoarthritis, right shoulder: Secondary | ICD-10-CM | POA: Diagnosis not present

## 2023-03-21 DIAGNOSIS — K219 Gastro-esophageal reflux disease without esophagitis: Secondary | ICD-10-CM

## 2023-03-21 DIAGNOSIS — G5601 Carpal tunnel syndrome, right upper limb: Secondary | ICD-10-CM | POA: Diagnosis not present

## 2023-03-21 MED ORDER — HYDROXYZINE HCL 10 MG PO TABS: 10.0000 mg | ORAL_TABLET | Freq: Three times a day (TID) | ORAL | 0 refills | Status: AC | PRN

## 2023-03-21 NOTE — Patient Instructions (Signed)
 It was a pleasure to see you today.  Thank you for giving [place] the opportunity to be involved in your care.  Below is a brief recap of your visit and next steps.  We will plan to see you again in July.  Summary No medication changes today Will refer to orthopedic surgery for right shoulder and wrist discomfort Update labs today given recent symptoms Recommend continuing current supportive care measure for upper respiratory infection

## 2023-03-21 NOTE — Progress Notes (Signed)
 Acute Office Visit  Subjective:     Patient ID: Sandra Williams, female    DOB: 28-Nov-1925, 89 y.o.   MRN: [id number]  Chief Complaint  Patient presents with   Care Management    Pts daughter states she got a cold for about 1 week now, took her to urgent care for right arm, pt states her hand is numb constantly.    [name] presents today for an acute visit with multiple concerns to discuss.  She endorses a 1 week history of fatigue and anxiety.  She denies shortness of breath with exertion.  She endorses acute on chronic discomfort in her right wrist as well as her right shoulder.  She has a known history of glenohumeral arthritis in the right shoulder.  Symptoms have worsened recently.  She describes constant numbness in her right hand.  Review of Systems  Constitutional:  Positive for malaise/fatigue.  HENT:  Positive for congestion.   Musculoskeletal:  Positive for joint pain (right shoulder, right wrist).  Psychiatric/Behavioral:  The patient is nervous/anxious.       Objective:    BP 130/72 (BP Location: Right Arm, Patient Position: Sitting, Cuff Size: Normal)   Pulse (!) 104   Ht 5' (1.524 m)   Wt 129 lb 12.8 oz (58.9 kg)   SpO2 (!) 89%   BMI 25.35 kg/m   Physical Exam Constitutional:      General: She is not in acute distress.    Appearance: Normal appearance. She is not toxic-appearing.  HENT:     Head: Normocephalic and atraumatic.     Right Ear: External ear normal.     Left Ear: External ear normal.     Nose: Nose normal. No congestion or rhinorrhea.     Mouth/Throat:     Mouth: Mucous membranes are moist.     Pharynx: Oropharynx is clear. No oropharyngeal exudate or posterior oropharyngeal erythema.  Eyes:     General: No scleral icterus.    Extraocular Movements: Extraocular movements intact.     Conjunctiva/sclera: Conjunctivae normal.     Pupils: Pupils are equal, round, and reactive to light.  Cardiovascular:     Rate and Rhythm: Normal rate  and regular rhythm.     Pulses: Normal pulses.     Heart sounds: Normal heart sounds. No murmur heard.    No friction rub. No gallop.  Pulmonary:     Effort: Pulmonary effort is normal.     Breath sounds: Normal breath sounds. No wheezing, rhonchi or rales.  Abdominal:     General: Abdomen is flat. Bowel sounds are normal. There is no distension.     Palpations: Abdomen is soft.     Tenderness: There is no abdominal tenderness.  Musculoskeletal:        General: No swelling.     Cervical back: Normal range of motion.     Right lower leg: No edema.     Left lower leg: No edema.     Comments: Limited ROM of the right shoulder secondary to pain.  Positive Tinel's in the right wrist  Lymphadenopathy:     Cervical: No cervical adenopathy.  Skin:    General: Skin is warm and dry.     Capillary Refill: Capillary refill takes less than 2 seconds.     Coloration: Skin is not jaundiced.  Neurological:     General: No focal deficit present.     Mental Status: She is alert and oriented to person, place, and time.  Psychiatric:        Mood and Affect: Mood normal.        Behavior: Behavior normal.       Assessment & Plan:   Problem List Items Addressed This Visit       Carpal tunnel syndrome of right wrist   Known history of carpal tunnel syndrome of the right wrist.  Today she endorses constant numbness in the right hand.  Previously treated conservatively with a wrist brace and gabapentin .  Will refer to orthopedic surgery for further evaluation and management.  In the interim, I have recommended continued use of a cock-up wrist brace.      Glenohumeral arthritis, right   She endorses acute on chronic right shoulder pain today.  Known history of glenohumeral arthritis.  ROM of the right shoulder is limited secondary to pain.  Will refer to orthopedic surgery for further evaluation and management.      Chronic fatigue   Acute on chronic issue that has worsened in recent weeks.  Will  update basic labs to rule out any contributing metabolic etiologies.      Anxiety   She endorses a recent history of anxiety and is interested in an as needed medication for anxiety relief.  Will add hydroxyzine  for as needed use.      Meds ordered this encounter  Medications   hydrOXYzine  (ATARAX ) 10 MG tablet    Sig: Take 1 tablet (10 mg total) by mouth 3 (three) times daily as needed.    Dispense:  30 tablet    Refill:  0   Return if symptoms worsen or fail to improve.  Tobi Fortes, MD

## 2023-03-23 ENCOUNTER — Other Ambulatory Visit: Payer: Self-pay | Admitting: Internal Medicine

## 2023-03-23 ENCOUNTER — Ambulatory Visit: Payer: Self-pay

## 2023-03-23 DIAGNOSIS — U071 COVID-19: Secondary | ICD-10-CM

## 2023-03-23 MED ORDER — NIRMATRELVIR/RITONAVIR (PAXLOVID) TABLET (RENAL DOSING): 2.0000 | ORAL_TABLET | Freq: Two times a day (BID) | ORAL | 0 refills | Status: AC

## 2023-03-23 NOTE — Telephone Encounter (Addendum)
 Summary: Covid positive   Copied From CRM [id number]. Reason for Triage: Pt has covid and is requesting a Rx to treat her symptoms.  She is miserable, coughing, feverish. Tested positive on home test.  Best contact: [phone]      Chief Complaint: COVID Symptoms: cough w/ congestion- clear mucous, body aches, fever, weakness Frequency: COVID test positive on 03/21/2023 Pertinent Negatives: Patient denies loss of taste or appetite, no SOB Disposition: [] ED /[] Urgent Care (no appt availability in office) / [] Appointment(In office/virtual)/ []  [place] [facility]/ [] Home Care/ [x] Refused Recommended Disposition /[] [place] [facility]/ []  Follow-up with PCP Additional Notes: pt stated her entire family is sick with COVID therefore she has no transportation to come into office to been seen by PCP: appt offered: pt declined.  Would like to see if PCP can call in a Rx to help with s/s of COVID. Pt stated overall feeling really sick, tired, weak and needs something to help

## 2023-03-23 NOTE — Telephone Encounter (Signed)
 Reason for Disposition . [1] HIGH RISK patient (e.g., weak immune system, age > 64 years, obesity with BMI 30 or higher, pregnant, chronic lung disease or other chronic medical condition) AND [2] COVID symptoms (e.g., cough, fever)  (Exceptions: Already seen by PCP and no new or worsening symptoms.)  Answer Assessment - Initial Assessment Questions 1. COVID-19 DIAGNOSIS: "How do you know that you have COVID?" (e.g., positive lab test or self-test, diagnosed by doctor or NP/PA, symptoms after exposure).     Took COVID test at home 2. COVID-19 EXPOSURE: "Was there any known exposure to COVID before the symptoms began?" CDC Definition of close contact: within 6 feet (2 meters) for a total of 15 minutes or more over a 24-hour period.  Yes, sister and daughter have COVID 3. ONSET: "When did the COVID-19 symptoms start?"      03/21/2023 4. WORST SYMPTOM: "What is your worst symptom?" (e.g., cough, fever, shortness of breath, muscle aches)     Cough - clear mucous, body aches, fever, weak  5. COUGH: "Do you have a cough?" If Yes, ask: "How bad is the cough?"       Yes  6. FEVER: "Do you have a fever?" If Yes, ask: "What is your temperature, how was it measured, and when did it start?"     Unable  7. RESPIRATORY STATUS: "Describe your breathing?" (e.g., normal; shortness of breath, wheezing, unable to speak)      no 8. BETTER-SAME-WORSE: "Are you getting better, staying the same or getting worse compared to yesterday?"  If getting worse, ask, "In what way?"     worse 9. OTHER SYMPTOMS: "Do you have any other symptoms?"  (e.g., chills, fatigue, headache, loss of smell or taste, muscle pain, sore throat)     fatigue 10. HIGH RISK DISEASE: "Do you have any chronic medical problems?" (e.g., asthma, heart or lung disease, weak immune system, obesity, etc.)       no 11. VACCINE: "Have you had the COVID-19 vaccine?" If Yes, ask: "Which one, how many shots, when did you get it?"       yes 12. PREGNANCY: "Is  there any chance you are pregnant?" "When was your last menstrual period?"       N/a 13. O2 SATURATION MONITOR:  "Do you use an oxygen saturation monitor (pulse oximeter) at home?" If Yes, ask "What is your reading (oxygen level) today?" "What is your usual oxygen saturation reading?" (e.g., 95%)       N/a  Protocols used: Coronavirus (COVID-19) Diagnosed or Suspected-A-AH

## 2023-03-24 ENCOUNTER — Ambulatory Visit: Payer: Self-pay | Admitting: Internal Medicine

## 2023-03-24 LAB — TSH+FREE T4
Free T4: 1.11 ng/dL (ref 0.82–1.77)
TSH: 0.775 u[IU]/mL (ref 0.450–4.500)

## 2023-03-24 LAB — VITAMIN D 25 HYDROXY (VIT D DEFICIENCY, FRACTURES): Vit D, 25-Hydroxy: 20.5 ng/mL — ABNORMAL LOW (ref 30.0–100.0)

## 2023-03-24 LAB — CBC WITH DIFFERENTIAL/PLATELET
Basophils Absolute: 0 10*3/uL (ref 0.0–0.2)
Basos: 1 %
EOS (ABSOLUTE): 0 10*3/uL (ref 0.0–0.4)
Eos: 0 %
Hematocrit: 41.9 % (ref 34.0–46.6)
Hemoglobin: 13.3 g/dL (ref 11.1–15.9)
Immature Grans (Abs): 0 10*3/uL (ref 0.0–0.1)
Immature Granulocytes: 0 %
Lymphocytes Absolute: 1.3 10*3/uL (ref 0.7–3.1)
Lymphs: 27 %
MCH: 27.6 pg (ref 26.6–33.0)
MCHC: 31.7 g/dL (ref 31.5–35.7)
MCV: 87 fL (ref 79–97)
Monocytes Absolute: 1 10*3/uL — ABNORMAL HIGH (ref 0.1–0.9)
Monocytes: 20 %
Neutrophils Absolute: 2.5 10*3/uL (ref 1.4–7.0)
Neutrophils: 52 %
Platelets: 256 10*3/uL (ref 150–450)
RBC: 4.82 x10E6/uL (ref 3.77–5.28)
RDW: 13.9 % (ref 11.7–15.4)
WBC: 4.8 10*3/uL (ref 3.4–10.8)

## 2023-03-24 LAB — IRON,TIBC AND FERRITIN PANEL
Ferritin: 180 ng/mL — ABNORMAL HIGH (ref 15–150)
Iron Saturation: 6 % — CL (ref 15–55)
Iron: 17 ug/dL — ABNORMAL LOW (ref 27–139)
Total Iron Binding Capacity: 300 ug/dL (ref 250–450)
UIBC: 283 ug/dL (ref 118–369)

## 2023-03-24 LAB — CMP14+EGFR
ALT: 20 IU/L (ref 0–32)
AST: 46 IU/L — ABNORMAL HIGH (ref 0–40)
Albumin: 4.3 g/dL (ref 3.6–4.6)
Alkaline Phosphatase: 78 IU/L (ref 44–121)
BUN/Creatinine Ratio: 21 (ref 12–28)
BUN: 22 mg/dL (ref 10–36)
Bilirubin Total: <0.2 mg/dL (ref 0.0–1.2)
CO2: 19 mmol/L — ABNORMAL LOW (ref 20–29)
Calcium: 9.4 mg/dL (ref 8.7–10.3)
Chloride: 101 mmol/L (ref 96–106)
Creatinine, Ser: 1.04 mg/dL — ABNORMAL HIGH (ref 0.57–1.00)
Globulin, Total: 3.5 g/dL (ref 1.5–4.5)
Glucose: 100 mg/dL — ABNORMAL HIGH (ref 70–99)
Potassium: 4.8 mmol/L (ref 3.5–5.2)
Sodium: 140 mmol/L (ref 134–144)
Total Protein: 7.8 g/dL (ref 6.0–8.5)
eGFR: 49 mL/min/{1.73_m2} — ABNORMAL LOW (ref >59)

## 2023-03-24 LAB — B12 AND FOLATE PANEL: Vitamin B-12: 664 pg/mL (ref 232–1245)

## 2023-03-24 NOTE — Telephone Encounter (Signed)
 Patient advised.

## 2023-03-31 ENCOUNTER — Other Ambulatory Visit: Payer: Self-pay

## 2023-03-31 ENCOUNTER — Encounter (HOSPITAL_COMMUNITY): Payer: Self-pay | Admitting: *Deleted

## 2023-03-31 ENCOUNTER — Emergency Department (HOSPITAL_COMMUNITY): Admission: EM | Admit: 2023-03-31 | Discharge: 2023-03-31 | Disposition: A | Discharge status: Home / Self Care

## 2023-03-31 ENCOUNTER — Emergency Department (HOSPITAL_COMMUNITY)

## 2023-03-31 DIAGNOSIS — R531 Weakness: Secondary | ICD-10-CM | POA: Insufficient documentation

## 2023-03-31 DIAGNOSIS — G319 Degenerative disease of nervous system, unspecified: Secondary | ICD-10-CM | POA: Diagnosis not present

## 2023-03-31 DIAGNOSIS — R42 Dizziness and giddiness: Secondary | ICD-10-CM | POA: Diagnosis not present

## 2023-03-31 DIAGNOSIS — Z7982 Long term (current) use of aspirin: Secondary | ICD-10-CM | POA: Diagnosis not present

## 2023-03-31 DIAGNOSIS — Z743 Need for continuous supervision: Secondary | ICD-10-CM | POA: Diagnosis not present

## 2023-03-31 DIAGNOSIS — R29818 Other symptoms and signs involving the nervous system: Secondary | ICD-10-CM | POA: Diagnosis not present

## 2023-03-31 DIAGNOSIS — R6889 Other general symptoms and signs: Secondary | ICD-10-CM | POA: Diagnosis not present

## 2023-03-31 DIAGNOSIS — I6782 Cerebral ischemia: Secondary | ICD-10-CM | POA: Diagnosis not present

## 2023-03-31 DIAGNOSIS — R404 Transient alteration of awareness: Secondary | ICD-10-CM | POA: Diagnosis not present

## 2023-03-31 DIAGNOSIS — I499 Cardiac arrhythmia, unspecified: Secondary | ICD-10-CM | POA: Diagnosis not present

## 2023-03-31 LAB — CBC WITH DIFFERENTIAL/PLATELET
Abs Immature Granulocytes: 0.01 10*3/uL (ref 0.00–0.07)
Basophils Absolute: 0 10*3/uL (ref 0.0–0.1)
Basophils Relative: 1 %
Eosinophils Absolute: 0.1 10*3/uL (ref 0.0–0.5)
Eosinophils Relative: 2 %
HCT: 40.5 % (ref 36.0–46.0)
Hemoglobin: 12.6 g/dL (ref 12.0–15.0)
Immature Granulocytes: 0 %
Lymphocytes Relative: 37 %
Lymphs Abs: 1.5 10*3/uL (ref 0.7–4.0)
MCH: 27.3 pg (ref 26.0–34.0)
MCHC: 31.1 g/dL (ref 30.0–36.0)
MCV: 87.9 fL (ref 80.0–100.0)
Monocytes Absolute: 0.5 10*3/uL (ref 0.1–1.0)
Monocytes Relative: 12 %
Neutro Abs: 1.9 10*3/uL (ref 1.7–7.7)
Neutrophils Relative %: 48 %
Platelets: 415 10*3/uL — ABNORMAL HIGH (ref 150–400)
RBC: 4.61 MIL/uL (ref 3.87–5.11)
RDW: 14.4 % (ref 11.5–15.5)
WBC: 4 10*3/uL (ref 4.0–10.5)
nRBC: 0 % (ref 0.0–0.2)

## 2023-03-31 LAB — URINALYSIS, ROUTINE W REFLEX MICROSCOPIC
Bilirubin Urine: NEGATIVE
Glucose, UA: NEGATIVE mg/dL
Hgb urine dipstick: NEGATIVE
Ketones, ur: NEGATIVE mg/dL
Leukocytes,Ua: NEGATIVE
Nitrite: NEGATIVE
Protein, ur: NEGATIVE mg/dL
Specific Gravity, Urine: 1.011 (ref 1.005–1.030)
pH: 5 (ref 5.0–8.0)

## 2023-03-31 LAB — COMPREHENSIVE METABOLIC PANEL WITH GFR
ALT: 45 U/L — ABNORMAL HIGH (ref 0–44)
AST: 49 U/L — ABNORMAL HIGH (ref 15–41)
Albumin: 3.6 g/dL (ref 3.5–5.0)
Alkaline Phosphatase: 47 U/L (ref 38–126)
Anion gap: 11 (ref 5–15)
BUN: 21 mg/dL (ref 8–23)
CO2: 25 mmol/L (ref 22–32)
Calcium: 9.7 mg/dL (ref 8.9–10.3)
Chloride: 105 mmol/L (ref 98–111)
Creatinine, Ser: 0.99 mg/dL (ref 0.44–1.00)
GFR, Estimated: 52 mL/min — ABNORMAL LOW (ref >60)
Glucose, Bld: 112 mg/dL — ABNORMAL HIGH (ref 70–99)
Potassium: 4.2 mmol/L (ref 3.5–5.1)
Sodium: 141 mmol/L (ref 135–145)
Total Bilirubin: 0.4 mg/dL (ref 0.0–1.2)
Total Protein: 7.8 g/dL (ref 6.5–8.1)

## 2023-03-31 LAB — TROPONIN I (HIGH SENSITIVITY)
Troponin I (High Sensitivity): 29 ng/L — ABNORMAL HIGH (ref <18)
Troponin I (High Sensitivity): 24 ng/L — ABNORMAL HIGH (ref <18)

## 2023-03-31 LAB — LACTIC ACID, PLASMA
Lactic Acid, Venous: 2.1 mmol/L (ref 0.5–1.9)
Lactic Acid, Venous: 1.6 mmol/L (ref 0.5–1.9)

## 2023-03-31 MED ORDER — SODIUM CHLORIDE 0.9 % IV BOLUS
500.0000 mL | Freq: Once | INTRAVENOUS | Status: AC
  Administered 2023-03-31: 500 mL via INTRAVENOUS

## 2023-03-31 NOTE — Discharge Instructions (Signed)
 Please follow-up closely with primary care doctor on an outpatient basis.  Return to the emergency department immediately for any new or worsening symptoms.

## 2023-03-31 NOTE — ED Provider Notes (Signed)
 [place] EMERGENCY DEPARTMENT AT [facility] Provider Note   CSN: [id number] Arrival date & time: 03/31/23  1101     History  Chief Complaint  Patient presents with   Dizziness    Sandra Williams is a 89 y.o. female.  Patient is a 89 year old female who presents to the emergency department the chief complaint of generalized weakness and dizziness which has been ongoing since this morning upon awakening.  She notes that she did feel at her baseline last night.  She is accompanied by her son who notes that she has been having on and off symptoms for the past few weeks associated with generalized weakness.  Patient notes that she has had no recent falls or blunt head trauma.  She denies any associated chest pain, shortness of breath, abdominal pain, nausea, vomiting, diarrhea.  She has had no associated syncopal events.  She denies any abnormal headache at this point.   Dizziness      Home Medications Prior to Admission medications   Medication Sig Start Date End Date Taking? Authorizing Provider  acetaminophen (TYLENOL) 500 MG tablet Take 500 mg by mouth every 6 (six) hours as needed.    [provider]  amLODipine (NORVASC) 10 MG tablet Take 0.5 tablets (5 mg total) by mouth daily. 08/04/22   Anabel Halon, MD  amoxicillin-clavulanate (AUGMENTIN) 875-125 MG tablet Take 1 tablet by mouth every 12 (twelve) hours. Patient not taking: Reported on 02/03/2023 09/27/22   Leath-Warren, Sadie Haber, NP  Ascorbic Acid (VITAMIN C ADULT GUMMIES PO) Take by mouth.    [provider]  aspirin 81 MG EC tablet Take 81 mg by mouth daily.     [provider]  gabapentin (NEURONTIN) 100 MG capsule Take 1 capsule (100 mg total) by mouth 3 (three) times daily as needed (pain). 06/18/22 09/16/22  Billie Lade, MD  hydrOXYzine (ATARAX) 10 MG tablet Take 1 tablet (10 mg total) by mouth 3 (three) times daily as needed. 03/21/23   Billie Lade, MD       Allergies    Aspirin    Review of Systems   Review of Systems  Neurological:  Positive for dizziness.  All other systems reviewed and are negative.   Physical Exam Updated Vital Signs BP (!) 121/57   Pulse 66   Temp (!) 96 F (35.6 C) (Axillary) Comment (Src): unable to obtain oral temp  Resp 15   Ht 5' (1.524 m)   Wt 58.5 kg   SpO2 98%   BMI 25.19 kg/m  Physical Exam Vitals and nursing note reviewed.  Constitutional:      Appearance: Normal appearance.  HENT:     Head: Normocephalic and atraumatic.     Nose: Nose normal.     Mouth/Throat:     Mouth: Mucous membranes are moist.  Eyes:     Extraocular Movements: Extraocular movements intact.     Conjunctiva/sclera: Conjunctivae normal.     Pupils: Pupils are equal, round, and reactive to light.  Cardiovascular:     Rate and Rhythm: Normal rate and regular rhythm.     Pulses: Normal pulses.     Heart sounds: Normal heart sounds. No murmur heard.    No gallop.  Pulmonary:     Effort: Pulmonary effort is normal. No respiratory distress.     Breath sounds: Normal breath sounds. No stridor. No wheezing, rhonchi or rales.  Abdominal:     General: Abdomen is flat. Bowel sounds are normal.  There is no distension.     Palpations: Abdomen is soft. There is no mass.     Tenderness: There is no abdominal tenderness. There is no guarding.     Hernia: No hernia is present.  Musculoskeletal:        General: Normal range of motion.     Cervical back: Normal range of motion and neck supple.     Right lower leg: No edema.     Left lower leg: No edema.  Skin:    General: Skin is warm and dry.     Findings: No bruising or rash.  Neurological:     General: No focal deficit present.     Mental Status: She is alert and oriented to person, place, and time. Mental status is at baseline.     Cranial Nerves: No cranial nerve deficit.     Sensory: No sensory deficit.     Motor: No weakness.     Coordination: Coordination normal.   Psychiatric:        Mood and Affect: Mood normal.        Behavior: Behavior normal.        Thought Content: Thought content normal.        Judgment: Judgment normal.     ED Results / Procedures / Treatments   Labs (all labs ordered are listed, but only abnormal results are displayed) Labs Reviewed  COMPREHENSIVE METABOLIC PANEL WITH GFR - Abnormal; Notable for the following components:      Result Value   Glucose, Bld 112 (*)    AST 49 (*)    ALT 45 (*)    GFR, Estimated 52 (*)    All other components within normal limits  CBC WITH DIFFERENTIAL/PLATELET - Abnormal; Notable for the following components:   Platelets 415 (*)    All other components within normal limits  LACTIC ACID, PLASMA - Abnormal; Notable for the following components:   Lactic Acid, Venous 2.1 (*)    All other components within normal limits  TROPONIN I (HIGH SENSITIVITY) - Abnormal; Notable for the following components:   Troponin I (High Sensitivity) 29 (*)    All other components within normal limits  URINALYSIS, ROUTINE W REFLEX MICROSCOPIC  LACTIC ACID, PLASMA  TROPONIN I (HIGH SENSITIVITY)    EKG EKG Interpretation Date/Time:  Thursday March 31 2023 11:30:00 EDT Ventricular Rate:  67 PR Interval:  195 QRS Duration:  99 QT Interval:  423 QTC Calculation: 447 R Axis:   16  Text Interpretation: Sinus rhythm Abnormal R-wave progression, early transition Probable left ventricular hypertrophy Confirmed by Beckey Downing [phone]) on 03/31/2023 11:54:03 AM  Radiology CT Head Wo Contrast Result Date: 03/31/2023 CLINICAL DATA:  Neuro deficit, acute, stroke suspected EXAM: CT HEAD WITHOUT CONTRAST TECHNIQUE: Contiguous axial images were obtained from the base of the skull through the vertex without intravenous contrast. RADIATION DOSE REDUCTION: This exam was performed according to the departmental dose-optimization program which includes automated exposure control, adjustment of the mA and/or kV according to  patient size and/or use of iterative reconstruction technique. COMPARISON:  10/06/2014 FINDINGS: Brain: No evidence of acute infarction, hemorrhage, hydrocephalus, extra-axial collection or mass lesion/mass effect. Scattered low-density changes within the periventricular and subcortical white matter most compatible with chronic microvascular ischemic change. Mild diffuse cerebral volume loss. Vascular: Atherosclerotic calcifications involving the large vessels of the skull base. No unexpected hyperdense vessel. Skull: Normal. Negative for fracture or focal lesion. Sinuses/Orbits: No acute finding. Other: None. IMPRESSION: 1. No acute  intracranial findings. 2. Chronic microvascular ischemic change and cerebral volume loss. Electronically Signed   By: Duanne Guess D.O.   On: 03/31/2023 12:59   DG Chest Port 1 View Result Date: 03/31/2023 CLINICAL DATA:  Weakness EXAM: PORTABLE CHEST 1 VIEW COMPARISON:  03/08/2023 FINDINGS: The heart size and mediastinal contours are within normal limits. Both lungs are clear. The visualized skeletal structures are unremarkable. IMPRESSION: No active disease. Electronically Signed   By: Duanne Guess D.O.   On: 03/31/2023 12:57    Procedures Procedures    Medications Ordered in ED Medications  sodium chloride 0.9 % bolus 500 mL (500 mLs Intravenous New Bag/Given 03/31/23 1327)    ED Course/ Medical Decision Making/ A&P                                 Medical Decision Making Amount and/or Complexity of Data Reviewed Labs: ordered. Radiology: ordered.   This patient presents to the ED for concern of weakness, dizziness, lightheadedness differential diagnosis includes dehydration, electrolyte derangement, acute kidney injury, urinary tract infection, pneumonia, ACS, deconditioning   Additional history obtained:  Additional history obtained from daughter and son External records from outside source obtained and reviewed including medical  records   Lab Tests:  I Ordered, and personally interpreted labs.  The pertinent results include: Elevated troponin but flat, no electrolyte changes, normal kidney function, mildly elevated liver enzymes, normal bilirubin, no leukocytosis, no anemia, normal urinalysis   Imaging Studies ordered:  I ordered imaging studies including chest x-ray, CT scan of head, MRI brain I independently visualized and interpreted imaging which showed no acute cardiopulmonary process, no acute intracranial hemorrhage, no ischemic changes to the brain I agree with the radiologist interpretation   Medicines ordered and prescription drug management:  I ordered medication including IV fluids for hydration Reevaluation of the patient after these medicines showed that the patient improved I have reviewed the patients home medicines and have made adjustments as needed   Problem List / ED Course:  Patient is feeling much better at this time and is stable for discharge home.  Daughter is currently present at the bedside and notes that she would like to take the patient home with her.  She is in agreement for discharge at this time.  Blood work has been stable.  Do suspect that her initial lactic acidosis was secondary to dehydration.  MRI, CT scan and chest x-ray demonstrated no acute pathology.  Repeat troponins have been flat and patient has no acute ischemic changes on EKG.  Do not suspect ACS at this time.  Vital signs have remained stable with no tachycardia and no hypotension.  Patient does note that she feels better at this time after IV fluids.  Close follow-up with PCP was discussed as well as strict turn precautions for any new or worsening symptoms.  Patient and daughter voiced understand to the plan and had no additional questions.   Social Determinants of Health:  None           Final Clinical Impression(s) / ED Diagnoses Final diagnoses:  None    Rx / DC Orders ED Discharge Orders      None         Kathlen Mody 03/31/23 1622    Durwin Glaze, MD 04/01/23 [phone]

## 2023-03-31 NOTE — ED Notes (Signed)
 CRITICAL VALUE STICKER  CRITICAL VALUE: lactic acid 2.1  DATE & TIME NOTIFIED: 03/31/2023 1224  MD NOTIFIED: Dr. Rhae Hammock  TIME OF NOTIFICATION: 1224  RESPONSE:  n/a

## 2023-03-31 NOTE — ED Notes (Signed)
 Patient transported to CT

## 2023-03-31 NOTE — ED Notes (Signed)
 Pt transported to MRI

## 2023-03-31 NOTE — ED Notes (Signed)
pt returned from MRI

## 2023-03-31 NOTE — ED Triage Notes (Addendum)
 Pt BIB [facility] for dizziness and generalized weakness since waking up this morning, per report pt woke up at 0500 today, went to bed around 2200 last night and felt fine yesterday.  Pt reports that she has generalized weakness off and on, pt denies any pain. Pt reports with recent Covid about 2 weeks ago.

## 2023-03-31 NOTE — ED Notes (Signed)
 Left Ac and right hand IV attempted, able to draw blood unable to maintain IV

## 2023-04-04 ENCOUNTER — Telehealth: Payer: Self-pay | Admitting: Internal Medicine

## 2023-04-04 NOTE — Telephone Encounter (Signed)
Left vm approving orders.

## 2023-04-04 NOTE — Telephone Encounter (Signed)
 Copied from CRM [id number]. Topic: Clinical - Home Health Verbal Orders >> Apr 04, 2023  8:34 AM Louie Casa B wrote: Caller/Agency: [facility]  Callback Number: [phone] Service Requested: Physical Therapy Frequency: unknown Any new concerns about the patient? No

## 2023-04-29 ENCOUNTER — Encounter: Payer: Self-pay | Admitting: Internal Medicine

## 2023-04-29 DIAGNOSIS — M19011 Primary osteoarthritis, right shoulder: Secondary | ICD-10-CM | POA: Insufficient documentation

## 2023-04-29 DIAGNOSIS — F419 Anxiety disorder, unspecified: Secondary | ICD-10-CM | POA: Insufficient documentation

## 2023-04-29 NOTE — Assessment & Plan Note (Signed)
 Known history of carpal tunnel syndrome of the right wrist.  Today she endorses constant numbness in the right hand.  Previously treated conservatively with a wrist brace and gabapentin .  Will refer to orthopedic surgery for further evaluation and management.  In the interim, I have recommended continued use of a cock-up wrist brace.

## 2023-04-29 NOTE — Assessment & Plan Note (Signed)
 She endorses a recent history of anxiety and is interested in an as needed medication for anxiety relief.  Will add hydroxyzine  for as needed use.

## 2023-04-29 NOTE — Assessment & Plan Note (Signed)
 Acute on chronic issue that has worsened in recent weeks.  Will update basic labs to rule out any contributing metabolic etiologies.

## 2023-04-29 NOTE — Assessment & Plan Note (Signed)
 She endorses acute on chronic right shoulder pain today.  Known history of glenohumeral arthritis.  ROM of the right shoulder is limited secondary to pain.  Will refer to orthopedic surgery for further evaluation and management.

## 2023-06-02 ENCOUNTER — Encounter: Payer: Self-pay | Admitting: Internal Medicine

## 2023-06-02 ENCOUNTER — Ambulatory Visit: Payer: Self-pay

## 2023-06-02 ENCOUNTER — Telehealth: Payer: Self-pay | Admitting: Internal Medicine

## 2023-06-02 ENCOUNTER — Ambulatory Visit (INDEPENDENT_AMBULATORY_CARE_PROVIDER_SITE_OTHER): Admitting: Internal Medicine

## 2023-06-02 VITALS — Ht 60.0 in | Wt 128.2 lb

## 2023-06-02 DIAGNOSIS — R42 Dizziness and giddiness: Secondary | ICD-10-CM | POA: Diagnosis not present

## 2023-06-02 DIAGNOSIS — G5601 Carpal tunnel syndrome, right upper limb: Secondary | ICD-10-CM

## 2023-06-02 MED ORDER — MECLIZINE HCL 25 MG PO TABS: 25.0000 mg | ORAL_TABLET | Freq: Three times a day (TID) | ORAL | 3 refills | Status: AC | PRN

## 2023-06-02 NOTE — Patient Instructions (Signed)
 It was a pleasure to see you today.  Thank you for giving us  the opportunity to be involved in your care.  Below is a brief recap of your visit and next steps.  We will plan to see you again in July.  Summary I recommend resuming use of a wrist brace for your right wrist as previously discussed. Will try meclizine  for relief of dizziness Be sure to stay well-hydrated Follow up in late July / early August

## 2023-06-02 NOTE — Progress Notes (Signed)
 Acute Office Visit  Subjective:     Patient ID: Sandra Williams, female    DOB: [birth year], 89 y.o.   MRN: [id number]  Chief Complaint  Patient presents with   Dizziness    Off and on dizziness    Numbness    Numbness in hands    [name] presents today for an acute visit endorsing persistent dizziness and numbness in her right hand.  She endorses a nearly 8-month history of intermittent dizziness.  Symptoms typically occur when getting out of bed in the morning but also occur at random.  Of note, she presented to the emergency department 3/27 endorsing dizziness.  No acute pathology was identified on imaging.  Labs were largely reassuring.  Ultimately, her symptoms were attributed to dehydration and she was encouraged to maintain adequate fluid intake.  Symptoms have not worsened over the last 2 months but have not improved.  She denies associated nausea, chest pain, palpitations, and shortness of breath.  Her additional concern is numbness/tingling in her right hand.  She has a previously documented history of carpal tunnel syndrome of the right wrist.  Previously referred to orthopedic surgery but is not interested in procedural options.  I have previously recommended wearing a cock up wrist brace, which she has not been doing consistently.  Review of Systems  Neurological:  Positive for dizziness and tingling (Right hand/wrist).      Objective:    BP 132/76   Pulse 100   Ht 5' (1.524 m)   Wt 128 lb 3.2 oz (58.2 kg)   SpO2 96%   BMI 25.04 kg/m   Physical Exam Constitutional:      General: She is not in acute distress.    Appearance: Normal appearance. She is not toxic-appearing.  HENT:     Head: Normocephalic and atraumatic.     Right Ear: External ear normal.     Left Ear: External ear normal.     Nose: Nose normal. No congestion or rhinorrhea.     Mouth/Throat:     Mouth: Mucous membranes are moist.     Pharynx: Oropharynx is clear. No oropharyngeal exudate or  posterior oropharyngeal erythema.  Eyes:     General: No scleral icterus.    Extraocular Movements: Extraocular movements intact.     Conjunctiva/sclera: Conjunctivae normal.     Pupils: Pupils are equal, round, and reactive to light.  Cardiovascular:     Rate and Rhythm: Normal rate and regular rhythm.     Pulses: Normal pulses.     Heart sounds: Normal heart sounds. No murmur heard.    No friction rub. No gallop.  Pulmonary:     Effort: Pulmonary effort is normal.     Breath sounds: Normal breath sounds. No wheezing, rhonchi or rales.  Abdominal:     General: Abdomen is flat. Bowel sounds are normal. There is no distension.     Palpations: Abdomen is soft.     Tenderness: There is no abdominal tenderness.  Musculoskeletal:        General: No swelling.     Cervical back: Normal range of motion.     Right lower leg: No edema.     Left lower leg: No edema.     Comments: Positive Tinel's right wrist  Lymphadenopathy:     Cervical: No cervical adenopathy.  Skin:    General: Skin is warm and dry.     Capillary Refill: Capillary refill takes less than 2 seconds.  Coloration: Skin is not jaundiced.  Neurological:     General: No focal deficit present.     Mental Status: She is alert and oriented to person, place, and time.  Psychiatric:        Mood and Affect: Mood normal.        Behavior: Behavior normal.       Assessment & Plan:   Problem List Items Addressed This Visit       Carpal tunnel syndrome of right wrist   She again endorses numbness/tingling of the right wrist.  Known history of carpal tunnel syndrome of the right wrist.  Treated conservatively with a wrist brace and gabapentin , which she has not been wearing consistently.  I have previously placed referral to orthopedic surgery but she decided not to establish care because she is not interested in surgical or procedural intervention.  I again recommended consistent use of a cock up wrist brace.      Dizziness  - Primary   Return to care today for follow-up of dizziness.  This is a persistent issue since at least January 2025.  Orthostatic vitals have previously been negative.  ER presentation on 3/27 did not reveal any acute pathology on MRI, CT, or chest x-ray.  Her symptoms have largely been attributed to dehydration.  Orthostatics obtained in office today were negative, though there was a decrease in systolic blood pressure of 19 mmHg from lying to standing. -Given persistence of symptoms without clear etiology, we will trial meclizine  for as needed symptom relief.  She was encouraged to maintain adequate hydration.  She will return to care if symptoms worsen or fail to improve.      Meds ordered this encounter  Medications   meclizine  (ANTIVERT ) 25 MG tablet    Sig: Take 1 tablet (25 mg total) by mouth 3 (three) times daily as needed for dizziness.    Dispense:  30 tablet    Refill:  3    Return if symptoms worsen or fail to improve.  Tobi Fortes, MD

## 2023-06-02 NOTE — Assessment & Plan Note (Signed)
 Return to care today for follow-up of dizziness.  This is a persistent issue since at least January 2025.  Orthostatic vitals have previously been negative.  ER presentation on 3/27 did not reveal any acute pathology on MRI, CT, or chest x-ray.  Her symptoms have largely been attributed to dehydration.  Orthostatics obtained in office today were negative, though there was a decrease in systolic blood pressure of 19 mmHg from lying to standing. -Given persistence of symptoms without clear etiology, we will trial meclizine  for as needed symptom relief.  She was encouraged to maintain adequate hydration.  She will return to care if symptoms worsen or fail to improve.

## 2023-06-02 NOTE — Assessment & Plan Note (Signed)
 She again endorses numbness/tingling of the right wrist.  Known history of carpal tunnel syndrome of the right wrist.  Treated conservatively with a wrist brace and gabapentin , which she has not been wearing consistently.  I have previously placed referral to orthopedic surgery but she decided not to establish care because she is not interested in surgical or procedural intervention.  I again recommended consistent use of a cock up wrist brace.

## 2023-06-02 NOTE — Telephone Encounter (Signed)
 Copied from CRM [id number]. Topic: Clinical - Red Word Triage >> Jun 02, 2023  2:24 PM Crispin Dolphin wrote: Red Word that prompted transfer to Nurse Triage: Dizziness and out of medication   Chief Complaint: Dizziness Symptoms:  Frequency:  Pertinent Negatives: Patient denies  Disposition: [] ED /[] Urgent Care (no appt availability in office) / [] Appointment(In office/virtual)/ []  [facility]/ [] Home Care/ [] Refused Recommended Disposition /[] [facility]/ []  Follow-up with PCP Additional Notes: dizziness off/on for few days, out of BP meds, unsure of what BP is. During call, daughter Felipa Horsfall stated "do I really need to go through this just to make an appt". RN advised Ms. Felipa Horsfall, that the questions being asked are to triage and determine if a pcp appt is appropriate vs needing more immediate care. Felipa Horsfall states that she didntfeel like patient needs to go to the ED. Call transferred to CAL Answer Assessment - Initial Assessment Questions 1. DESCRIPTION: "Describe your dizziness."     Dizziness 2. VERTIGO: "Do you feel like either you or the room is spinning or tilting?"      *No Answer* 3. LIGHTHEADED: "Do you feel lightheaded?" (e.g., somewhat faint, woozy, weak upon standing)     *No Answer* 4. SEVERITY: "How bad is it?"  "Can you walk?"   - MILD: Feels slightly dizzy and unsteady, but is walking normally.   - MODERATE: Feels unsteady when walking, but not falling; interferes with normal activities (e.g., school, work).   - SEVERE: Unable to walk without falling, or requires assistance to walk without falling.     *No Answer* 5. ONSET:  "When did the dizziness begin?"     *No Answer* 6. AGGRAVATING FACTORS: "Does anything make it worse?" (e.g., standing, change in head position)     *No Answer* 7. CAUSE: "What do you think is causing the dizziness?"     Out of BP meds 8. RECURRENT SYMPTOM: "Have you had dizziness before?" If Yes, ask: "When was the last time?" "What  happened that time?"     *No Answer* 9. OTHER SYMPTOMS: "Do you have any other symptoms?" (e.g., headache, weakness, numbness, vomiting, earache)     *No Answer* 10. PREGNANCY: "Is there any chance you are pregnant?" "When was your last menstrual period?"       *No Answer*  Protocols used: Dizziness - Vertigo-A-AH

## 2023-06-02 NOTE — Telephone Encounter (Signed)
 Patient scheduled.

## 2023-06-02 NOTE — Telephone Encounter (Signed)
 error

## 2023-06-20 ENCOUNTER — Other Ambulatory Visit: Payer: Self-pay | Admitting: Internal Medicine

## 2023-06-20 DIAGNOSIS — I10 Essential (primary) hypertension: Secondary | ICD-10-CM

## 2023-06-20 MED ORDER — AMLODIPINE BESYLATE 10 MG PO TABS: 5.0000 mg | ORAL_TABLET | Freq: Every day | ORAL | Status: DC

## 2023-06-20 NOTE — Telephone Encounter (Signed)
 Copied from CRM [id number]. Topic: Clinical - Medication Refill >> Jun 20, 2023  1:54 PM Zipporah Him wrote: Medication: amLODipine  (NORVASC ) 10 MG tablet  Has the patient contacted their pharmacy? Yes   This is the patient's preferred pharmacy:  [facility] [address] [place] - [address] [place] [address] Phone: [phone] Fax: [phone]  Is this the correct pharmacy for this prescription? Yes If no, delete pharmacy and type the correct one.   Has the prescription been filled recently? Yes  Is the patient out of the medication? No, 2 left  Has the patient been seen for an appointment in the last year OR does the patient have an upcoming appointment? Yes  Can we respond through MyChart? Yes  Agent: Please be advised that Rx refills may take up to 3 business days. We ask that you follow-up with your pharmacy.

## 2023-06-22 ENCOUNTER — Other Ambulatory Visit: Payer: Self-pay | Admitting: Internal Medicine

## 2023-06-22 DIAGNOSIS — I10 Essential (primary) hypertension: Secondary | ICD-10-CM

## 2023-06-24 ENCOUNTER — Other Ambulatory Visit: Payer: Self-pay | Admitting: Internal Medicine

## 2023-06-24 DIAGNOSIS — I10 Essential (primary) hypertension: Secondary | ICD-10-CM

## 2023-07-01 ENCOUNTER — Other Ambulatory Visit: Payer: Self-pay

## 2023-07-01 ENCOUNTER — Telehealth: Payer: Self-pay

## 2023-07-01 DIAGNOSIS — I10 Essential (primary) hypertension: Secondary | ICD-10-CM

## 2023-07-01 MED ORDER — AMLODIPINE BESYLATE 10 MG PO TABS: 5.0000 mg | ORAL_TABLET | Freq: Every day | ORAL | 1 refills | Status: DC

## 2023-07-01 NOTE — Telephone Encounter (Signed)
 Amlodiopine sent to [facility]

## 2023-07-01 NOTE — Telephone Encounter (Signed)
 Patient has not yet received medications at her pharmacy. Daughter Heron [phone] asking for a call back if this can not be refilled today.   Prescription Request  07/01/2023  LOV: Visit date not found  What is the name of the medication or equipment? amLODipine  (NORVASC ) 10 MG tablet [[id number]]     Have you contacted your pharmacy to request a refill? Yes   Which pharmacy would you like this sent to?  [facility] [address] [place] - [address] [place] [address] Phone: [phone] Fax: [phone]    Patient notified that their request is being sent to the clinical staff for review and that they should receive a response within 2 business days.   Please advise at daughter walked into office

## 2023-07-01 NOTE — Telephone Encounter (Signed)
 Left message with sister to call back

## 2023-07-01 NOTE — Telephone Encounter (Signed)
 Called pt to left her know that Amlodiopine sent to [facility]

## 2023-08-03 ENCOUNTER — Ambulatory Visit

## 2023-08-04 ENCOUNTER — Ambulatory Visit: Payer: Medicare Other | Admitting: Internal Medicine

## 2023-08-09 ENCOUNTER — Ambulatory Visit (INDEPENDENT_AMBULATORY_CARE_PROVIDER_SITE_OTHER): Admitting: Internal Medicine

## 2023-08-09 ENCOUNTER — Encounter: Payer: Self-pay | Admitting: Internal Medicine

## 2023-08-09 VITALS — BP 138/64 | HR 94 | Ht 60.0 in | Wt 130.4 lb

## 2023-08-09 DIAGNOSIS — R739 Hyperglycemia, unspecified: Secondary | ICD-10-CM | POA: Diagnosis not present

## 2023-08-09 DIAGNOSIS — I10 Essential (primary) hypertension: Secondary | ICD-10-CM | POA: Diagnosis not present

## 2023-08-09 DIAGNOSIS — D229 Melanocytic nevi, unspecified: Secondary | ICD-10-CM

## 2023-08-09 DIAGNOSIS — L720 Epidermal cyst: Secondary | ICD-10-CM | POA: Insufficient documentation

## 2023-08-09 DIAGNOSIS — G5601 Carpal tunnel syndrome, right upper limb: Secondary | ICD-10-CM

## 2023-08-09 DIAGNOSIS — R600 Localized edema: Secondary | ICD-10-CM | POA: Diagnosis not present

## 2023-08-09 NOTE — Patient Instructions (Signed)
 Please continue to take medications as prescribed.  Please continue to follow low salt diet and ambulate as tolerated.  Please perform leg elevation and use compression socks as tolerated.

## 2023-08-09 NOTE — Assessment & Plan Note (Signed)
 BP Readings from Last 1 Encounters:  08/09/23 138/64   Well-controlled for her age with amlodipine  5 mg QD Counseled for compliance with the medications Advised DASH diet

## 2023-08-09 NOTE — Progress Notes (Unsigned)
 Established Patient Office Visit  Subjective:  Patient ID: Sandra Williams, female    DOB: 1925-12-12  Age: 89 y.o. MRN: [id number]  CC:  Chief Complaint  Patient presents with   Hypertension    Six month follow up    Leg Swelling    Swelling in both legs    Cyst    Knot on back of the neck     HPI Sandra Williams is a 89 y.o. female with past medical history of HTN, GERD, OA of multiple joints, lumbar spinal stenosis and GAD who presents for f/u of her chronic medical conditions.  HTN: Her BP was borderline elevated initially, but is overall considered controlled for her age.  She takes amlodipine  5 mg QD.  She denies any headache, dizziness, chest pain or palpitations.  She has chronic leg swelling.  She has tried leg elevation at times with some relief.  She also has compression socks, but has mild discomfort when she uses it.  Denies overt orthopnea or PND.  She has noticed a knot on her upper back area (lower cervical), which is chronic.  She denies any local pain or discharge currently.  Denies any recent change in size.  She also reports a chronic mole over her right foot area, which has not changed in size, shape or color for many years.    Past Medical History:  Diagnosis Date   Arthritis    ARTHRITIS 10/07/2006   Qualifier: Diagnosis of  By: Karren MD, Cornelius     Cancer [facility])    cecal carcinoma   Chest pain 10/22/2012   Colon cancer (HCC) 02/01/2012   Stage I (T2, N0, M0) cancer the cecum status post resection by Dr. Mavis with 14 lymph nodes found all of which were negative. She had no evidence for metastatic disease on CT scan of the abdomen and pelvis either. Her date of surgery was 10/22/2011.  Not in need of chemotherapy for her stage I cancer.    GERD (gastroesophageal reflux disease)    HTN (hypertension)    Hyperlipidemia    Iron deficiency anemia 02/01/2012   At time of colon cancer presentation   Knee joint pain 2014   right   Lipoma of  right forearm s/p excision 07/06/2018    OA (osteoarthritis) of knee 06/27/2012   Osteoporosis    Rotator cuff tear arthropathy 12/11/2013   Small bowel obstruction (HCC) 09/14/2017    Past Surgical History:  Procedure Laterality Date   ABDOMINAL HYSTERECTOMY     partial   APPENDECTOMY     [facility]   CATARACT EXTRACTION Bilateral    CESAREAN SECTION     COLON SURGERY     dental implant     LIPOMA EXCISION Right 07/06/2018   Procedure: EXCISION LIPOMA RIGHT FOREARM;  Surgeon: Margrette Taft BRAVO, MD;  Location: [facility];  Service: Orthopedics;  Laterality: Right;   PARTIAL COLECTOMY  10/22/2011   Procedure: PARTIAL COLECTOMY;  Surgeon: Oneil DELENA Mavis, MD;  Location: [facility];  Service: General;  Laterality: N/A;   TOTAL KNEE ARTHROPLASTY Right 01/30/2014   Procedure: TOTAL KNEE ARTHROPLASTY;  Surgeon: Taft BRAVO Margrette, MD;  Location: [facility];  Service: Orthopedics;  Laterality: Right;    Family History  Problem Relation Age of Onset   Hypertension Mother    Cancer Sister    Breast cancer Daughter    Hypertension Daughter    Colon cancer Neg Hx    Liver disease Neg Hx  Social History   Socioeconomic History   Marital status: Single    Spouse name: Not on file   Number of children: 3   Years of education: Not on file   Highest education level: Not on file  Occupational History   Not on file  Tobacco Use   Smoking status: Never   Smokeless tobacco: Never  Vaping Use   Vaping status: Never Used  Substance and Sexual Activity   Alcohol  use: No   Drug use: No   Sexual activity: Never    Birth control/protection: Surgical  Other Topics Concern   Not on file  Social History Narrative   Lives with daughter in [place], [place]       Enjoy: sits on the porch- people watching, use to like bingo, walks around house a lot      Diet: eats all food groups   Caffeine: coffee- 3-4 days a week, some tea, drinks coke 3 times a week    Water :  4-5 cups roughly      Wears seat belt   No  longer drives   Smoke detectors at home   No weapons she knows of    Social Drivers of Health   Financial Resource Strain: Medium Risk (02/09/2022)   Overall Financial Resource Strain (CARDIA)    Difficulty of Paying Living Expenses: Somewhat hard  Food Insecurity: No Food Insecurity (02/09/2022)   Hunger Vital Sign    Worried About Running Out of Food in the Last Year: Never true    Ran Out of Food in the Last Year: Never true  Recent Concern: Food Insecurity - Food Insecurity Present (01/28/2022)   Hunger Vital Sign    Worried About Running Out of Food in the Last Year: Sometimes true    Ran Out of Food in the Last Year: Sometimes true  Transportation Needs: No Transportation Needs (02/09/2022)   PRAPARE - Administrator, Civil Service (Medical): No    Lack of Transportation (Non-Medical): No  Recent Concern: Transportation Needs - Unmet Transportation Needs (01/28/2022)   PRAPARE - Transportation    Lack of Transportation (Medical): Yes    Lack of Transportation (Non-Medical): Yes  Physical Activity: Sufficiently Active (01/28/2022)   Exercise Vital Sign    Days of Exercise per Week: 7 days    Minutes of Exercise per Session: 30 min  Stress: Stress Concern Present (01/28/2022)   [employer] of Occupational Health - Occupational Stress Questionnaire    Feeling of Stress : To some extent  Social Connections: Socially Isolated (01/28/2022)   Social Connection and Isolation Panel    Frequency of Communication with Friends and Family: More than three times a week    Frequency of Social Gatherings with Friends and Family: More than three times a week    Attends Religious Services: Never    Database administrator or Organizations: No    Attends Banker Meetings: Never    Marital Status: Never married  Intimate Partner Violence: Not At Risk (01/28/2022)   Humiliation, Afraid, Rape, and Kick questionnaire    Fear of Current or Ex-Partner: No    Emotionally  Abused: No    Physically Abused: No    Sexually Abused: No    Outpatient Medications Prior to Visit  Medication Sig Dispense Refill   acetaminophen  (TYLENOL ) 500 MG tablet Take 500 mg by mouth every 6 (six) hours as needed.     amLODipine  (NORVASC ) 10 MG tablet Take 0.5 tablets (5  mg total) by mouth daily. 45 tablet 1   Ascorbic Acid (VITAMIN C ADULT GUMMIES PO) Take by mouth.     aspirin  81 MG EC tablet Take 81 mg by mouth daily.      hydrOXYzine  (ATARAX ) 10 MG tablet Take 1 tablet (10 mg total) by mouth 3 (three) times daily as needed. 30 tablet 0   meclizine  (ANTIVERT ) 25 MG tablet Take 1 tablet (25 mg total) by mouth 3 (three) times daily as needed for dizziness. 30 tablet 3   gabapentin  (NEURONTIN ) 100 MG capsule Take 1 capsule (100 mg total) by mouth 3 (three) times daily as needed (pain). 90 capsule 2   No facility-administered medications prior to visit.    Allergies  Allergen Reactions   Aspirin  Nausea And Vomiting    REACTION: Upset stomach if takes regular and uncoated if takes high dose  Asa 325    ROS Review of Systems  Constitutional:  Negative for chills and fever.  HENT:  Negative for congestion, sore throat and trouble swallowing.   Eyes:  Negative for pain and discharge.  Respiratory:  Negative for cough and shortness of breath.   Cardiovascular:  Positive for leg swelling. Negative for chest pain and palpitations.  Gastrointestinal:  Negative for abdominal pain, diarrhea, nausea and vomiting.  Endocrine: Negative for polydipsia and polyuria.  Genitourinary:  Negative for dysuria and hematuria.  Musculoskeletal:  Negative for neck pain and neck stiffness.  Skin:  Negative for rash.  Neurological:  Positive for numbness (R hand). Negative for dizziness and weakness.  Psychiatric/Behavioral:  Negative for agitation and behavioral problems.       Objective:    Physical Exam Vitals reviewed.  Constitutional:      General: She is not in acute distress.     Appearance: She is not diaphoretic.  HENT:     Head: Normocephalic and atraumatic.     Nose: Nose normal.  Eyes:     General: No scleral icterus.    Extraocular Movements: Extraocular movements intact.  Cardiovascular:     Rate and Rhythm: Normal rate and regular rhythm.     Heart sounds: Normal heart sounds. No murmur heard. Pulmonary:     Breath sounds: Normal breath sounds. No wheezing or rales.  Musculoskeletal:     Cervical back: Neck supple. No tenderness.     Right lower leg: Edema (1+) present.     Left lower leg: Edema (1+) present.  Skin:    General: Skin is warm.     Findings: No rash.     Comments: About 2 cm in diameter cystic mass in lower cervical spine area, nontender without surrounding erythema  Neurological:     General: No focal deficit present.     Mental Status: She is alert and oriented to person, place, and time.  Psychiatric:        Mood and Affect: Mood normal.        Behavior: Behavior normal.     BP 138/64 (BP Location: Left Arm)   Pulse 94   Ht 5' (1.524 m)   Wt 130 lb 6.4 oz (59.1 kg)   SpO2 97%   BMI 25.47 kg/m  Wt Readings from Last 3 Encounters:  08/09/23 130 lb 6.4 oz (59.1 kg)  06/02/23 128 lb 3.2 oz (58.2 kg)  03/31/23 129 lb (58.5 kg)    Lab Results  Component Value Date   TSH 0.964 08/09/2023   Lab Results  Component Value Date   WBC 8.9  08/09/2023   HGB 11.4 08/09/2023   HCT 36.4 08/09/2023   MCV 88 08/09/2023   PLT 288 08/09/2023   Lab Results  Component Value Date   NA 142 08/09/2023   K 4.5 08/09/2023   CO2 21 08/09/2023   GLUCOSE 110 (H) 08/09/2023   BUN 16 08/09/2023   CREATININE 0.77 08/09/2023   BILITOT 0.3 08/09/2023   ALKPHOS 73 08/09/2023   AST 22 08/09/2023   ALT 13 08/09/2023   PROT 7.0 08/09/2023   ALBUMIN  4.1 08/09/2023   CALCIUM 9.3 08/09/2023   ANIONGAP 11 03/31/2023   EGFR 71 08/09/2023   Lab Results  Component Value Date   CHOL 231 (H) 05/04/2021   Lab Results  Component Value Date    HDL 72 05/04/2021   Lab Results  Component Value Date   LDLCALC 135 (H) 05/04/2021   Lab Results  Component Value Date   TRIG 138 05/04/2021   Lab Results  Component Value Date   CHOLHDL 3.2 05/04/2021   Lab Results  Component Value Date   HGBA1C 5.4 08/09/2023      Assessment & Plan:   Problem List Items Addressed This Visit       Cardiovascular and Mediastinum   Essential hypertension - Primary   BP Readings from Last 1 Encounters:  08/09/23 138/64   Well-controlled for her age with amlodipine  5 mg QD Counseled for compliance with the medications Advised DASH diet      Relevant Orders   CBC with Differential/Platelet (Completed)   CMP14+EGFR (Completed)   TSH (Completed)     Nervous and Auditory   Carpal tunnel syndrome of right wrist   Has chronic numbness and tingling of the right hand Advised to use wrist brace Has tried gabapentin , but had dizziness with it Considering her age, would avoid any surgical intervention - she also prefers to avoid any procedure        Musculoskeletal and Integument   Epidermoid cyst of skin of back   Reports chronic soft tissue mass over upper back area, likely epidermal cyst Reassured it being benign If any increase in size or gets inflamed, will refer to dermatology      Benign mole   Has benign mole over dorsum of right foot, chronic - reassured it being benign        Other   Bilateral leg edema   Has chronic leg swelling, likely multifactorial - chronic vascular insufficiency, HFpEF and lack of mobility Advised to perform leg elevation and use compression socks as tolerated Would avoid diuretic for now       Other Visit Diagnoses       Hyperglycemia       Relevant Orders   Hemoglobin A1c (Completed)       No orders of the defined types were placed in this encounter.   Follow-up: Return in about 6 months (around 02/09/2024) for HTN.    Suzzane MARLA Blanch, MD

## 2023-08-10 ENCOUNTER — Ambulatory Visit: Payer: Self-pay | Admitting: Internal Medicine

## 2023-08-10 LAB — CBC WITH DIFFERENTIAL/PLATELET
Basophils Absolute: 0 x10E3/uL (ref 0.0–0.2)
Basos: 1 %
EOS (ABSOLUTE): 0.2 x10E3/uL (ref 0.0–0.4)
Eos: 2 %
Hematocrit: 36.4 % (ref 34.0–46.6)
Hemoglobin: 11.4 g/dL (ref 11.1–15.9)
Immature Grans (Abs): 0 x10E3/uL (ref 0.0–0.1)
Immature Granulocytes: 0 %
Lymphocytes Absolute: 1.7 x10E3/uL (ref 0.7–3.1)
Lymphs: 20 %
MCH: 27.6 pg (ref 26.6–33.0)
MCHC: 31.3 g/dL — ABNORMAL LOW (ref 31.5–35.7)
MCV: 88 fL (ref 79–97)
Monocytes Absolute: 0.7 x10E3/uL (ref 0.1–0.9)
Monocytes: 8 %
Neutrophils Absolute: 6.2 x10E3/uL (ref 1.4–7.0)
Neutrophils: 69 %
Platelets: 288 x10E3/uL (ref 150–450)
RBC: 4.13 x10E6/uL (ref 3.77–5.28)
RDW: 14.3 % (ref 11.7–15.4)
WBC: 8.9 x10E3/uL (ref 3.4–10.8)

## 2023-08-10 LAB — CMP14+EGFR
ALT: 13 IU/L (ref 0–32)
AST: 22 IU/L (ref 0–40)
Albumin: 4.1 g/dL (ref 3.6–4.6)
Alkaline Phosphatase: 73 IU/L (ref 44–121)
BUN/Creatinine Ratio: 21 (ref 12–28)
BUN: 16 mg/dL (ref 10–36)
Bilirubin Total: 0.3 mg/dL (ref 0.0–1.2)
CO2: 21 mmol/L (ref 20–29)
Calcium: 9.3 mg/dL (ref 8.7–10.3)
Chloride: 106 mmol/L (ref 96–106)
Creatinine, Ser: 0.77 mg/dL (ref 0.57–1.00)
Globulin, Total: 2.9 g/dL (ref 1.5–4.5)
Glucose: 110 mg/dL — ABNORMAL HIGH (ref 70–99)
Potassium: 4.5 mmol/L (ref 3.5–5.2)
Sodium: 142 mmol/L (ref 134–144)
Total Protein: 7 g/dL (ref 6.0–8.5)
eGFR: 71 mL/min/1.73 (ref >59)

## 2023-08-10 LAB — HEMOGLOBIN A1C
Est. average glucose Bld gHb Est-mCnc: 108 mg/dL
Hgb A1c MFr Bld: 5.4 % (ref 4.8–5.6)

## 2023-08-10 LAB — TSH: TSH: 0.964 u[IU]/mL (ref 0.450–4.500)

## 2023-08-12 DIAGNOSIS — D229 Melanocytic nevi, unspecified: Secondary | ICD-10-CM | POA: Insufficient documentation

## 2023-08-12 NOTE — Assessment & Plan Note (Signed)
 Reports chronic soft tissue mass over upper back area, likely epidermal cyst Reassured it being benign If any increase in size or gets inflamed, will refer to dermatology

## 2023-08-12 NOTE — Assessment & Plan Note (Signed)
 Has chronic leg swelling, likely multifactorial - chronic vascular insufficiency, HFpEF and lack of mobility Advised to perform leg elevation and use compression socks as tolerated Would avoid diuretic for now

## 2023-08-12 NOTE — Assessment & Plan Note (Signed)
 Has benign mole over dorsum of right foot, chronic - reassured it being benign

## 2023-08-12 NOTE — Assessment & Plan Note (Signed)
 Has chronic numbness and tingling of the right hand Advised to use wrist brace Has tried gabapentin , but had dizziness with it Considering her age, would avoid any surgical intervention - she also prefers to avoid any procedure

## 2023-09-22 ENCOUNTER — Other Ambulatory Visit: Payer: Self-pay | Admitting: Internal Medicine

## 2023-09-22 DIAGNOSIS — I10 Essential (primary) hypertension: Secondary | ICD-10-CM

## 2023-09-22 MED ORDER — AMLODIPINE BESYLATE 10 MG PO TABS: 5.0000 mg | ORAL_TABLET | Freq: Every day | ORAL | 1 refills | Status: AC

## 2023-09-22 NOTE — Telephone Encounter (Signed)
 Copied from CRM [id number]. Topic: Clinical - Medication Refill >> Sep 22, 2023  2:22 PM Selinda RAMAN wrote: Medication: amLODipine  (NORVASC ) 10 MG tablet  Has the patient contacted their pharmacy? Yes   This is the patient's preferred pharmacy:  [facility] [address], [place] - [address] [place] [address] Phone: [phone] Fax: [phone]  Is this the correct pharmacy for this prescription? Yes If no, delete pharmacy and type the correct one.   Has the prescription been filled recently? No  Is the patient out of the medication? Yes she has been without it for several days now and didn't realize she didn't have any refills left  Has the patient been seen for an appointment in the last year OR does the patient have an upcoming appointment? Yes  Can we respond through MyChart? No she would prefer a call or text  Please assist patient further

## 2023-11-23 ENCOUNTER — Ambulatory Visit: Payer: Self-pay | Admitting: Internal Medicine

## 2024-02-07 ENCOUNTER — Ambulatory Visit: Admitting: Internal Medicine
